# Patient Record
Sex: Female | Born: 1970 | Race: White | Hispanic: No | Marital: Married | Smoking: Former smoker
Health system: Southern US, Community
[De-identification: ages and names within clinical notes are randomized; demographics above are authoritative.]

## PROBLEM LIST (undated history)

## (undated) DIAGNOSIS — M545 Low back pain, unspecified: Secondary | ICD-10-CM

## (undated) DIAGNOSIS — M5481 Occipital neuralgia: Secondary | ICD-10-CM

## (undated) DIAGNOSIS — F419 Anxiety disorder, unspecified: Secondary | ICD-10-CM

## (undated) DIAGNOSIS — R0600 Dyspnea, unspecified: Secondary | ICD-10-CM

## (undated) DIAGNOSIS — J329 Chronic sinusitis, unspecified: Secondary | ICD-10-CM

## (undated) DIAGNOSIS — J438 Other emphysema: Secondary | ICD-10-CM

## (undated) DIAGNOSIS — K859 Acute pancreatitis without necrosis or infection, unspecified: Secondary | ICD-10-CM

## (undated) DIAGNOSIS — H9202 Otalgia, left ear: Secondary | ICD-10-CM

## (undated) DIAGNOSIS — R102 Pelvic and perineal pain unspecified side: Secondary | ICD-10-CM

## (undated) DIAGNOSIS — R200 Anesthesia of skin: Secondary | ICD-10-CM

## (undated) DIAGNOSIS — I700 Atherosclerosis of aorta: Secondary | ICD-10-CM

## (undated) DIAGNOSIS — E785 Hyperlipidemia, unspecified: Secondary | ICD-10-CM

## (undated) DIAGNOSIS — K760 Fatty (change of) liver, not elsewhere classified: Secondary | ICD-10-CM

## (undated) DIAGNOSIS — J449 Chronic obstructive pulmonary disease, unspecified: Secondary | ICD-10-CM

## (undated) DIAGNOSIS — J189 Pneumonia, unspecified organism: Secondary | ICD-10-CM

## (undated) DIAGNOSIS — F509 Eating disorder, unspecified: Secondary | ICD-10-CM

## (undated) DIAGNOSIS — F5104 Psychophysiologic insomnia: Secondary | ICD-10-CM

## (undated) DIAGNOSIS — D649 Anemia, unspecified: Secondary | ICD-10-CM

## (undated) DIAGNOSIS — H7293 Unspecified perforation of tympanic membrane, bilateral: Secondary | ICD-10-CM

## (undated) DIAGNOSIS — H538 Other visual disturbances: Secondary | ICD-10-CM

## (undated) DIAGNOSIS — H9190 Unspecified hearing loss, unspecified ear: Secondary | ICD-10-CM

## (undated) DIAGNOSIS — E039 Hypothyroidism, unspecified: Secondary | ICD-10-CM

## (undated) DIAGNOSIS — R011 Cardiac murmur, unspecified: Secondary | ICD-10-CM

## (undated) DIAGNOSIS — E669 Obesity, unspecified: Secondary | ICD-10-CM

## (undated) DIAGNOSIS — I10 Essential (primary) hypertension: Secondary | ICD-10-CM

## (undated) HISTORY — DX: Otalgia, left ear: H92.02

## (undated) HISTORY — DX: Chronic sinusitis, unspecified: J32.9

## (undated) HISTORY — DX: Obesity, unspecified: E66.9

## (undated) HISTORY — PX: DENTAL SURGERY: SHX609

## (undated) HISTORY — PX: WISDOM TOOTH EXTRACTION: SHX21

## (undated) HISTORY — DX: Hyperlipidemia, unspecified: E78.5

## (undated) HISTORY — DX: Other visual disturbances: H53.8

## (undated) HISTORY — DX: Low back pain, unspecified: M54.50

## (undated) HISTORY — DX: Psychophysiologic insomnia: F51.04

## (undated) HISTORY — PX: INNER EAR SURGERY: SHX679

## (undated) HISTORY — PX: TONSILLECTOMY: SUR1361

## (undated) HISTORY — DX: Fatty (change of) liver, not elsewhere classified: K76.0

## (undated) HISTORY — DX: Unspecified perforation of tympanic membrane, bilateral: H72.93

## (undated) HISTORY — PX: ABDOMINAL HYSTERECTOMY: SHX81

## (undated) HISTORY — DX: Occipital neuralgia: M54.81

## (undated) HISTORY — DX: Anesthesia of skin: R20.0

## (undated) HISTORY — DX: Other emphysema: J43.8

## (undated) HISTORY — DX: Atherosclerosis of aorta: I70.0

## (undated) HISTORY — DX: Eating disorder, unspecified: F50.9

## (undated) HISTORY — DX: Hypothyroidism, unspecified: E03.9

## (undated) HISTORY — PX: EAR MEATOPLASTY WITH FULL THICKNESS SKIN GRAFT: SHX6486

## (undated) HISTORY — DX: Anemia, unspecified: D64.9

## (undated) HISTORY — DX: Unspecified hearing loss, unspecified ear: H91.90

## (undated) HISTORY — PX: LAPAROSCOPIC OVARIAN CYSTECTOMY: SUR786

---

## 1998-04-16 ENCOUNTER — Other Ambulatory Visit: Admission: RE | Admit: 1998-04-16 | Discharge: 1998-04-16 | Payer: Self-pay | Admitting: Obstetrics & Gynecology

## 1998-12-05 ENCOUNTER — Ambulatory Visit (HOSPITAL_BASED_OUTPATIENT_CLINIC_OR_DEPARTMENT_OTHER): Admission: RE | Admit: 1998-12-05 | Discharge: 1998-12-05 | Payer: Self-pay

## 1999-05-05 ENCOUNTER — Other Ambulatory Visit: Admission: RE | Admit: 1999-05-05 | Discharge: 1999-05-05 | Payer: Self-pay | Admitting: Obstetrics and Gynecology

## 2011-07-22 ENCOUNTER — Encounter (HOSPITAL_COMMUNITY): Payer: Self-pay | Admitting: Emergency Medicine

## 2011-07-22 ENCOUNTER — Emergency Department (HOSPITAL_COMMUNITY): Payer: No Typology Code available for payment source

## 2011-07-22 ENCOUNTER — Inpatient Hospital Stay (HOSPITAL_COMMUNITY)
Admission: EM | Admit: 2011-07-22 | Discharge: 2011-07-24 | DRG: 195 | Disposition: A | Discharge status: Home / Self Care | Payer: No Typology Code available for payment source | Attending: Internal Medicine | Admitting: Internal Medicine

## 2011-07-22 DIAGNOSIS — Z720 Tobacco use: Secondary | ICD-10-CM | POA: Diagnosis present

## 2011-07-22 DIAGNOSIS — Z23 Encounter for immunization: Secondary | ICD-10-CM

## 2011-07-22 DIAGNOSIS — G8929 Other chronic pain: Secondary | ICD-10-CM

## 2011-07-22 DIAGNOSIS — J189 Pneumonia, unspecified organism: Principal | ICD-10-CM | POA: Diagnosis present

## 2011-07-22 DIAGNOSIS — G43909 Migraine, unspecified, not intractable, without status migrainosus: Secondary | ICD-10-CM | POA: Diagnosis present

## 2011-07-22 DIAGNOSIS — A419 Sepsis, unspecified organism: Secondary | ICD-10-CM

## 2011-07-22 DIAGNOSIS — F411 Generalized anxiety disorder: Secondary | ICD-10-CM | POA: Diagnosis present

## 2011-07-22 DIAGNOSIS — N949 Unspecified condition associated with female genital organs and menstrual cycle: Secondary | ICD-10-CM | POA: Diagnosis present

## 2011-07-22 DIAGNOSIS — E876 Hypokalemia: Secondary | ICD-10-CM | POA: Diagnosis present

## 2011-07-22 HISTORY — DX: Pelvic and perineal pain unspecified side: R10.20

## 2011-07-22 HISTORY — DX: Anxiety disorder, unspecified: F41.9

## 2011-07-22 HISTORY — DX: Pelvic and perineal pain: R10.2

## 2011-07-22 LAB — POCT I-STAT, CHEM 8
Chloride: 108 mEq/L (ref 96–112)
Creatinine, Ser: 0.8 mg/dL (ref 0.50–1.10)
Glucose, Bld: 110 mg/dL — ABNORMAL HIGH (ref 70–99)
Potassium: 3.6 mEq/L (ref 3.5–5.1)

## 2011-07-22 LAB — DIFFERENTIAL
Lymphs Abs: 2.2 10*3/uL (ref 0.7–4.0)
Monocytes Absolute: 1.2 10*3/uL — ABNORMAL HIGH (ref 0.1–1.0)
Monocytes Relative: 6 % (ref 3–12)
Neutro Abs: 16.7 10*3/uL — ABNORMAL HIGH (ref 1.7–7.7)
Neutrophils Relative %: 83 % — ABNORMAL HIGH (ref 43–77)

## 2011-07-22 LAB — CBC
HCT: 39.1 % (ref 36.0–46.0)
Hemoglobin: 13.5 g/dL (ref 12.0–15.0)
MCH: 30 pg (ref 26.0–34.0)
RBC: 4.5 MIL/uL (ref 3.87–5.11)

## 2011-07-22 MED ORDER — ALBUTEROL SULFATE (5 MG/ML) 0.5% IN NEBU
5.0000 mg | INHALATION_SOLUTION | Freq: Once | RESPIRATORY_TRACT | Status: AC
  Administered 2011-07-22: 5 mg via RESPIRATORY_TRACT
  Filled 2011-07-22: qty 1

## 2011-07-22 MED ORDER — MOXIFLOXACIN HCL IN NACL 400 MG/250ML IV SOLN
400.0000 mg | Freq: Once | INTRAVENOUS | Status: AC
  Administered 2011-07-22: 400 mg via INTRAVENOUS
  Filled 2011-07-22: qty 250

## 2011-07-22 MED ORDER — IPRATROPIUM BROMIDE 0.02 % IN SOLN
0.5000 mg | Freq: Once | RESPIRATORY_TRACT | Status: AC
  Administered 2011-07-22: 0.5 mg via RESPIRATORY_TRACT
  Filled 2011-07-22: qty 2.5

## 2011-07-22 MED ORDER — SODIUM CHLORIDE 0.9 % IV BOLUS (SEPSIS)
1000.0000 mL | Freq: Once | INTRAVENOUS | Status: AC
  Administered 2011-07-22: 1000 mL via INTRAVENOUS

## 2011-07-22 NOTE — ED Provider Notes (Signed)
History     CSN: [id number]  Arrival date & time 07/22/11  1905   First MD Initiated Contact with Patient 07/22/11 2258      Chief Complaint  Patient presents with  . Shortness of Breath    (Consider location/radiation/quality/duration/timing/severity/associated sxs/prior treatment) HPI Comments: 41 year old female with a history of migraine disorder and chronic pelvic pain who presents with coughing. She started coughing 3 days ago, gradual in onset, persistent, gradually getting worse and associated with thick phlegm production. She does have associated bilateral sharp chest pains when she coughs and a headache when she coughs. She has had several episodes of vomiting throughout the day, fever to 101 and presented initially to an urgent care where an x-ray was done showing pneumonia. She was referred here for further evaluation workup and treatment. According to the patient she has a normal immune system, no history of HIV, steroid use or frequent infections.  Patient is a 42 y.o. female presenting with shortness of breath. The history is provided by the patient, the spouse and medical records.  Shortness of Breath  Associated symptoms include shortness of breath.    Past Medical History  Diagnosis Date  . Anxiety   . Migraine   . Pelvic pain     Past Surgical History  Procedure Date  . Abdominal hysterectomy     History reviewed. No pertinent family history.  History  Substance Use Topics  . Smoking status: Former Games developer  . Smokeless tobacco: Not on file  . Alcohol Use: No    OB History    Grav Para Term Preterm Abortions TAB SAB Ect Mult Living                  Review of Systems  Respiratory: Positive for shortness of breath.   All other systems reviewed and are negative.    Allergies  Penicillins  Home Medications   Current Outpatient Rx  Name Route Sig Dispense Refill  . CLONAZEPAM 0.5 MG PO TABS Oral Take 0.5 mg by mouth 3 (three) times daily as  needed. For anxiety    . DULOXETINE HCL 60 MG PO CPEP Oral Take 60 mg by mouth daily.    Marland Kitchen GABAPENTIN 600 MG PO TABS Oral Take 1,200 mg by mouth 3 (three) times daily.    Marland Kitchen HYDROCODONE-ACETAMINOPHEN 7.5-750 MG PO TABS Oral Take 1 tablet by mouth 2 (two) times daily.    . IMIPRAMINE PAMOATE 100 MG PO CAPS Oral Take 100 mg by mouth 2 (two) times daily.    Marland Kitchen OLANZAPINE 10 MG PO TABS Oral Take 10 mg by mouth daily as needed.    Marland Kitchen ZONISAMIDE 100 MG PO CAPS Oral Take 200 mg by mouth every evening.      BP 126/75  Pulse 109  Temp(Src) 99.3 F (37.4 C) (Oral)  Resp 33  SpO2 95%  Physical Exam  Nursing note and vitals reviewed. Constitutional: She appears well-developed and well-nourished. No distress.  HENT:  Head: Normocephalic and atraumatic.  Mouth/Throat: Oropharynx is clear and moist. No oropharyngeal exudate.  Eyes: Conjunctivae and EOM are normal. Pupils are equal, round, and reactive to light. Right eye exhibits no discharge. Left eye exhibits no discharge. No scleral icterus.  Neck: Normal range of motion. Neck supple. No JVD present. No thyromegaly present.  Cardiovascular: Normal rate, regular rhythm, normal heart sounds and intact distal pulses.  Exam reveals no gallop and no friction rub.   No murmur heard. Pulmonary/Chest: She has wheezes. She has  rales.       Respiratory rate 25 on evaluation, wheezing diffusely bilateral lungs on expiration, speaks in full sentences, unable to breathe deeply without significant coughing  Abdominal: Soft. Bowel sounds are normal. She exhibits no distension and no mass. There is no tenderness.  Musculoskeletal: Normal range of motion. She exhibits no edema and no tenderness.  Lymphadenopathy:    She has no cervical adenopathy.  Neurological: She is alert. Coordination normal.  Skin: Skin is warm and dry. No rash noted. No erythema.  Psychiatric: She has a normal mood and affect. Her behavior is normal.    ED Course  Procedures (including  critical care time)  Labs Reviewed  CBC - Abnormal; Notable for the following:    WBC 20.2 (*)    All other components within normal limits  DIFFERENTIAL - Abnormal; Notable for the following:    Neutrophils Relative 83 (*)    Neutro Abs 16.7 (*)    Lymphocytes Relative 11 (*)    Monocytes Absolute 1.2 (*)    All other components within normal limits  POCT I-STAT, CHEM 8 - Abnormal; Notable for the following:    BUN <3 (*)    Glucose, Bld 110 (*)    All other components within normal limits   Dg Chest 2 View  07/22/2011  *RADIOLOGY REPORT*  Clinical Data:   Dizziness and shortness of breath for 3 days  CHEST - 2 VIEW  Comparison: None.  Findings: Bilateral airspace disease is present, widespread, consistent with pneumonia.  Heart size is normal.  There is no effusion or pneumothorax.  The bones are unremarkable.  IMPRESSION: Widespread bilateral airspace disease consistent with pneumonia.  Original Report Authenticated By: Elsie Stain, M.D.     1. CAP (community acquired pneumonia)   2. Sepsis       MDM  Oxygen patient is 93% on room air, respirations showed tachypnea, tachycardia to 110, fever at home of 101. Will proceed with laboratory workup.  Mental status normal - O2 levels low at 92%  Chest x-ray according to my interpretation shows bilateral infiltrates consistent with multifocal pneumonia. Avelox has been ordered prior to my arrival to shift.   Patient has been reevaluated several times and after albuterol treatment has persistent hypoxia with oxygen levels of 90-93%. Her respiratory rate is persistently elevated at 24-26 and her white blood cell count has now come back at 20,000 with significant left shift. Due to her symptom complex of fever, leukocytosis, tachycardiac, hypoxia and presence of infiltrates on her chest x-ray she does meet criteria for sepsis though does appear to be early and does not appear to be in shock. IV fluids ordered, intravenous antibiotics of  moxifloxacin, consultation with hospitalist was obtained and additional orders given.  CRITICAL CARE Performed by: Vida Roller   Total critical care time: 35  Critical care time was exclusive of separately billable procedures and treating other patients.  Critical care was necessary to treat or prevent imminent or life-threatening deterioration.  Critical care was time spent personally by me on the following activities: development of treatment plan with patient and/or surrogate as well as nursing, discussions with consultants, evaluation of patient's response to treatment, examination of patient, obtaining history from patient or surrogate, ordering and performing treatments and interventions, ordering and review of laboratory studies, ordering and review of radiographic studies, pulse oximetry and re-evaluation of patient's condition.      Vida Roller, MD 07/23/11 [phone]

## 2011-07-22 NOTE — ED Notes (Addendum)
Pt was seen at an urgent care and had a chest x-ray done which showed bilateral infiltrates. Pt noted to have some SOB with O2 sats of 93% on RA. Pt is speaking full sentences without difficulty. Pt has CD of CXR with her from urgent care.

## 2011-07-23 ENCOUNTER — Encounter (HOSPITAL_COMMUNITY): Payer: Self-pay | Admitting: Internal Medicine

## 2011-07-23 DIAGNOSIS — Z720 Tobacco use: Secondary | ICD-10-CM | POA: Diagnosis present

## 2011-07-23 DIAGNOSIS — J189 Pneumonia, unspecified organism: Secondary | ICD-10-CM | POA: Diagnosis present

## 2011-07-23 LAB — CBC
MCH: 29.3 pg (ref 26.0–34.0)
MCV: 86.2 fL (ref 78.0–100.0)
Platelets: 270 10*3/uL (ref 150–400)
RDW: 14.1 % (ref 11.5–15.5)

## 2011-07-23 LAB — COMPREHENSIVE METABOLIC PANEL
ALT: 16 U/L (ref 0–35)
AST: 19 U/L (ref 0–37)
Albumin: 3.3 g/dL — ABNORMAL LOW (ref 3.5–5.2)
Alkaline Phosphatase: 106 U/L (ref 39–117)
Chloride: 109 mEq/L (ref 96–112)
Potassium: 3.2 mEq/L — ABNORMAL LOW (ref 3.5–5.1)
Total Bilirubin: 0.2 mg/dL — ABNORMAL LOW (ref 0.3–1.2)

## 2011-07-23 LAB — INFLUENZA PANEL BY PCR (TYPE A & B): H1N1 flu by pcr: NOT DETECTED

## 2011-07-23 LAB — STREP PNEUMONIAE URINARY ANTIGEN: Strep Pneumo Urinary Antigen: POSITIVE — AB

## 2011-07-23 LAB — DIFFERENTIAL
Basophils Absolute: 0 10*3/uL (ref 0.0–0.1)
Eosinophils Absolute: 0 10*3/uL (ref 0.0–0.7)
Eosinophils Relative: 0 % (ref 0–5)

## 2011-07-23 LAB — MRSA PCR SCREENING: MRSA by PCR: NEGATIVE

## 2011-07-23 MED ORDER — SODIUM CHLORIDE 0.9 % IJ SOLN
3.0000 mL | Freq: Two times a day (BID) | INTRAMUSCULAR | Status: DC
  Administered 2011-07-23 (×2): 3 mL via INTRAVENOUS

## 2011-07-23 MED ORDER — GABAPENTIN 600 MG PO TABS
1200.0000 mg | ORAL_TABLET | Freq: Three times a day (TID) | ORAL | Status: DC
  Administered 2011-07-23 – 2011-07-24 (×4): 1200 mg via ORAL
  Filled 2011-07-23 (×6): qty 2

## 2011-07-23 MED ORDER — ACETAMINOPHEN 325 MG PO TABS: 650.0000 mg | ORAL_TABLET | Freq: Four times a day (QID) | ORAL | Status: DC | PRN

## 2011-07-23 MED ORDER — CLONAZEPAM 0.5 MG PO TABS: 0.5000 mg | ORAL_TABLET | Freq: Three times a day (TID) | ORAL | Status: DC | PRN

## 2011-07-23 MED ORDER — ENOXAPARIN SODIUM 40 MG/0.4ML ~~LOC~~ SOLN
40.0000 mg | SUBCUTANEOUS | Status: DC
  Administered 2011-07-23: 40 mg via SUBCUTANEOUS
  Filled 2011-07-23 (×2): qty 0.4

## 2011-07-23 MED ORDER — ACETAMINOPHEN 650 MG RE SUPP: 650.0000 mg | Freq: Four times a day (QID) | RECTAL | Status: DC | PRN

## 2011-07-23 MED ORDER — INFLUENZA VIRUS VACC SPLIT PF IM SUSP
0.5000 mL | INTRAMUSCULAR | Status: AC
  Administered 2011-07-24: 0.5 mL via INTRAMUSCULAR
  Filled 2011-07-23: qty 0.5

## 2011-07-23 MED ORDER — SODIUM CHLORIDE 0.9 % IV SOLN
INTRAVENOUS | Status: DC
  Administered 2011-07-23 (×2): via INTRAVENOUS

## 2011-07-23 MED ORDER — POTASSIUM CHLORIDE CRYS ER 20 MEQ PO TBCR
40.0000 meq | EXTENDED_RELEASE_TABLET | Freq: Once | ORAL | Status: AC
  Administered 2011-07-23: 40 meq via ORAL
  Filled 2011-07-23: qty 2

## 2011-07-23 MED ORDER — IMIPRAMINE PAMOATE 100 MG PO CAPS
100.0000 mg | ORAL_CAPSULE | Freq: Two times a day (BID) | ORAL | Status: DC
  Administered 2011-07-23 – 2011-07-24 (×3): 100 mg via ORAL
  Filled 2011-07-23 (×4): qty 1

## 2011-07-23 MED ORDER — DULOXETINE HCL 60 MG PO CPEP
60.0000 mg | ORAL_CAPSULE | Freq: Every day | ORAL | Status: DC
  Administered 2011-07-23 – 2011-07-24 (×2): 60 mg via ORAL
  Filled 2011-07-23 (×2): qty 1

## 2011-07-23 MED ORDER — ALBUTEROL SULFATE (5 MG/ML) 0.5% IN NEBU
2.5000 mg | INHALATION_SOLUTION | Freq: Four times a day (QID) | RESPIRATORY_TRACT | Status: DC
  Administered 2011-07-23 (×2): 2.5 mg via RESPIRATORY_TRACT
  Filled 2011-07-23 (×2): qty 0.5

## 2011-07-23 MED ORDER — ONDANSETRON HCL 4 MG PO TABS: 4.0000 mg | ORAL_TABLET | Freq: Four times a day (QID) | ORAL | Status: DC | PRN

## 2011-07-23 MED ORDER — MOXIFLOXACIN HCL IN NACL 400 MG/250ML IV SOLN
400.0000 mg | INTRAVENOUS | Status: DC
  Administered 2011-07-23: 400 mg via INTRAVENOUS
  Filled 2011-07-23 (×2): qty 250

## 2011-07-23 MED ORDER — ZONISAMIDE 100 MG PO CAPS
200.0000 mg | ORAL_CAPSULE | Freq: Every evening | ORAL | Status: DC
  Administered 2011-07-23: 200 mg via ORAL
  Filled 2011-07-23 (×3): qty 2

## 2011-07-23 MED ORDER — HYDROCODONE-ACETAMINOPHEN 5-325 MG PO TABS
1.5000 | ORAL_TABLET | Freq: Four times a day (QID) | ORAL | Status: DC | PRN
  Administered 2011-07-23 – 2011-07-24 (×3): 1.5 via ORAL
  Filled 2011-07-23 (×3): qty 2

## 2011-07-23 MED ORDER — ONDANSETRON HCL 4 MG/2ML IJ SOLN: 4.0000 mg | Freq: Four times a day (QID) | INTRAMUSCULAR | Status: DC | PRN

## 2011-07-23 MED ORDER — ALBUTEROL SULFATE (5 MG/ML) 0.5% IN NEBU
2.5000 mg | INHALATION_SOLUTION | RESPIRATORY_TRACT | Status: DC | PRN
  Administered 2011-07-23: 2.5 mg via RESPIRATORY_TRACT
  Filled 2011-07-23: qty 0.5

## 2011-07-23 MED ORDER — OLANZAPINE 10 MG PO TABS
10.0000 mg | ORAL_TABLET | Freq: Every day | ORAL | Status: DC | PRN
  Filled 2011-07-23: qty 1

## 2011-07-23 MED ORDER — PNEUMOCOCCAL VAC POLYVALENT 25 MCG/0.5ML IJ INJ
0.5000 mL | INJECTION | INTRAMUSCULAR | Status: AC
  Administered 2011-07-24: 0.5 mL via INTRAMUSCULAR
  Filled 2011-07-23: qty 0.5

## 2011-07-23 NOTE — Progress Notes (Signed)
Utilization review completed.  

## 2011-07-23 NOTE — Progress Notes (Signed)
Report given to receiving RN and pt transferred to 5500. VSS. Pt alert and oriented x4. Kendra Parker

## 2011-07-23 NOTE — H&P (Signed)
Kendra Parker is an 41 y.o. female.   PCP - None Follows at [facility] for Chronic pelvic pain. Chief Complaint: Shortness of breath and cough. HPI: 41 year-old female with history of chronic pelvic pain and migraine and ongoing tobacco abuse presented to the ER because of shortness of breath with cough over last 3 days which was worsening. In the ER patient was found to be short of breath tachypneic and chest x-ray showed bilateral infiltrates. At this time patient will be admitted for pneumonia. Patient has some pleuritic chest pain when she takes deep breath or coughs denies any nausea vomiting abdominal pain diarrhea dizziness or loss of consciousness. Patient has not had pneumonia in the recent past. Her colleague at work place also has bronchitis diagnosed 3 days ago.  Past Medical History  Diagnosis Date  . Anxiety   . Migraine   . Pelvic pain     Past Surgical History  Procedure Date  . Abdominal hysterectomy     Family History  Problem Relation Age of Onset  . Multiple myeloma Mother    Social History:  reports that she has quit smoking. She does not have any smokeless tobacco history on file. She reports that she does not drink alcohol or use illicit drugs.  Allergies:  Allergies  Allergen Reactions  . Penicillins Rash    Medications Prior to Admission  Medication Dose Route Frequency Provider Last Rate Last Dose  . albuterol (PROVENTIL) (5 MG/ML) 0.5% nebulizer solution 5 mg  5 mg Nebulization Once Harrold Donath R. Pickering, MD   5 mg at 07/22/11 2307  . ipratropium (ATROVENT) nebulizer solution 0.5 mg  0.5 mg Nebulization Once Harrold Donath R. Pickering, MD   0.5 mg at 07/22/11 2307  . moxifloxacin (AVELOX) IVPB 400 mg  400 mg Intravenous Once [employer]. Pickering, MD   400 mg at 07/22/11 2317  . sodium chloride 0.9 % bolus 1,000 mL  1,000 mL Intravenous Once Harrold Donath R. Pickering, MD   1,000 mL at 07/22/11 2317   No current outpatient prescriptions on file as of 07/22/2011.    Results for  orders placed during the hospital encounter of 07/22/11 (from the past 48 hour(s))  CBC     Status: Abnormal   Collection Time   07/22/11 10:51 PM      Component Value Range Comment   WBC 20.2 (*) 4.0 - 10.5 (K/uL)    RBC 4.50  3.87 - 5.11 (MIL/uL)    Hemoglobin 13.5  12.0 - 15.0 (g/dL)    HCT 91.4  78.2 - 95.6 (%)    MCV 86.9  78.0 - 100.0 (fL)    MCH 30.0  26.0 - 34.0 (pg)    MCHC 34.5  30.0 - 36.0 (g/dL)    RDW 21.3  08.6 - 57.8 (%)    Platelets 284  150 - 400 (K/uL)   DIFFERENTIAL     Status: Abnormal   Collection Time   07/22/11 10:51 PM      Component Value Range Comment   Neutrophils Relative 83 (*) 43 - 77 (%)    Neutro Abs 16.7 (*) 1.7 - 7.7 (K/uL)    Lymphocytes Relative 11 (*) 12 - 46 (%)    Lymphs Abs 2.2  0.7 - 4.0 (K/uL)    Monocytes Relative 6  3 - 12 (%)    Monocytes Absolute 1.2 (*) 0.1 - 1.0 (K/uL)    Eosinophils Relative 0  0 - 5 (%)    Eosinophils Absolute 0.0  0.0 - 0.7 (K/uL)    Basophils Relative 0  0 - 1 (%)    Basophils Absolute 0.0  0.0 - 0.1 (K/uL)   POCT I-STAT, CHEM 8     Status: Abnormal   Collection Time   07/22/11 11:02 PM      Component Value Range Comment   Sodium 139  135 - 145 (mEq/L)    Potassium 3.6  3.5 - 5.1 (mEq/L)    Chloride 108  96 - 112 (mEq/L)    BUN <3 (*) 6 - 23 (mg/dL)    Creatinine, Ser 5.40  0.50 - 1.10 (mg/dL)    Glucose, Bld 981 (*) 70 - 99 (mg/dL)    Calcium, Ion 1.91  1.12 - 1.32 (mmol/L)    TCO2 19  0 - 100 (mmol/L)    Hemoglobin 13.9  12.0 - 15.0 (g/dL)    HCT 47.8  29.5 - 62.1 (%)    Dg Chest 2 View  07/22/2011  *RADIOLOGY REPORT*  Clinical Data:   Dizziness and shortness of breath for 3 days  CHEST - 2 VIEW  Comparison: None.  Findings: Bilateral airspace disease is present, widespread, consistent with pneumonia.  Heart size is normal.  There is no effusion or pneumothorax.  The bones are unremarkable.  IMPRESSION: Widespread bilateral airspace disease consistent with pneumonia.  Original Report Authenticated By: Elsie Stain, M.D.    Review of Systems  Constitutional: Negative.   HENT: Negative.   Eyes: Negative.   Respiratory: Positive for cough and shortness of breath.   Cardiovascular: Negative.   Gastrointestinal: Negative.   Genitourinary: Negative.   Musculoskeletal: Negative.   Skin: Negative.   Neurological: Negative.   Endo/Heme/Allergies: Negative.   Psychiatric/Behavioral: Negative.     Blood pressure 126/75, pulse 109, temperature 99.3 F (37.4 C), temperature source Oral, resp. rate 33, SpO2 95.00%. Physical Exam  Constitutional: She is oriented to person, place, and time. She appears well-developed and well-nourished.  HENT:  Head: Normocephalic and atraumatic.  Right Ear: External ear normal.  Left Ear: External ear normal.  Nose: Nose normal.  Mouth/Throat: Oropharynx is clear and moist. No oropharyngeal exudate.  Eyes: Conjunctivae are normal. Pupils are equal, round, and reactive to light. Right eye exhibits no discharge. Left eye exhibits no discharge. No scleral icterus.  Neck: Normal range of motion. Neck supple.  Cardiovascular: Normal rate, regular rhythm and normal heart sounds.   Respiratory: Effort normal. She has wheezes. She has rales.  GI: Soft. Bowel sounds are normal. She exhibits no distension. There is no tenderness. There is no rebound.  Musculoskeletal: Normal range of motion. She exhibits no edema and no tenderness.  Neurological: She is alert and oriented to person, place, and time. She has normal reflexes.  Skin: Skin is warm and dry.  Psychiatric: Her behavior is normal.     Assessment/Plan #1. Pneumonia - as patient is taking shallow breaths and tachypneic we will observe her in step down overnight. Treat with Avelox as community-acquired pneumonia. Check for flu. #2. Tobacco abuse - tobacco cessation counseling requested. #3. History of migraine and chronic pelvic pain and anxiety - continue present medications.  CODE STATUS - full  code.  Clayton Bosserman N. 07/23/2011, 1:48 AM

## 2011-07-23 NOTE — Progress Notes (Signed)
FLU PCR NEGATIVE, DROPLET ISOLATION D/CED.

## 2011-07-23 NOTE — Progress Notes (Addendum)
Subjective:   Patient seen and examined this morning. informs her breathing to be better.   Objective:  Vital signs in last 24 hours:  Filed Vitals:   07/23/11 0800 07/23/11 1200 07/23/11 1218 07/23/11 1222  BP: 135/96 141/88    Pulse: 94 91    Temp:    97.4 F (36.3 C)  TempSrc:    Oral  Resp: 26 24    Height:      Weight:      SpO2: 94% 94% 99%     Intake/Output from previous day:   Intake/Output Summary (Last 24 hours) at 07/23/11 1429 Last data filed at 07/23/11 1000  Gross per 24 hour  Intake 711.67 ml  Output    300 ml  Net 411.67 ml    Physical Exam:  General: middle aged female  in no acute distress. HEENT: no pallor, no icterus, moist oral mucosa, no JVD, no lymphadenopathy Heart: Normal  s1 &s2  Regular rate and rhythm, without murmurs, rubs, gallops. Lungs: equal air entry  Bilaterally, scattered ronchi Abdomen: Soft, nontender, nondistended, positive bowel sounds. Extremities: No clubbing cyanosis or edema with positive pedal pulses. Neuro: Alert, awake, oriented x3, nonfocal.   Lab Results:  Basic Metabolic Panel:    Component Value Date/Time   NA 137 07/23/2011 0625   K 3.2* 07/23/2011 0625   CL 109 07/23/2011 0625   CO2 17* 07/23/2011 0625   BUN 4* 07/23/2011 0625   CREATININE 0.59 07/23/2011 0625   GLUCOSE 103* 07/23/2011 0625   CALCIUM 9.2 07/23/2011 0625   CBC:    Component Value Date/Time   WBC 15.8* 07/23/2011 0625   HGB 12.1 07/23/2011 0625   HCT 35.6* 07/23/2011 0625   PLT 270 07/23/2011 0625   MCV 86.2 07/23/2011 0625   NEUTROABS 12.8* 07/23/2011 0625   LYMPHSABS 2.0 07/23/2011 0625   MONOABS 1.0 07/23/2011 0625   EOSABS 0.0 07/23/2011 0625   BASOSABS 0.0 07/23/2011 0625    Recent Results (from the past 240 hour(s))  MRSA PCR SCREENING     Status: Normal   Collection Time   07/23/11  3:06 AM      Component Value Range Status Comment   MRSA by PCR NEGATIVE  NEGATIVE  Final     Studies/Results: Dg Chest 2 View  07/22/2011  *RADIOLOGY REPORT*  Clinical  Data:   Dizziness and shortness of breath for 3 days  CHEST - 2 VIEW  Comparison: None.  Findings: Bilateral airspace disease is present, widespread, consistent with pneumonia.  Heart size is normal.  There is no effusion or pneumothorax.  The bones are unremarkable.  IMPRESSION: Widespread bilateral airspace disease consistent with pneumonia.  Original Report Authenticated By: Elsie Stain, M.D.    Medications: Scheduled Meds:   . albuterol  2.5 mg Nebulization Q6H  . albuterol  5 mg Nebulization Once  . DULoxetine  60 mg Oral Daily  . enoxaparin  40 mg Subcutaneous Q24H  . gabapentin  1,200 mg Oral TID  . imipramine  100 mg Oral BID  . influenza  inactive virus vaccine  0.5 mL Intramuscular Tomorrow-1000  . ipratropium  0.5 mg Nebulization Once  . moxifloxacin  400 mg Intravenous Once  . moxifloxacin  400 mg Intravenous Q24H  . pneumococcal 23 valent vaccine  0.5 mL Intramuscular Tomorrow-1000  . potassium chloride  40 mEq Oral Once  . sodium chloride  1,000 mL Intravenous Once  . sodium chloride  3 mL Intravenous Q12H  . zonisamide  200 mg Oral QPM   Continuous Infusions:   . sodium chloride 50 mL/hr at 07/23/11 0410   PRN Meds:.acetaminophen, acetaminophen, albuterol, clonazePAM, HYDROcodone-acetaminophen, OLANZapine, ondansetron (ZOFRAN) IV, ondansetron  Assessment/ 41 y/o female with hx of active smoking admitted with pneumonia.       Plan:  *Community acquired strep pneumonia Urine strep ag positive patient on avelox and will continue ( day 1)  Cont prn nebs  rapid flu negative Can transfer out to medical floor  Hypokalemia Replenished   Tobacco abuse Counseled on smoking cessation  Diet:  Regular  DVT prophylaxis  Full code   LOS: 1 day   Kendra Parker 07/23/2011, 2:29 PM

## 2011-07-24 ENCOUNTER — Encounter (HOSPITAL_COMMUNITY): Payer: Self-pay | Admitting: Emergency Medicine

## 2011-07-24 DIAGNOSIS — G8929 Other chronic pain: Secondary | ICD-10-CM

## 2011-07-24 DIAGNOSIS — R102 Pelvic and perineal pain: Secondary | ICD-10-CM

## 2011-07-24 DIAGNOSIS — G43909 Migraine, unspecified, not intractable, without status migrainosus: Secondary | ICD-10-CM | POA: Diagnosis present

## 2011-07-24 LAB — CBC
HCT: 36 % (ref 36.0–46.0)
Hemoglobin: 11.9 g/dL — ABNORMAL LOW (ref 12.0–15.0)
MCHC: 33.1 g/dL (ref 30.0–36.0)
MCV: 87.6 fL (ref 78.0–100.0)
RDW: 14.4 % (ref 11.5–15.5)

## 2011-07-24 MED ORDER — MOXIFLOXACIN HCL 400 MG PO TABS: 400.0000 mg | ORAL_TABLET | Freq: Every day | ORAL | Status: AC

## 2011-07-24 MED ORDER — ALBUTEROL SULFATE (5 MG/ML) 0.5% IN NEBU: 2.5000 mg | INHALATION_SOLUTION | Freq: Four times a day (QID) | RESPIRATORY_TRACT | Status: DC | PRN

## 2011-07-24 NOTE — Progress Notes (Signed)
[name] discharged Home per MD order.  Discharge instructions reviewed and discussed with the patient, all questions and concerns answered. Copy of instructions and scripts given to patient.   Kendra Parker, Kendra Parker  Home Medication Instructions [id number]   Printed on:07/24/11 1438  Medication Information                    HYDROcodone-acetaminophen (VICODIN ES) 7.5-750 MG per tablet Take 1 tablet by mouth 2 (two) times daily.           clonazePAM (KLONOPIN) 0.5 MG tablet Take 0.5 mg by mouth 3 (three) times daily as needed. For anxiety           gabapentin (NEURONTIN) 600 MG tablet Take 1,200 mg by mouth 3 (three) times daily.           imipramine (TOFRANIL-PM) 100 MG capsule Take 100 mg by mouth 2 (two) times daily.           DULoxetine (CYMBALTA) 60 MG capsule Take 60 mg by mouth daily.           OLANZapine (ZYPREXA) 10 MG tablet Take 10 mg by mouth daily as needed.           zonisamide (ZONEGRAN) 100 MG capsule Take 200 mg by mouth every evening.           QUEtiapine (SEROQUEL) 50 MG tablet Take 50-150 mg by mouth at bedtime as needed.           imipramine (TOFRANIL) 50 MG tablet Take 100 mg by mouth at bedtime.           escitalopram (LEXAPRO) 10 MG tablet Take 20 mg by mouth daily.           OXcarbazepine (TRILEPTAL) 150 MG tablet Take 150 mg by mouth 2 (two) times daily.           moxifloxacin (AVELOX) 400 MG tablet Take 1 tablet (400 mg total) by mouth daily.             Patients skin is clean, dry and intact, no evidence of skin break down. IV site discontinued and catheter remains intact. Site without signs and symptoms of complications. Dressing and pressure applied.  Patient escorted to car by NT in a wheelchair,  no distress noted upon discharge.  [name] [facility] 07/24/2011 2:38 PM

## 2011-07-24 NOTE — Discharge Summary (Signed)
Patient ID: Kendra Parker MRN: [id number] DOB/AGE: 43-Jul-1972 43 y.o.  Admit date: 07/22/2011 Discharge date: 07/24/2011  Primary Care Physician:  Mady Gemma, [place], PA-C  Discharge Diagnoses:     Principal Problem:  *Community acquired strep pneumonia  Active Problems:  Tobacco abuse  Migraine  Chronic pelvic pain in female   Medication List  As of 07/24/2011 11:44 AM   TAKE these medications         clonazePAM 0.5 MG tablet   Commonly known as: KLONOPIN   Take 0.5 mg by mouth 3 (three) times daily as needed. For anxiety      DULoxetine 60 MG capsule   Commonly known as: CYMBALTA   Take 60 mg by mouth daily.      escitalopram 10 MG tablet   Commonly known as: LEXAPRO   Take 20 mg by mouth daily.      gabapentin 600 MG tablet   Commonly known as: NEURONTIN   Take 1,200 mg by mouth 3 (three) times daily.      HYDROcodone-acetaminophen 7.5-750 MG per tablet   Commonly known as: VICODIN ES   Take 1 tablet by mouth 2 (two) times daily.      imipramine 100 MG capsule   Commonly known as: TOFRANIL-PM   Take 100 mg by mouth 2 (two) times daily.      imipramine 50 MG tablet   Commonly known as: TOFRANIL   Take 100 mg by mouth at bedtime.      moxifloxacin 400 MG tablet   Commonly known as: AVELOX   Take 1 tablet (400 mg total) by mouth daily.      OLANZapine 10 MG tablet   Commonly known as: ZYPREXA   Take 10 mg by mouth daily as needed.      OXcarbazepine 150 MG tablet   Commonly known as: TRILEPTAL   Take 150 mg by mouth 2 (two) times daily.      QUEtiapine 50 MG tablet   Commonly known as: SEROQUEL   Take 50-150 mg by mouth at bedtime as needed.      zonisamide 100 MG capsule   Commonly known as: ZONEGRAN   Take 200 mg by mouth every evening.            Disposition and Follow-up:  Follow up with PCP in 1 week  Consults:  none  Significant Diagnostic Studies:  Dg Chest 2 View  07/22/2011  *RADIOLOGY REPORT*  Clinical Data:   Dizziness and  shortness of breath for 3 days  CHEST - 2 VIEW  Comparison: None.  Findings: Bilateral airspace disease is present, widespread, consistent with pneumonia.  Heart size is normal.  There is no effusion or pneumothorax.  The bones are unremarkable.  IMPRESSION: Widespread bilateral airspace disease consistent with pneumonia.  Original Report Authenticated By: Elsie Stain, M.D.    Brief H and P: For complete details please refer to admission H and P, but in brief 41 year-old female with history of chronic pelvic pain and migraine and ongoing tobacco abuse presented to the ER because of shortness of breath with cough over last 3 days which was worsening. In the ER patient was found to be short of breath tachypneic and chest x-ray showed bilateral infiltrates. At this time patient will be admitted for pneumonia. Patient has some pleuritic chest pain when she takes deep breath or coughs denies any nausea vomiting abdominal pain diarrhea dizziness or loss of consciousness. Patient has not had pneumonia in the recent  past. Her colleague at work place also has bronchitis diagnosed 3 days ago.    Physical Exam on Discharge:  Filed Vitals:   07/23/11 2000 07/23/11 2056 07/23/11 2200 07/24/11 0516  BP: 130/91  117/78 114/78  Pulse: 84  96 86  Temp:   98.1 F (36.7 C) 97.3 F (36.3 C)  TempSrc:      Resp: 28  24 22   Height:   5\' 2"  (1.575 m)   Weight:   89 kg (196 lb 3.4 oz)   SpO2: 94% 96% 94% 95%     Intake/Output Summary (Last 24 hours) at 07/24/11 1144 Last data filed at 07/24/11 0900  Gross per 24 hour  Intake 1795.5 ml  Output   1125 ml  Net  670.5 ml    General: Alert, awake, oriented x3, in no acute distress. HEENT: No bruits, no goiter. Heart: Regular rate and rhythm, without murmurs, rubs, gallops. Lungs: Clear to auscultation bilaterally. Abdomen: Soft, nontender, nondistended, positive bowel sounds. Extremities: No clubbing cyanosis or edema with positive pedal pulses. Neuro:  Grossly intact, nonfocal.  CBC:    Component Value Date/Time   WBC 10.5 07/24/2011 0712   HGB 11.9* 07/24/2011 0712   HCT 36.0 07/24/2011 0712   PLT 276 07/24/2011 0712   MCV 87.6 07/24/2011 0712   NEUTROABS 12.8* 07/23/2011 0625   LYMPHSABS 2.0 07/23/2011 0625   MONOABS 1.0 07/23/2011 0625   EOSABS 0.0 07/23/2011 0625   BASOSABS 0.0 07/23/2011 0625    Basic Metabolic Panel:    Component Value Date/Time   NA 137 07/23/2011 0625   K 3.2* 07/23/2011 0625   CL 109 07/23/2011 0625   CO2 17* 07/23/2011 0625   BUN 4* 07/23/2011 0625   CREATININE 0.59 07/23/2011 0625   GLUCOSE 103* 07/23/2011 0625   CALCIUM 9.2 07/23/2011 0625    Hospital Course:  *Community acquired strep pneumonia  Urine strep ag positive  patient started on avelox and given tachypnea and mild tachycardia was monitored in stepdown for some time and transferred to medical floor.  Improved with prn nebs  rapid flu negative   Hypokalemia  Replenished   Tobacco abuse  Counseled on smoking cessation   Hx of migraine   on multiple meds at home which can be continued   Patient clinically stable for discharge with 5 more days of po avelox to complete a 7 day course     Time spent on Discharge: 45 minutes  Signed: Madelon Welsch 07/24/2011, 11:44 AM

## 2011-07-24 NOTE — Discharge Instructions (Addendum)
Pneumonia, Adult Pneumonia is an infection of the lungs. It may be caused by a germ (virus or bacteria). Some types of pneumonia can spread easily from person to person. This can happen when you cough or sneeze. HOME CARE  Only take medicine as told by your doctor.   Take your medicine (antibiotics) as told. Finish it even if you start to feel better.   Do not smoke.   You may use a vaporizer or humidifier in your room. This can help loosen thick spit (mucus).   Sleep so you are almost sitting up (semi-upright). This helps reduce coughing.   Rest.  A shot (vaccine) can help prevent pneumonia. Shots are often advised for:  People over 33 years old.   Patients on chemotherapy.   People with long-term (chronic) lung problems.   People with immune system problems.  GET HELP RIGHT AWAY IF:   You are getting worse.   You cannot control your cough, and you are losing sleep.   You cough up blood.   Your pain gets worse, even with medicine.   You have a fever.   Any of your problems are getting worse, not better.   You have shortness of breath or chest pain.  MAKE SURE YOU:   Understand these instructions.   Will watch your condition.   Will get help right away if you are not doing well or get worse.  Document Released: 10/27/2007 Document Revised: 01/20/2011 Document Reviewed: 07/31/2010 [facility] Patient Information 2012 [place], [place].

## 2011-07-25 LAB — LEGIONELLA ANTIGEN, URINE

## 2012-05-16 ENCOUNTER — Other Ambulatory Visit: Payer: Self-pay | Admitting: *Deleted

## 2015-01-01 ENCOUNTER — Encounter: Payer: Self-pay | Admitting: Neurology

## 2015-01-01 ENCOUNTER — Ambulatory Visit (INDEPENDENT_AMBULATORY_CARE_PROVIDER_SITE_OTHER): Payer: Managed Care, Other (non HMO) | Admitting: Neurology

## 2015-01-01 VITALS — BP 123/81 | HR 69 | Ht 63.0 in | Wt 214.0 lb

## 2015-01-01 DIAGNOSIS — R269 Unspecified abnormalities of gait and mobility: Secondary | ICD-10-CM

## 2015-01-01 DIAGNOSIS — R208 Other disturbances of skin sensation: Secondary | ICD-10-CM | POA: Diagnosis not present

## 2015-01-01 DIAGNOSIS — R200 Anesthesia of skin: Secondary | ICD-10-CM

## 2015-01-01 NOTE — Progress Notes (Signed)
PATIENT: Kendra Parker DOB: [birth year]  Chief Complaint  Patient presents with  . Numbness    She is reporting numbness in her right leg from her knee down to her foot.  She denies having any pain.  She has had a MRI and xray that did not offer any explanation for her symptoms.  She is able to walk unassisted but has to pay very close attention.  She has suffered multiple falls.     HISTORICAL  Kendra Parker is a 44 year old left-handed female, seen in refer by orthopedic surgeon Dr. Ron Agee and primary care physician nurse practitioner Alvester Chou for evaluation of numbness from right leg to her right foot  She has past medical history of migraine, hypertension, hyperlipidemia, chronic pelvic pain, multiple ear surgeries in the past, partial hysterectomy,  Since April 2016, she noticed fairly acute onset onset low back pain, no radiating pain, at the same time, she noticed right leg numbness below her right knee, there was no significant pain, weakness, no gait difficulty initially, no left lower extremity involvement, she denies bilateral upper extremity involvement, no bowel and bladder incontinence. She denies numbness of right upper extremity, or right face  She was evaluated by orthopedic surgeon Dr. Ron Agee in July 2016, I reviewed summarized the note,  MRI of lumbar was done in December 07 2014 at [facility], per record, mild disc bulging at L3-4, L4-5, mild facet arthropathy at L5-S1, no significant foraminal or canal stenosis to explain her lower extremity numbness   REVIEW OF SYSTEMS: Full 14 system review of systems performed and notable only for as above  ALLERGIES: Allergies  Allergen Reactions  . Lamotrigine Rash    Only at 100 mg is intolerable-gets a rash from head to toe  . Penicillins Rash    HOME MEDICATIONS: Current Outpatient Prescriptions  Medication Sig Dispense Refill  . atorvastatin (LIPITOR) 20 MG tablet Take 20 mg by mouth daily.     . Cholecalciferol (VITAMIN D3) 2000 UNITS TABS Take by mouth daily.    . clonazePAM (KLONOPIN) 0.5 MG tablet Take 0.5 mg by mouth 3 (three) times daily as needed. For anxiety    . DULoxetine (CYMBALTA) 60 MG capsule Take 60 mg by mouth daily.    Marland Kitchen JANTOVEN 5 MG tablet daily.    . metoprolol (LOPRESSOR) 50 MG tablet 2 (two) times daily.    Marland Kitchen oxyCODONE (OXY IR/ROXICODONE) 5 MG immediate release tablet 4 (four) times daily as needed.    Marland Kitchen perphenazine (TRILAFON) 4 MG tablet 3 (three) times daily.    . QUEtiapine (SEROQUEL) 50 MG tablet For a good night sleep up to 3 tabs/139m a night    . spironolactone (ALDACTONE) 50 MG tablet Take 50 mg by mouth. Take 0.5 tablet daily.    . SUMAtriptan (IMITREX) 50 MG tablet Take 50 mg by mouth.       PAST MEDICAL HISTORY: Past Medical History  Diagnosis Date  . Anxiety   . Migraine   . Pelvic pain   . Numbness     Left leg    PAST SURGICAL HISTORY: Past Surgical History  Procedure Laterality Date  . Abdominal hysterectomy      Partial  . Inner ear surgery    . Laparoscopic ovarian cystectomy      Left    FAMILY HISTORY: Family History  Problem Relation Age of Onset  . Multiple myeloma Mother   . Breast cancer Mother   . Emphysema Father  SOCIAL HISTORY:  Social History   Social History  . Marital Status: Married    Spouse Name: N/A  . Number of Children: 1  . Years of Education: HS   Occupational History  . Unemployed    Social History Main Topics  . Smoking status: Current Some Day Smoker    Types: Cigarettes, E-cigarettes  . Smokeless tobacco: Not on file     Comment: Smokes 0.25 packs/week - uses vapor  . Alcohol Use: No  . Drug Use: No  . Sexual Activity: Yes   Other Topics Concern  . Not on file   Social History Narrative   Lives at home with her husband.   Left-handed.   24 oz caffeine per day.     PHYSICAL EXAM   Filed Vitals:   01/01/15 1452  BP: 123/81  Pulse: 69  Height: 5' 3"  (1.6 m)    Weight: 214 lb (97.07 kg)    Not recorded      Body mass index is 37.92 kg/(m^2).  PHYSICAL EXAMNIATION:  Gen: NAD, conversant, well nourised, obese, well groomed                     Cardiovascular: Regular rate rhythm, no peripheral edema, warm, nontender. Eyes: Conjunctivae clear without exudates or hemorrhage Neck: Supple, no carotid bruise. Pulmonary: Clear to auscultation bilaterally   NEUROLOGICAL EXAM:  MENTAL STATUS: Speech:    Speech is normal; fluent and spontaneous with normal comprehension.  Cognition:     Orientation to time, place and person     Normal recent and remote memory     Normal Attention span and concentration     Normal Language, naming, repeating,spontaneous speech     Fund of knowledge   CRANIAL NERVES: CN II: Visual fields are full to confrontation. Fundoscopic exam is normal with sharp discs and no vascular changes. Pupils are round equal and briskly reactive to light. CN III, IV, VI: extraocular movement are normal. No ptosis. CN V: Facial sensation is intact to pinprick in all 3 divisions bilaterally. Corneal responses are intact.  CN VII: Face is symmetric with normal eye closure and smile. CN VIII: Hearing is normal to rubbing fingers CN IX, X: Palate elevates symmetrically. Phonation is normal. CN XI: Head turning and shoulder shrug are intact CN XII: Tongue is midline with normal movements and no atrophy.  MOTOR: There is no pronator drift of out-stretched arms. Muscle bulk and tone are normal. Muscle strength is normal.  REFLEXES: Reflexes are 2+ and symmetric at the biceps, triceps, knees, and ankles. Plantar responses are flexor.  SENSORY: Preserved bilateral lower extremity, toes vibratory sensation, proprioception, reported decreased pinprick at right lower extremity  COORDINATION: Rapid alternating movements and fine finger movements are intact. There is no dysmetria on finger-to-nose and heel-knee-shin.     GAIT/STANCE: Posture is normal. Gait is steady with normal steps, base, arm swing, and turning. Heel and toe walking are normal. Tandem gait is normal.  Romberg is absent.  DIAGNOSTIC DATA (LABS, IMAGING, TESTING) - I reviewed patient records, labs, notes, testing and imaging myself where available.  ASSESSMENT AND PLAN  LAKINDRA WIBLE is a 44 y.o. female   Low back pain Right leg paresthesia Frequent falling  There was no significant weakness, well-preserved reflex,  Potential localization are thoracic spine versus peripheral nervous system,  Proceed with EMG nerve conduction study  MRI of thoracic spine    Marcial Pacas, M.D. Ph.D.  [facility] [phone] 3rd  [address] [place], [place] [address] Ph: [phone] Fax: [phone]  CC: To Dr. Jeannetta Nap, Alvester Chou

## 2015-01-15 ENCOUNTER — Ambulatory Visit (INDEPENDENT_AMBULATORY_CARE_PROVIDER_SITE_OTHER): Payer: Managed Care, Other (non HMO)

## 2015-01-15 DIAGNOSIS — R200 Anesthesia of skin: Secondary | ICD-10-CM

## 2015-01-15 DIAGNOSIS — R269 Unspecified abnormalities of gait and mobility: Secondary | ICD-10-CM

## 2015-01-15 DIAGNOSIS — R208 Other disturbances of skin sensation: Secondary | ICD-10-CM

## 2015-01-17 ENCOUNTER — Telehealth: Payer: Self-pay

## 2015-01-17 NOTE — Telephone Encounter (Signed)
LVM for patient to call office to receive MRI results... Ok to inform patient of normal MRI of thoracic

## 2015-01-17 NOTE — Telephone Encounter (Signed)
Pt called back about MRI result... I relayed that MRI was normal per previous telephone note.

## 2015-01-20 ENCOUNTER — Telehealth: Payer: Self-pay | Admitting: *Deleted

## 2015-01-20 NOTE — Telephone Encounter (Signed)
Left message letting patient know her test results were normal.

## 2015-01-20 NOTE — Telephone Encounter (Signed)
-----   Message from Levert Feinstein, MD sent at 01/16/2015  7:11 PM EDT ----- Please call pt for normal MRI thoracic

## 2015-02-10 ENCOUNTER — Ambulatory Visit (INDEPENDENT_AMBULATORY_CARE_PROVIDER_SITE_OTHER): Payer: Self-pay | Admitting: Neurology

## 2015-02-10 ENCOUNTER — Ambulatory Visit (INDEPENDENT_AMBULATORY_CARE_PROVIDER_SITE_OTHER): Payer: Managed Care, Other (non HMO) | Admitting: Neurology

## 2015-02-10 DIAGNOSIS — Z0289 Encounter for other administrative examinations: Secondary | ICD-10-CM

## 2015-02-10 DIAGNOSIS — R200 Anesthesia of skin: Secondary | ICD-10-CM

## 2015-02-10 DIAGNOSIS — M545 Low back pain, unspecified: Secondary | ICD-10-CM

## 2015-02-10 DIAGNOSIS — R269 Unspecified abnormalities of gait and mobility: Secondary | ICD-10-CM

## 2015-02-10 DIAGNOSIS — R208 Other disturbances of skin sensation: Secondary | ICD-10-CM | POA: Diagnosis not present

## 2015-02-10 NOTE — Progress Notes (Signed)
She continues to complain midline low back pain,  Today's electrodiagnostic studies normal, there is no evidence of large fiber peripheral neuropathy, right lower extremity neuropathy, or right lumbosacral radiculopathy.  MRI of thoracic spine is normal  I have suggested her continue back stretching exercise,. NSAIDs, low back pain is most consistent with musculoskeletal etiology,  return to clinic in 2-3 months with nurse practitioner, if there is no significant worsening, may discharge her to primary care physician.

## 2015-02-10 NOTE — Procedures (Signed)
   NCS (NERVE CONDUCTION STUDY) WITH EMG (ELECTROMYOGRAPHY) REPORT   STUDY DATE: February 10 2015 PATIENT NAME: Kendra Parker DOB: [birth year] MRN: [id number]    TECHNOLOGIST: Gearldine Shown ELECTROMYOGRAPHER: Levert Feinstein M.D.  CLINICAL INFORMATION:  44 year old female, with history of chronic low back pain, now with right leg numbness from knee down  FINDINGS: NERVE CONDUCTION STUDY: Bilateral peroneal sensory responses were normal. Bilateral peroneal to EDB, tibial motor responses were normal. Bilateral tibial H reflexes were normal and symmetric.  NEEDLE ELECTROMYOGRAPHY: Selected needle examination was performed at right lower extremity muscles and right lumbosacral paraspinal muscles.  Needle examination of right tibialis anterior, tibialis posterior, peroneal longus, medial gastrocnemius, vastus lateralis, gluteus medius was normal.  There was no spontaneous activity at right lumbar sacral paraspinal muscles, right L4 L5 S1.  IMPRESSION:   This is a normal study. There was no electrodiagnostic evidence of large fiber peripheral neuropathy or right lumbosacral radiculopathy.  INTERPRETING PHYSICIAN:   Levert Feinstein M.D. Ph.D. [facility] [address], [address] [place], [place] [address] [phone]

## 2015-02-19 DIAGNOSIS — Z0289 Encounter for other administrative examinations: Secondary | ICD-10-CM

## 2015-04-14 ENCOUNTER — Telehealth: Payer: Self-pay | Admitting: *Deleted

## 2015-04-14 ENCOUNTER — Ambulatory Visit: Payer: Managed Care, Other (non HMO) | Admitting: Neurology

## 2015-04-14 NOTE — Telephone Encounter (Signed)
No showed follow up appt. 

## 2015-04-15 ENCOUNTER — Encounter: Payer: Self-pay | Admitting: Neurology

## 2015-05-05 DIAGNOSIS — Z0271 Encounter for disability determination: Secondary | ICD-10-CM

## 2015-07-07 ENCOUNTER — Other Ambulatory Visit: Payer: Self-pay | Admitting: Adult Health

## 2015-07-07 DIAGNOSIS — R292 Abnormal reflex: Secondary | ICD-10-CM

## 2015-07-07 DIAGNOSIS — M545 Low back pain: Secondary | ICD-10-CM

## 2015-07-13 ENCOUNTER — Other Ambulatory Visit: Payer: PRIVATE HEALTH INSURANCE

## 2015-07-20 ENCOUNTER — Other Ambulatory Visit: Payer: PRIVATE HEALTH INSURANCE

## 2015-08-13 ENCOUNTER — Ambulatory Visit
Admission: RE | Admit: 2015-08-13 | Discharge: 2015-08-13 | Disposition: A | Discharge status: Home / Self Care | Payer: Managed Care, Other (non HMO) | Source: Ambulatory Visit | Attending: Adult Health | Admitting: Adult Health

## 2015-08-13 ENCOUNTER — Other Ambulatory Visit: Payer: PRIVATE HEALTH INSURANCE

## 2015-08-13 DIAGNOSIS — R292 Abnormal reflex: Secondary | ICD-10-CM

## 2015-08-13 DIAGNOSIS — M545 Low back pain: Secondary | ICD-10-CM

## 2015-10-21 ENCOUNTER — Other Ambulatory Visit: Payer: Self-pay | Admitting: Adult Health

## 2015-10-21 DIAGNOSIS — Z87898 Personal history of other specified conditions: Secondary | ICD-10-CM

## 2015-10-21 DIAGNOSIS — R519 Headache, unspecified: Secondary | ICD-10-CM

## 2015-10-21 DIAGNOSIS — R51 Headache: Principal | ICD-10-CM

## 2015-11-03 ENCOUNTER — Ambulatory Visit
Admission: RE | Admit: 2015-11-03 | Discharge: 2015-11-03 | Disposition: A | Discharge status: Home / Self Care | Payer: Managed Care, Other (non HMO) | Source: Ambulatory Visit | Attending: Adult Health | Admitting: Adult Health

## 2015-11-03 ENCOUNTER — Other Ambulatory Visit: Payer: Managed Care, Other (non HMO)

## 2015-11-03 DIAGNOSIS — Z87898 Personal history of other specified conditions: Secondary | ICD-10-CM

## 2015-11-03 DIAGNOSIS — R519 Headache, unspecified: Secondary | ICD-10-CM

## 2015-11-03 DIAGNOSIS — R51 Headache: Principal | ICD-10-CM

## 2015-11-03 MED ORDER — IOPAMIDOL (ISOVUE-300) INJECTION 61%
75.0000 mL | Freq: Once | INTRAVENOUS | Status: AC | PRN
  Administered 2015-11-03: 75 mL via INTRAVENOUS

## 2017-07-21 ENCOUNTER — Encounter: Payer: Self-pay | Admitting: Neurology

## 2017-07-26 ENCOUNTER — Institutional Professional Consult (permissible substitution): Payer: Managed Care, Other (non HMO) | Admitting: Neurology

## 2017-07-26 ENCOUNTER — Telehealth: Payer: Self-pay | Admitting: Neurology

## 2017-07-26 NOTE — Telephone Encounter (Signed)
Pt c/a appt today, she has migraine and dizzy, not able to drive. Please call to advise on fee and r/s.

## 2017-09-27 ENCOUNTER — Encounter: Payer: Self-pay | Admitting: Neurology

## 2017-09-27 ENCOUNTER — Telehealth: Payer: Self-pay | Admitting: Neurology

## 2017-09-27 ENCOUNTER — Ambulatory Visit: Payer: Managed Care, Other (non HMO) | Admitting: Neurology

## 2017-09-27 VITALS — BP 127/86 | HR 74 | Ht 63.0 in | Wt 204.0 lb

## 2017-09-27 DIAGNOSIS — IMO0002 Reserved for concepts with insufficient information to code with codable children: Secondary | ICD-10-CM

## 2017-09-27 DIAGNOSIS — R519 Headache, unspecified: Secondary | ICD-10-CM

## 2017-09-27 DIAGNOSIS — G43709 Chronic migraine without aura, not intractable, without status migrainosus: Secondary | ICD-10-CM | POA: Diagnosis not present

## 2017-09-27 DIAGNOSIS — G43711 Chronic migraine without aura, intractable, with status migrainosus: Secondary | ICD-10-CM | POA: Insufficient documentation

## 2017-09-27 DIAGNOSIS — R51 Headache: Secondary | ICD-10-CM | POA: Diagnosis not present

## 2017-09-27 MED ORDER — NORTRIPTYLINE HCL 10 MG PO CAPS: 20.0000 mg | ORAL_CAPSULE | Freq: Every day | ORAL | 11 refills | Status: DC

## 2017-09-27 MED ORDER — ONDANSETRON 4 MG PO TBDP: 4.0000 mg | ORAL_TABLET | Freq: Three times a day (TID) | ORAL | 6 refills | Status: DC | PRN

## 2017-09-27 MED ORDER — ERENUMAB-AOOE 70 MG/ML ~~LOC~~ SOAJ
70.0000 mg | SUBCUTANEOUS | 11 refills | Status: DC
Start: 2017-09-27 — End: 2019-01-25

## 2017-09-27 MED ORDER — RIZATRIPTAN BENZOATE 10 MG PO TBDP: 10.0000 mg | ORAL_TABLET | ORAL | 6 refills | Status: DC | PRN

## 2017-09-27 MED ORDER — DICLOFENAC POTASSIUM(MIGRAINE) 50 MG PO PACK: 50.0000 mg | PACK | ORAL | 6 refills | Status: DC | PRN

## 2017-09-27 NOTE — Telephone Encounter (Signed)
[employer] order sent to [facility]. They obtain the auth and will reach out to the pt to schedule.  °

## 2017-09-27 NOTE — Progress Notes (Signed)
PATIENT: Kendra Parker DOB: Sep 12, 1970  Chief Complaint  Patient presents with  . New Patient (Initial Visit)    PCP: Alvester Chou, NP. Pt is alone.   . Migraine    Patient currently has a migraine, she says they will last about 2 weeks, turn to mild headaches, and then cycle back to a migraine.      HISTORICAL  Kendra Parker is a 47 year old female, seen in refer by her primary care nurse practitioner Alvester Chou, for evaluation of migraine headache, initial evaluation was on Sep 27, 2017.  She had a history of hypertension, hyperlipidemia, chronic insomnia, reported long history of chronic migraine headache.  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 Domingo Cocking, per patient, she has tried and failed gabapentin up to 3600 mg, Botox injection, still on polypharmacy treatment, including Seroquel 50 mg every day, Cymbalta 30 mg daily, clonazepam 0.5 mg 3 times a day  In addition, she is also under pain management, taking oxycodone 5 mg 4 times a day  She used to have intermittent headaches, but gradually getting worse since 2018, become a daily headache at the end of 2018, over the past 6 months, she was given prescription of Fioricet, 4 to 6 tablets on a daily basis, she has moderate to severe daily holoacranial headaches, with light noise sensitivity, debilitated from her headache.  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 Lamotrigine Rash    Only at 100 mg is intolerable-gets a rash from head to toe  . Penicillins Rash    HOME MEDICATIONS: Current Outpatient Medications  Medication Sig Dispense Refill  . atorvastatin (LIPITOR) 20 MG tablet Take 20 mg by mouth daily.    .  butalbital-acetaminophen-caffeine (FIORICET, ESGIC) 50-325-40 MG tablet     . Cholecalciferol (VITAMIN D3) 2000 UNITS TABS Take by mouth daily.    . clonazePAM (KLONOPIN) 0.5 MG tablet Take 0.5 mg by mouth 3 (three) times daily as needed. For anxiety    . DULoxetine (CYMBALTA) 30 MG capsule Take 30 mg by mouth daily.    . DULoxetine (CYMBALTA) 60 MG capsule Take 60 mg by mouth daily.    Marland Kitchen JANTOVEN 5 MG tablet daily.    Marland Kitchen lisinopril (PRINIVIL,ZESTRIL) 10 MG tablet     . methocarbamol (ROBAXIN) 750 MG tablet     . metoprolol (LOPRESSOR) 50 MG tablet 2 (two) times daily.    Marland Kitchen oxyCODONE (OXY IR/ROXICODONE) 5 MG immediate release tablet 4 (four) times daily as needed.    Marland Kitchen perphenazine (TRILAFON) 4 MG tablet 3 (three) times daily.    . QUEtiapine (SEROQUEL) 50 MG tablet For a good night sleep up to 3 tabs/16m a night    . spironolactone (ALDACTONE) 50 MG tablet Take 50 mg by mouth. Take 0.5 tablet daily.     No current facility-administered medications for this visit.     PAST MEDICAL HISTORY: Past Medical History:  Diagnosis Date  . Anxiety   . Migraine   . Numbness    Left leg  . Pelvic pain     PAST SURGICAL HISTORY: Past Surgical History:  Procedure Laterality Date  . ABDOMINAL HYSTERECTOMY     Partial  .  INNER EAR SURGERY    . LAPAROSCOPIC OVARIAN CYSTECTOMY     Left    FAMILY HISTORY: Family History  Problem Relation Age of Onset  . Multiple myeloma Mother   . Breast cancer Mother   . Emphysema Father     SOCIAL HISTORY:  Social History   Socioeconomic History  . Marital status: Married    Spouse name: Not on file  . Number of children: 1  . Years of education: HS  . Highest education level: Not on file  Occupational History  . Occupation: Unemployed  Social Needs  . Financial resource strain: Not on file  . Food insecurity:    Worry: Not on file    Inability: Not on file  . Transportation needs:    Medical: Not on file    Non-medical: Not on file    Tobacco Use  . Smoking status: Current Some Day Smoker    Types: Cigarettes, E-cigarettes  . Tobacco comment: Smokes 0.25 packs/week - uses vapor  Substance and Sexual Activity  . Alcohol use: No    Alcohol/week: 0.0 oz  . Drug use: No  . Sexual activity: Yes  Lifestyle  . Physical activity:    Days per week: Not on file    Minutes per session: Not on file  . Stress: Not on file  Relationships  . Social connections:    Talks on phone: Not on file    Gets together: Not on file    Attends religious service: Not on file    Active member of club or organization: Not on file    Attends meetings of clubs or organizations: Not on file    Relationship status: Not on file  . Intimate partner violence:    Fear of current or ex partner: Not on file    Emotionally abused: Not on file    Physically abused: Not on file    Forced sexual activity: Not on file  Other Topics Concern  . Not on file  Social History Narrative   Lives at home with her husband.   Left-handed.   24 oz caffeine per day.     PHYSICAL EXAM   Vitals:   09/27/17 1459  BP: 127/86  Pulse: 74  Weight: 204 lb (92.5 kg)  Height: 5' 3" (1.6 m)    Not recorded      Body mass index is 36.14 kg/m.  PHYSICAL EXAMNIATION:  Gen: NAD, conversant, well nourised, obese, well groomed                     Cardiovascular: Regular rate rhythm, no peripheral edema, warm, nontender. Eyes: Conjunctivae clear without exudates or hemorrhage Neck: Supple, no carotid bruits. Pulmonary: Clear to auscultation bilaterally   NEUROLOGICAL EXAM:  MENTAL STATUS: Speech:    Speech is normal; fluent and spontaneous with normal comprehension.  Cognition:     Orientation to time, place and person     Normal recent and remote memory     Normal Attention span and concentration     Normal Language, naming, repeating,spontaneous speech     Fund of knowledge   CRANIAL NERVES: CN II: Visual fields are full to confrontation.  Fundoscopic exam is normal with sharp discs and no vascular changes. Pupils are round equal and briskly reactive to light. CN III, IV, VI: extraocular movement are normal. No ptosis. CN V: Facial sensation is intact to pinprick in all 3 divisions bilaterally. Corneal responses are intact.  CN VII:  Face is symmetric with normal eye closure and smile. CN VIII: Hearing is normal to rubbing fingers CN IX, X: Palate elevates symmetrically. Phonation is normal. CN XI: Head turning and shoulder shrug are intact CN XII: Tongue is midline with normal movements and no atrophy.  MOTOR: There is no pronator drift of out-stretched arms. Muscle bulk and tone are normal. Muscle strength is normal.  REFLEXES: Reflexes are 2+ and symmetric at the biceps, triceps, knees, and ankles. Plantar responses are flexor.  SENSORY: Intact to light touch, pinprick, positional sensation and vibratory sensation are intact in fingers and toes.  COORDINATION: Rapid alternating movements and fine finger movements are intact. There is no dysmetria on finger-to-nose and heel-knee-shin.    GAIT/STANCE: Posture is normal. Gait is steady with normal steps, base, arm swing, and turning. Heel and toe walking are normal. Tandem gait is normal.  Romberg is absent.   DIAGNOSTIC DATA (LABS, IMAGING, TESTING) - I reviewed patient records, labs, notes, testing and imaging myself where available.   ASSESSMENT AND PLAN  Kendra Parker is a 47 y.o. female   Daily headaches, History of recurrent DVT  Her headache has features of migraine headache, a component of medicine rebound headache with her prolonged daily Fioricet use,  MRI of the brain, MRV of the brain to rule out structural lesion  Laboratory evaluations  Add on low-dose nortriptyline 10 mg titrating to 20 mg every night as preventive medications  Aimovig as preventive medications,  Previously tried Imitrex without significant improvement of her headaches, will try  combination of Maxalt and Zofran as needed   Marcial Pacas, M.D. Ph.D.  [facility] [address] [place], [place] [address] Ph: [phone] Fax: [phone]  CC: Alvester Chou, NP

## 2017-09-28 ENCOUNTER — Telehealth: Payer: Self-pay | Admitting: *Deleted

## 2017-09-28 LAB — CBC WITH DIFFERENTIAL
BASOS ABS: 0 10*3/uL (ref 0.0–0.2)
Basos: 0 %
EOS (ABSOLUTE): 0 10*3/uL (ref 0.0–0.4)
Eos: 0 %
HEMATOCRIT: 39.9 % (ref 34.0–46.6)
HEMOGLOBIN: 13 g/dL (ref 11.1–15.9)
Immature Grans (Abs): 0 10*3/uL (ref 0.0–0.1)
Immature Granulocytes: 0 %
Lymphocytes Absolute: 2.6 10*3/uL (ref 0.7–3.1)
Lymphs: 24 %
MCH: 29.5 pg (ref 26.6–33.0)
MCHC: 32.6 g/dL (ref 31.5–35.7)
MCV: 91 fL (ref 79–97)
MONOCYTES: 5 %
MONOS ABS: 0.6 10*3/uL (ref 0.1–0.9)
NEUTROS ABS: 7.5 10*3/uL — AB (ref 1.4–7.0)
Neutrophils: 71 %
RBC: 4.41 x10E6/uL (ref 3.77–5.28)
RDW: 13.8 % (ref 12.3–15.4)
WBC: 10.7 10*3/uL (ref 3.4–10.8)

## 2017-09-28 LAB — COMPREHENSIVE METABOLIC PANEL
ALBUMIN: 4.5 g/dL (ref 3.5–5.5)
ALK PHOS: 80 IU/L (ref 39–117)
ALT: 19 IU/L (ref 0–32)
AST: 14 IU/L (ref 0–40)
Albumin/Globulin Ratio: 1.6 (ref 1.2–2.2)
BUN/Creatinine Ratio: 12 (ref 9–23)
BUN: 8 mg/dL (ref 6–24)
CHLORIDE: 100 mmol/L (ref 96–106)
CO2: 23 mmol/L (ref 20–29)
Calcium: 9.7 mg/dL (ref 8.7–10.2)
Creatinine, Ser: 0.67 mg/dL (ref 0.57–1.00)
GFR calc Af Amer: 120 mL/min/{1.73_m2} (ref >59)
GFR calc non Af Amer: 104 mL/min/{1.73_m2} (ref >59)
GLOBULIN, TOTAL: 2.9 g/dL (ref 1.5–4.5)
Glucose: 95 mg/dL (ref 65–99)
POTASSIUM: 5.1 mmol/L (ref 3.5–5.2)
SODIUM: 138 mmol/L (ref 134–144)
Total Protein: 7.4 g/dL (ref 6.0–8.5)

## 2017-09-28 LAB — TSH: TSH: 2.48 u[IU]/mL (ref 0.450–4.500)

## 2017-09-28 LAB — VITAMIN B12: VITAMIN B 12: 441 pg/mL (ref 232–1245)

## 2017-09-28 LAB — HIV ANTIBODY (ROUTINE TESTING W REFLEX): HIV SCREEN 4TH GENERATION: NONREACTIVE

## 2017-09-28 LAB — RPR: RPR Ser Ql: NONREACTIVE

## 2017-09-28 LAB — C-REACTIVE PROTEIN: CRP: 2 mg/L (ref 0.0–4.9)

## 2017-09-28 LAB — SEDIMENTATION RATE: Sed Rate: 13 mm/hr (ref 0–32)

## 2017-09-28 NOTE — Telephone Encounter (Signed)
Spoke to patient she is aware of results

## 2017-09-28 NOTE — Telephone Encounter (Signed)
-----   Message from Levert Feinstein, MD sent at 09/28/2017  4:04 PM EDT ----- Please call patient for no significant abnormality on laboratory evaluations

## 2017-09-28 NOTE — Telephone Encounter (Signed)
PA for Aimovig approved by [employer] [phone]) through 03/27/2018.  UJ#[id number] SS.

## 2017-09-29 ENCOUNTER — Other Ambulatory Visit: Payer: Self-pay | Admitting: *Deleted

## 2017-09-29 ENCOUNTER — Telehealth: Payer: Self-pay | Admitting: *Deleted

## 2017-09-29 DIAGNOSIS — R519 Headache, unspecified: Secondary | ICD-10-CM | POA: Insufficient documentation

## 2017-09-29 DIAGNOSIS — R51 Headache: Secondary | ICD-10-CM

## 2017-09-29 MED ORDER — DICLOFENAC POTASSIUM(MIGRAINE) 50 MG PO PACK: PACK | ORAL | 6 refills | Status: DC

## 2017-09-29 NOTE — Telephone Encounter (Signed)
PA for [facility] approved by Monia Pouch [phone]) - valid through 09/30/2018.  Pt WG#[id number]. HQ#[id number].

## 2017-09-30 NOTE — Telephone Encounter (Signed)
Fax received from [employer].  [place] PA approved for dates 09/29/17 thru 09/30/18.  PA# Monia Pouch Value-Value [id number]-[employer]-[employer] [id number] NT/fim

## 2017-10-07 ENCOUNTER — Other Ambulatory Visit: Payer: Managed Care, Other (non HMO)

## 2017-10-07 ENCOUNTER — Inpatient Hospital Stay: Admission: RE | Admit: 2017-10-07 | Payer: Managed Care, Other (non HMO) | Source: Ambulatory Visit

## 2017-10-21 ENCOUNTER — Ambulatory Visit
Admission: RE | Admit: 2017-10-21 | Discharge: 2017-10-21 | Disposition: A | Discharge status: Home / Self Care | Payer: Managed Care, Other (non HMO) | Source: Ambulatory Visit | Attending: Neurology | Admitting: Neurology

## 2017-10-21 DIAGNOSIS — R51 Headache: Principal | ICD-10-CM

## 2017-10-21 DIAGNOSIS — R519 Headache, unspecified: Secondary | ICD-10-CM

## 2017-10-24 ENCOUNTER — Telehealth: Payer: Self-pay | Admitting: Neurology

## 2017-10-24 DIAGNOSIS — R519 Headache, unspecified: Secondary | ICD-10-CM

## 2017-10-24 DIAGNOSIS — R51 Headache: Principal | ICD-10-CM

## 2017-10-24 NOTE — Telephone Encounter (Signed)
Please call patient, MRI of the brain showed no acute abnormality, mild supratentorium small vessel disease, no change compared to previous MRI since 2008, MRV of the brain showed decreased signal at left venous drainage system.   If her headaches have much improved, it is Ok to keep follow up appt in August, if she still has a lot of headaches, she should see her ophthalmologist if she has not seen one in 6 months, t rule out papillary edema  IMPRESSION: This MRI of the brain without contrast shows the following: 1. Some scattered T2/FLAIR hyperintense foci in the subcortical white matter consistent with mild chronic microvascular ischemic change or the sequela of migraine headaches or trauma. This is essentially unchanged when compared to the 2008 MRI. 2. Mild generalized cortical atrophy most pronounced in the posterior frontal and parietal lobes. This has progressed compared to the 2008 MRI. 3. There are no acute findings.  IMPRESSION: This MR venogram shows the following: 1.   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  This could represent a congenital variant, but stenosis or occlusion of the left transverse sinus cannot be ruled out. 2.   The other intracranial veins and sinuses appear normal.

## 2017-10-25 NOTE — Addendum Note (Signed)
Addended by: Lindell SparKIRKMAN, MICHELLE C on: 10/25/2017 11:19 AM   Modules accepted: Orders

## 2017-10-25 NOTE — Telephone Encounter (Signed)
Spoke to patient - she is aware of results.  She is still having frequent headaches and is agreeable to see ophthalmologist.  She last saw an optometrist in August 2018.  She is requesting a referral to ophthalmologist.  Per vo by Dr. Terrace ArabiaYan - ok to place this referral.

## 2017-11-17 ENCOUNTER — Telehealth: Payer: Self-pay | Admitting: Neurology

## 2017-11-17 ENCOUNTER — Encounter: Payer: Self-pay | Admitting: Neurology

## 2017-11-17 ENCOUNTER — Ambulatory Visit: Payer: Managed Care, Other (non HMO) | Admitting: Neurology

## 2017-11-17 VITALS — BP 131/90 | HR 99 | Ht 63.0 in | Wt 200.0 lb

## 2017-11-17 DIAGNOSIS — G43709 Chronic migraine without aura, not intractable, without status migrainosus: Secondary | ICD-10-CM

## 2017-11-17 NOTE — Telephone Encounter (Signed)
Pt called stating she has had an on going migraine for about 3 weeks stating she has taking Zofran, Cambia, Maxalt and Nortriptyline but hasn't touched her migraine. Pt requesting a call to discuss other options.

## 2017-11-17 NOTE — Progress Notes (Signed)
Patient arrived to our office for a work-in nerve block.  She had another appt and decided to leave.  Declined an appt on 11/18/17 for infusion due to a conflict with another MD appt.  She has been placed on the schedule with Dr. Terrace ArabiaYan on 11/21/17.

## 2017-11-17 NOTE — Telephone Encounter (Signed)
Per Dr. Terrace ArabiaYan, she may come to our office for a nerve block.  Patient agreeable and placed on the schedule today.

## 2017-11-21 ENCOUNTER — Ambulatory Visit: Payer: Managed Care, Other (non HMO) | Admitting: Neurology

## 2017-11-21 ENCOUNTER — Encounter: Payer: Self-pay | Admitting: Neurology

## 2017-11-21 VITALS — BP 103/76 | HR 81 | Ht 63.0 in | Wt 202.5 lb

## 2017-11-21 DIAGNOSIS — IMO0002 Reserved for concepts with insufficient information to code with codable children: Secondary | ICD-10-CM

## 2017-11-21 DIAGNOSIS — G43709 Chronic migraine without aura, not intractable, without status migrainosus: Secondary | ICD-10-CM

## 2017-11-21 NOTE — Progress Notes (Signed)
**  Sensorcaine 0.5%, NDC [id number], Lot [id number], Exp 01/2021.//mck,rn**

## 2017-11-21 NOTE — Progress Notes (Signed)
   History:  She returns for 3 weeks history of intractable 10/10 headaches, has tried home remedy, multiple dose of maxalt without helping her headaches, she came in for nerve block today,  Bilateral occipital and trigeminal nerve block; trigger point injection of bilateral cervical and upper trapezius muscles for intractable headache  Bupivacaine 0.5% was injected on the scalp bilaterally at several locations:  -On the occipital area of the head, 3 injections each side, 0.5 cc per injection at the midpoint between the mastoid process and the occipital protuberance. 2 other injections were done one finger breadth from the initial injection, one at a 10 o'clock position and the other at a 2 o'clock position.  -2 injections of 0.5 cc were done in the temporal regions, 2 fingerbreadths above the tragus of the ear, with the second injection one fingerbreadth posteriorly to the first.  - 1 in the left medial brow with 0.1 cc    -0.5 cc was injected into bilateral upper trapezius and bilateral upper cervical paraspinals  The patient complains of significant injection site pain, did not have significant improvement after injection, could not tolerate mirror injection at bilateral frontal region.

## 2017-11-28 ENCOUNTER — Telehealth: Payer: Self-pay | Admitting: Neurology

## 2017-11-28 NOTE — Telephone Encounter (Signed)
Dr. Terrace ArabiaYan has reviewed patient's chart.  Per vo by Dr. Terrace ArabiaYan, offer the following IV medications:  1) Depakote 1000mg  2) Compazine 10mg  3) Toradol 30mg   Spoke to patient.  She will bring a driver and arrive to our office at 2pm for her infusion.  Signed MD orders have been provided to [facility].    Previous nerve block did not provide relief. She has failed multiple medications in the past, including Botox.  Per Dr. Terrace ArabiaYan, give Aimovig another few weeks to start working.  If it does not start to help, she plans to refer the patient to a headache specialty clinic.

## 2017-11-28 NOTE — Telephone Encounter (Signed)
Pt requesting a call, stating that she received a nerve block last week but hasn't helped her migraine. Pt would like to discuss any other options to maintain the s/e and pain. Please call to discuss

## 2018-01-03 ENCOUNTER — Telehealth: Payer: Self-pay | Admitting: Neurology

## 2018-01-03 NOTE — Telephone Encounter (Signed)
Pt called stating she has had a migraine for about 2 weeks. Stating she has taking all prescribed medication but nothing has touched the migraine. Pt requesting a call to discuss coming in for an infusion.

## 2018-01-03 NOTE — Telephone Encounter (Signed)
Patient has previously received migraine relief with the following medications:  1) Depakote 1000mg  2) Compazine 10mg  3) Toradol 30mg   Her last infusion was 11/28/17.  If Dr .Terrace ArabiaYan authorizes the infusion, the patient can be here at 1pm with a driver.

## 2018-01-04 NOTE — Telephone Encounter (Signed)
Dr. Terrace ArabiaYan authorized the infusion.  Signed MD orders have been provided to [facility].  The patient will arrive at 1pm with a driver.

## 2018-01-17 ENCOUNTER — Encounter: Payer: Self-pay | Admitting: Neurology

## 2018-01-17 ENCOUNTER — Telehealth: Payer: Self-pay | Admitting: *Deleted

## 2018-01-17 ENCOUNTER — Ambulatory Visit: Payer: Managed Care, Other (non HMO) | Admitting: Neurology

## 2018-01-17 NOTE — Telephone Encounter (Signed)
Called at 2:13pm to cancel 3pm follow up appt due to having active migraine.  I called the patient and offered for her to keep the appt so we could treat her migraine in the office.  She was unable to drive.  She will call back to reschedule.

## 2018-01-31 DIAGNOSIS — Z0289 Encounter for other administrative examinations: Secondary | ICD-10-CM

## 2018-03-20 ENCOUNTER — Other Ambulatory Visit: Payer: Self-pay | Admitting: Neurology

## 2018-03-27 ENCOUNTER — Telehealth: Payer: Self-pay | Admitting: *Deleted

## 2018-03-27 NOTE — Telephone Encounter (Signed)
PA completed and approved through covermymeds - key: [id number].

## 2018-03-27 NOTE — Telephone Encounter (Addendum)
PA for Aimovig completed and approved through 03/27/2019 (covermymeds - key: [id number]).

## 2018-03-27 NOTE — Telephone Encounter (Signed)
Bryan with Cover my meds called and stated that a PA request was put in for the aimovig but there was already a valid one on file through today. He will fax over a new request form to be filled out and returned for coverage after today. No need to return call.

## 2018-04-03 ENCOUNTER — Telehealth: Payer: Self-pay | Admitting: Neurology

## 2018-04-03 NOTE — Telephone Encounter (Addendum)
Patient has previously received migraine relief with the following medications:  1) Depakote 1000mg  2) Compazine 10mg  3) Toradol 30mg   Signed MD orders have been provided to [facility].  She is unable to get a ride today.  She will be here tomorrow, with a driver, at 9am.

## 2018-04-03 NOTE — Telephone Encounter (Signed)
Pt called stating she has had a migraine for about 3 weeks stating it has been very constant. No medication have helped. Requesting a call to discuss coming in for an infusion.

## 2018-04-24 ENCOUNTER — Ambulatory Visit: Payer: Managed Care, Other (non HMO) | Admitting: Neurology

## 2018-06-13 ENCOUNTER — Other Ambulatory Visit: Payer: Self-pay | Admitting: Neurology

## 2018-06-13 ENCOUNTER — Telehealth: Payer: Self-pay | Admitting: *Deleted

## 2018-06-13 NOTE — Telephone Encounter (Signed)
PA for rizatriptan started on covermymeds (key: [id number]). Patient has coverage through [place].  ER#[id number].  Decision pending.

## 2018-06-16 ENCOUNTER — Encounter: Payer: Self-pay | Admitting: *Deleted

## 2018-06-16 NOTE — Telephone Encounter (Addendum)
The pre-filled quantity on the covermymeds case, from the pharmacy, was incorrect (#12 per 9 days) and this caused a denial.  An appeal was sent to [facility] requesting them to review the case again with the requested quantity of #12 per 30 days.  Decision pending Monia Pouch - ph: [phone], fax: [phone]).

## 2018-07-03 ENCOUNTER — Other Ambulatory Visit: Payer: Self-pay | Admitting: Neurology

## 2018-07-03 ENCOUNTER — Ambulatory Visit: Payer: Managed Care, Other (non HMO) | Admitting: Family Medicine

## 2018-07-04 ENCOUNTER — Encounter: Payer: Self-pay | Admitting: *Deleted

## 2018-07-04 ENCOUNTER — Other Ambulatory Visit: Payer: Self-pay | Admitting: *Deleted

## 2018-07-04 ENCOUNTER — Telehealth: Payer: Self-pay | Admitting: Neurology

## 2018-07-04 MED ORDER — NORTRIPTYLINE HCL 10 MG PO CAPS: 20.0000 mg | ORAL_CAPSULE | Freq: Every day | ORAL | 0 refills | Status: DC

## 2018-07-04 NOTE — Telephone Encounter (Addendum)
Spoke to patient.  She has scheduled a follow up with Margie Ege, NP on 07/24/2018.  30 day rx of nortriptyline 10mg , two capsules daily sent to the pharmacy.  Further refills can be provided at her appt.

## 2018-07-04 NOTE — Telephone Encounter (Signed)
Pt  Has called re: her nortriptyline (PAMELOR) 10 MG she wants to discuss this with RN Marcelino Duster

## 2018-07-06 NOTE — Telephone Encounter (Signed)
I called [employer] claims department, at [phone], to check on case [id number]. Member ID: [id number].  Per the [facility] rep, the patient is able to get her rizatripan as long as she stays within the quantity limits of #12/30 days.  No PA will be required.

## 2018-07-20 NOTE — Progress Notes (Addendum)
PATIENT: Kendra Parker DOB: Feb 02, 1971  REASON FOR VISIT: follow up HISTORY FROM: patient  HISTORY OF PRESENT ILLNESS: Today 07/24/18  HISTORY 09/27/2017 YY: Kendra Parker is a 48 year old female, seen in refer by her primary care nurse practitioner Alvester Chou, for evaluation of migraine headache, initial evaluation was on Sep 27, 2017.  She had a history of hypertension, hyperlipidemia, chronic insomnia, reported long history of chronic migraine headache.  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 Domingo Cocking, per patient, she has tried and failed gabapentin up to 3600 mg, Botox injection, still on polypharmacy treatment, including Seroquel 50 mg every day, Cymbalta 30 mg daily, clonazepam 0.5 mg 3 times a day  In addition, she is also under pain management, taking oxycodone 5 mg 4 times a day  She used to have intermittent headaches, but gradually getting worse since 2018, become a daily headache at the end of 2018, over the past 6 months, she was given prescription of Fioricet, 4 to 6 tablets on a daily basis, she has moderate to severe daily holoacranial headaches, with light noise sensitivity, debilitated from her headache.  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  At that time she was started on nortriptyline titrating to 20 mg every night, Aimovig, Maxalt and Zofran as needed.  She had MRI of the brain in June 2019 showing no acute abnormality, mild supratentorium small vessel disease, MRV of the brain showed decreased signal at left venous drainage system.  She was to see her ophthalmologist to rule out papillary edema.  She was seen in our office on July 2019 for a nerve block after a 3-week history of intractable headache.  Nerve  block did not relieve her headache. She came to the office on November 28, 2017 for IV infusion for migraine cocktail.  She came in in August 2019, November 2019 for IV infusion of migraine cocktail.   Today she is complaining of a migraine headache for the past 2 weeks.  She reports that she has daily headaches.  She reports she has been feeling depressed because she has daily headaches however she has been doing meditation to try to improve this.  She is currently taking 70 mg Aimovig, 20 mg nortriptyline, Maxalt and Zofran.  She does report some benefit with Aimovig. She describes the headache pain as occurring to the top of her head.  She reports she followed up with her eye doctor and that they were able to rule out papillary edema.  She reports she is having to take next dose of Maxalt every month.  She presents today for follow-up.  Today she is requesting to have IV infusion of migraine cocktail.    REVIEW OF SYSTEMS: Out of a complete 14 system review of symptoms, the patient complains only of the following symptoms, and all other reviewed systems are negative.  Dizziness, headache, passing out, depression, anxious ALLERGIES: Allergies  Allergen Reactions  . Lamotrigine Rash    Only at 100 mg is intolerable-gets a rash from head to toe  . Penicillins Rash    HOME MEDICATIONS: Outpatient Medications Prior to Visit  Medication Sig Dispense Refill  . atorvastatin (LIPITOR) 20 MG tablet Take 20 mg by mouth daily.    Marland Kitchen  Cholecalciferol (VITAMIN D3) 2000 UNITS TABS Take by mouth daily.    . Diclofenac Potassium (CAMBIA) 50 MG PACK Take 1 tab at onset of migraine.  May repeat in 2 hrs, if needed.  Max dose: 2 packs/day. This is a 30 day prescription. 9 each 6  . Erenumab-aooe (AIMOVIG) 70 MG/ML SOAJ Inject 70 mg into the skin every 30 (thirty) days. 1 pen 11  . JANTOVEN 5 MG tablet daily.    Marland Kitchen lisinopril (PRINIVIL,ZESTRIL) 10 MG tablet     . methocarbamol (ROBAXIN) 750 MG tablet     .  metoprolol (LOPRESSOR) 50 MG tablet 2 (two) times daily.    . nortriptyline (PAMELOR) 10 MG capsule Take 2 capsules (20 mg total) by mouth daily. 60 capsule 0  . ondansetron (ZOFRAN-ODT) 4 MG disintegrating tablet Take one tablet every 8 hours as needed.  #20/30days 20 tablet 5  . oxyCODONE (OXY IR/ROXICODONE) 5 MG immediate release tablet Take 10 mg by mouth 4 (four) times daily as needed.     Marland Kitchen perphenazine (TRILAFON) 4 MG tablet 3 (three) times daily.    . QUEtiapine (SEROQUEL) 50 MG tablet For a good night sleep up to 3 tabs/171m a night    . rizatriptan (MAXALT-MLT) 10 MG disintegrating tablet DISSOLVE 1 TABLET BY MOUTH AS NEEDED. MAY REPEAT IN 2 HOURS IF NEEDED 12 tablet 0  . spironolactone (ALDACTONE) 50 MG tablet Take 50 mg by mouth. Take 0.5 tablet daily.     No facility-administered medications prior to visit.     PAST MEDICAL HISTORY: Past Medical History:  Diagnosis Date  . Anxiety   . Migraine   . Numbness    Left leg  . Pelvic pain     PAST SURGICAL HISTORY: Past Surgical History:  Procedure Laterality Date  . ABDOMINAL HYSTERECTOMY     Partial  . INNER EAR SURGERY    . LAPAROSCOPIC OVARIAN CYSTECTOMY     Left    FAMILY HISTORY: Family History  Problem Relation Age of Onset  . Multiple myeloma Mother   . Breast cancer Mother   . Emphysema Father     SOCIAL HISTORY: Social History   Socioeconomic History  . Marital status: Married    Spouse name: Not on file  . Number of children: 1  . Years of education: HS  . Highest education level: Not on file  Occupational History  . Occupation: Unemployed  Social Needs  . Financial resource strain: Not on file  . Food insecurity:    Worry: Not on file    Inability: Not on file  . Transportation needs:    Medical: Not on file    Non-medical: Not on file  Tobacco Use  . Smoking status: Current Some Day Smoker    Types: Cigarettes, E-cigarettes  . Smokeless tobacco: Never Used  . Tobacco comment: Smokes  0.25 packs/week - uses vapor  Substance and Sexual Activity  . Alcohol use: No    Alcohol/week: 0.0 standard drinks  . Drug use: No  . Sexual activity: Yes  Lifestyle  . Physical activity:    Days per week: Not on file    Minutes per session: Not on file  . Stress: Not on file  Relationships  . Social connections:    Talks on phone: Not on file    Gets together: Not on file    Attends religious service: Not on file    Active member of club or organization: Not on file    Attends  meetings of clubs or organizations: Not on file    Relationship status: Not on file  . Intimate partner violence:    Fear of current or ex partner: Not on file    Emotionally abused: Not on file    Physically abused: Not on file    Forced sexual activity: Not on file  Other Topics Concern  . Not on file  Social History Narrative   Lives at home with her husband.   Left-handed.   24 oz caffeine per day.      PHYSICAL EXAM  Vitals:   07/24/18 1246  BP: 108/75  Pulse: 70  Weight: 210 lb 12.8 oz (95.6 kg)  Height: 5' 3"  (1.6 m)   Body mass index is 37.34 kg/m.  Generalized: Well developed, in no acute distress   Neurological examination  Mentation: Alert oriented to time, place, history taking. Follows all commands speech and language fluent Cranial nerve II-XII: Pupils were equal round reactive to light. Extraocular movements were full, visual field were full on confrontational test. Facial sensation and strength were normal. Uvula tongue midline. Head turning and shoulder shrug  were normal and symmetric. Motor: The motor testing reveals 5 over 5 strength of all 4 extremities. Good symmetric motor tone is noted throughout.  Sensory: Sensory testing is intact to soft touch on all 4 extremities. No evidence of extinction is noted.  Coordination: Cerebellar testing reveals good finger-nose-finger and heel-to-shin bilaterally.  Gait and station: Gait is normal. Tandem gait is normal. Romberg is  negative. No drift is seen.  Reflexes: Deep tendon reflexes are symmetric and normal bilaterally.   DIAGNOSTIC DATA (LABS, IMAGING, TESTING) - I reviewed patient records, labs, notes, testing and imaging myself where available.  Lab Results  Component Value Date   WBC 10.7 09/27/2017   HGB 13.0 09/27/2017   HCT 39.9 09/27/2017   MCV 91 09/27/2017   PLT 276 07/24/2011      Component Value Date/Time   NA 138 09/27/2017 1549   K 5.1 09/27/2017 1549   CL 100 09/27/2017 1549   CO2 23 09/27/2017 1549   GLUCOSE 95 09/27/2017 1549   GLUCOSE 103 (H) 07/23/2011 0625   BUN 8 09/27/2017 1549   CREATININE 0.67 09/27/2017 1549   CALCIUM 9.7 09/27/2017 1549   PROT 7.4 09/27/2017 1549   ALBUMIN 4.5 09/27/2017 1549   AST 14 09/27/2017 1549   ALT 19 09/27/2017 1549   ALKPHOS 80 09/27/2017 1549   BILITOT <0.2 09/27/2017 1549   GFRNONAA 104 09/27/2017 1549   GFRAA 120 09/27/2017 1549   No results found for: CHOL, HDL, LDLCALC, LDLDIRECT, TRIG, CHOLHDL No results found for: HGBA1C Lab Results  Component Value Date   VITAMINB12 441 09/27/2017   Lab Results  Component Value Date   TSH 2.480 09/27/2017      ASSESSMENT AND PLAN 48 y.o. year old female  has a past medical history of Anxiety, Migraine, Numbness, and Pelvic pain. here with:  1.  Migraine headaches  She is complaining of daily headaches.  Today she is complaining of a 10/10 headache that she has had for 2 weeks.  She is requesting to have a migraine cocktail and IV infusion today.  She is not driving.  I have written orders for Depacon 1067m, 30 mg Toradol, 10 mg Compazine.  She is well-appearing today.  We will increase her Aimovig 140 mg.  We will also try Ubrelvy 50 mg tablets. She can take one 50 mg tablet for acute migraine  and can repeat another tablet within 2 hours not to exceed 100 mg in 24 hours.  Advised her to not take these in combination with Maxalt. A coupon card was given and we discussed the side effects of  medication.  I referred her to the headache clinic in [place].  In the past she has tried Botox and failed.  She will follow-up in 6 months if needed.  We did discuss that if she switches her care to the headache clinic she will not need to follow-up here however we are here to help if she needs [place].  I spent 25 minutes with this patient, 50% of the time was spent discussing her plan of care and medications.    Butler Denmark, AGNP-C, DNP 07/24/2018, 2:37 PM [facility] [address], [place], [place] [address] [phone]

## 2018-07-24 ENCOUNTER — Ambulatory Visit: Payer: Managed Care, Other (non HMO) | Admitting: Neurology

## 2018-07-24 ENCOUNTER — Encounter: Payer: Self-pay | Admitting: Neurology

## 2018-07-24 VITALS — BP 108/75 | HR 70 | Ht 63.0 in | Wt 210.8 lb

## 2018-07-24 DIAGNOSIS — G43709 Chronic migraine without aura, not intractable, without status migrainosus: Secondary | ICD-10-CM | POA: Diagnosis not present

## 2018-07-24 DIAGNOSIS — IMO0002 Reserved for concepts with insufficient information to code with codable children: Secondary | ICD-10-CM

## 2018-07-24 MED ORDER — UBROGEPANT 50 MG PO TABS: 50.0000 mg | ORAL_TABLET | ORAL | 5 refills | Status: DC | PRN

## 2018-07-24 MED ORDER — ERENUMAB-AOOE 140 MG/ML ~~LOC~~ SOAJ: 140.0000 mg | SUBCUTANEOUS | 5 refills | Status: DC

## 2018-07-24 NOTE — Progress Notes (Signed)
I have reviewed and agreed above plan. 

## 2018-07-25 ENCOUNTER — Other Ambulatory Visit: Payer: Self-pay | Admitting: Neurology

## 2018-07-26 ENCOUNTER — Other Ambulatory Visit: Payer: Self-pay | Admitting: Neurology

## 2018-07-31 ENCOUNTER — Other Ambulatory Visit: Payer: Self-pay | Admitting: Neurology

## 2018-08-06 ENCOUNTER — Other Ambulatory Visit: Payer: Self-pay | Admitting: Neurology

## 2018-08-16 ENCOUNTER — Other Ambulatory Visit: Payer: Self-pay | Admitting: Neurology

## 2018-08-17 ENCOUNTER — Telehealth: Payer: Self-pay

## 2018-08-17 NOTE — Telephone Encounter (Signed)
Pending approval for Ubrelvy 50 mg tablets [id number] Rx #: [id number] ICD 10 code- G43.709  I will update once a decision has been made.

## 2018-08-20 ENCOUNTER — Other Ambulatory Visit: Payer: Self-pay | Admitting: Neurology

## 2018-08-21 ENCOUNTER — Other Ambulatory Visit: Payer: Self-pay | Admitting: *Deleted

## 2018-08-21 MED ORDER — ONDANSETRON 4 MG PO TBDP: ORAL_TABLET | ORAL | 5 refills | Status: DC

## 2018-08-21 NOTE — Telephone Encounter (Signed)
Opened in error

## 2018-08-31 NOTE — Telephone Encounter (Signed)
Received PA denial for Ubrelvy.  CMM started appeal. [id number]

## 2018-08-31 NOTE — Telephone Encounter (Addendum)
Appeal process started.  5 days to hear determination.

## 2018-09-12 NOTE — Telephone Encounter (Signed)
After multiple calls, spoke to [name]. Appeal for medical necessity is needed if wanted to continue with this medication Ubrelvy 50mg  tabs.  [phone].  Fax [phone]. Called pt she had savings card and was able to say that she has taken it and it worked well.

## 2018-09-21 NOTE — Telephone Encounter (Signed)
Received a letter from [place] stating that the appeal for Kendra Parker was denied due to her not trying and failing the following alternatives: Eletriptan, Naratriptan, Rizatriptan, Sumatritptan, Zolmitriptan, Onzetra Xsail, Zembrace Symtouch, Zomig nasal spray. Please advise.

## 2018-12-20 ENCOUNTER — Telehealth: Payer: Self-pay | Admitting: *Deleted

## 2018-12-20 NOTE — Telephone Encounter (Signed)
I called [facility].  Spoke to pharmacist and pt picked last fill using card 10-24-18.  Now is asking for PA.  Received faxed form and that initiated call to pharmacy.

## 2018-12-21 NOTE — Telephone Encounter (Signed)
I called [facility], [place] [phone].  Spoke to [place] in [place] for [place] 50mg  #10 tabs/30days.  Dx G43.709.  She has been on thei drug since 07-24-18.  Has tried sumatriptan, rizatriptan.  (2 preferred drugs).  PA approval given for a year #[id number]. Will receive fax in 24-48 hours.

## 2018-12-25 NOTE — Telephone Encounter (Signed)
Received fax from [place].  aprroval fo ubrelvy from 12-21-18 thru 12-21-19.  PA # [id number]. Fax confirmation received [facility] [phone].

## 2019-01-15 ENCOUNTER — Other Ambulatory Visit: Payer: Self-pay | Admitting: Neurology

## 2019-01-25 ENCOUNTER — Other Ambulatory Visit: Payer: Self-pay | Admitting: Neurology

## 2019-01-25 ENCOUNTER — Encounter: Payer: Self-pay | Admitting: Neurology

## 2019-01-25 ENCOUNTER — Ambulatory Visit: Payer: 59 | Admitting: Neurology

## 2019-01-25 ENCOUNTER — Other Ambulatory Visit: Payer: Self-pay

## 2019-01-25 VITALS — BP 111/74 | HR 79 | Temp 97.3°F | Ht 63.0 in | Wt 222.5 lb

## 2019-01-25 DIAGNOSIS — G43709 Chronic migraine without aura, not intractable, without status migrainosus: Secondary | ICD-10-CM | POA: Diagnosis not present

## 2019-01-25 DIAGNOSIS — IMO0002 Reserved for concepts with insufficient information to code with codable children: Secondary | ICD-10-CM

## 2019-01-25 NOTE — Progress Notes (Signed)
PATIENT: Kendra Parker DOB: May 07, 1971  REASON FOR VISIT: follow up HISTORY FROM: patient  HISTORY OF PRESENT ILLNESS: Today 01/25/19  HISTORY 09/27/2017 Kendra Parker is a 48 year old female, seen in refer by her primary care nurse practitioner Alvester Chou, for evaluation of migraine headache, initial evaluation was on Sep 27, 2017.  She had a history of hypertension, hyperlipidemia, chronic insomnia, reported long history of chronic migraine headache.  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 Domingo Cocking, per patient, she has tried and failed gabapentin up to 3600 mg, Botox injection, still on polypharmacy treatment, including Seroquel 50 mg every day, Cymbalta 30 mg daily, clonazepam 0.5 mg 3 times a day  In addition, she is also under pain management, taking oxycodone 5 mg 4 times a day  She used to have intermittent headaches, but gradually getting worse since 2018, become a daily headache at the end of 2018, over the past 6 months, she was given prescription of Fioricet, 4 to 6 tablets on a daily basis, she has moderate to severe daily holoacranial headaches, with light noise sensitivity, debilitated from her headache.  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  At that time she was started on nortriptyline titrating to 20 mg every night, Aimovig, Maxalt and Zofran as needed.  She had MRI of the brain in June 2019 showing no acute abnormality, mild supratentorium small vessel disease, MRV of the brain showed decreased signal at left venous drainage system.  She was to see her ophthalmologist to rule out papillary edema.  She was seen in our office on July 2019 for a nerve block after a 3-week history of intractable headache.  Nerve  block did not relieve her headache. She came to the office on November 28, 2017 for IV infusion for migraine cocktail.  She came in in August 2019, November 2019 for IV infusion of migraine cocktail.   Today she is complaining of a migraine headache for the past 2 weeks.  She reports that she has daily headaches.  She reports she has been feeling depressed because she has daily headaches however she has been doing meditation to try to improve this.  She is currently taking 70 mg Aimovig, 20 mg nortriptyline, Maxalt and Zofran.  She does report some benefit with Aimovig. She describes the headache pain as occurring to the top of her head.  She reports she followed up with her eye doctor and that they were able to rule out papillary edema.  She reports she is having to take next dose of Maxalt every month.  She presents today for follow-up.  Today she is requesting to have IV infusion of migraine cocktail.   UPDATE January 25, 2019: Her migraine is under excellent control with current combination, metoprolol 50 mg twice a day, aimovig 140 mg once every month, she is taking Maxalt, occasional ubrelvy as needed for abortive treatment,  REVIEW OF SYSTEMS: Out of a complete 14 system review of symptoms, the patient complains only of the following symptoms, and all other reviewed systems are negative.  ALLERGIES: Allergies  Allergen Reactions  . Lamotrigine Rash    Only at 100 mg is intolerable-gets a rash from head to toe  . Penicillins Rash  HOME MEDICATIONS: Outpatient Medications Prior to Visit  Medication Sig Dispense Refill  . AIMOVIG 140 MG/ML SOAJ INJECT 140MG INTO THE SKIN EVERY 30 DAYS 1 mL 0  . atorvastatin (LIPITOR) 20 MG tablet Take 20 mg by mouth daily.    . Cholecalciferol (VITAMIN D3) 2000 UNITS TABS Take 2,000 Units by mouth daily.     Marland Kitchen JANTOVEN 5 MG tablet Take 5 mg by mouth daily.     Marland Kitchen lisinopril (PRINIVIL,ZESTRIL) 10 MG tablet Take 10 mg by mouth daily.     . methocarbamol  (ROBAXIN) 750 MG tablet Take 750 mg by mouth as needed.     . metoprolol (LOPRESSOR) 50 MG tablet Take 50 mg by mouth 2 (two) times daily.     . nortriptyline (PAMELOR) 10 MG capsule TAKE TWO CAPSULES BY MOUTH DAILY  60 capsule 5  . ondansetron (ZOFRAN-ODT) 4 MG disintegrating tablet Take one tablet every 8 hours as needed.  #20/30days 20 tablet 5  . Oxycodone HCl 10 MG TABS Take 10 mg by mouth 4 (four) times daily.    Marland Kitchen perphenazine (TRILAFON) 4 MG tablet 3 (three) times daily.    . QUEtiapine (SEROQUEL) 200 MG tablet Take 200 mg by mouth at bedtime.    . rizatriptan (MAXALT-MLT) 10 MG disintegrating tablet DISSOLVE 1 TABLET BY MOUTH AS NEEDED. MAY REPEAT IN 2 HOURS IF NEEDED 12 tablet 0  . spironolactone (ALDACTONE) 50 MG tablet Take 50 mg by mouth. Take 0.5 tablet daily.    Marland Kitchen Ubrogepant (UBRELVY) 50 MG TABS Take 50 mg by mouth as needed. Can take 50 mg, can take additional 50 mg 2 hours after 1st dose; not to exceed to 100 mg in 24 hours 10 tablet 5  . Diclofenac Potassium (CAMBIA) 50 MG PACK Take 1 tab at onset of migraine.  May repeat in 2 hrs, if needed.  Max dose: 2 packs/day. This is a 30 day prescription. 9 each 6  . Erenumab-aooe (AIMOVIG) 70 MG/ML SOAJ Inject 70 mg into the skin every 30 (thirty) days. 1 pen 11  . oxyCODONE (OXY IR/ROXICODONE) 5 MG immediate release tablet Take 10 mg by mouth 4 (four) times daily as needed.     Marland Kitchen QUEtiapine (SEROQUEL) 50 MG tablet For a good night sleep up to 3 tabs/143m a night     No facility-administered medications prior to visit.     PAST MEDICAL HISTORY: Past Medical History:  Diagnosis Date  . Anxiety   . Migraine   . Numbness    Left leg  . Pelvic pain     PAST SURGICAL HISTORY: Past Surgical History:  Procedure Laterality Date  . ABDOMINAL HYSTERECTOMY     Partial  . INNER EAR SURGERY    . LAPAROSCOPIC OVARIAN CYSTECTOMY     Left    FAMILY HISTORY: Family History  Problem Relation Age of Onset  . Multiple myeloma Mother    . Breast cancer Mother   . Emphysema Father     SOCIAL HISTORY: Social History   Socioeconomic History  . Marital status: Married    Spouse name: Not on file  . Number of children: 1  . Years of education: HS  . Highest education level: Not on file  Occupational History  . Occupation: Unemployed  Social Needs  . Financial resource strain: Not on file  . Food insecurity    Worry: Not on file    Inability: Not on file  . Transportation needs    Medical: Not on  file    Non-medical: Not on file  Tobacco Use  . Smoking status: Current Some Day Smoker    Types: Cigarettes, E-cigarettes  . Smokeless tobacco: Never Used  . Tobacco comment: Smokes 0.25 packs/week - uses vapor  Substance and Sexual Activity  . Alcohol use: No    Alcohol/week: 0.0 standard drinks  . Drug use: No  . Sexual activity: Yes  Lifestyle  . Physical activity    Days per week: Not on file    Minutes per session: Not on file  . Stress: Not on file  Relationships  . Social Herbalist on phone: Not on file    Gets together: Not on file    Attends religious service: Not on file    Active member of club or organization: Not on file    Attends meetings of clubs or organizations: Not on file    Relationship status: Not on file  . Intimate partner violence    Fear of current or ex partner: Not on file    Emotionally abused: Not on file    Physically abused: Not on file    Forced sexual activity: Not on file  Other Topics Concern  . Not on file  Social History Narrative   Lives at home with her husband.   Left-handed.   24 oz caffeine per day.      PHYSICAL EXAM  Vitals:   01/25/19 1455  Temp: (!) 97.3 F (36.3 C)  Weight: 222 lb 8 oz (100.9 kg)  Height: 5' 3"  (1.6 m)   Body mass index is 39.41 kg/m.  Generalized: Well developed, in no acute distress   Neurological examination  Mentation: Alert oriented to time, place, history taking. Follows all commands speech and language  fluent Cranial nerve II-XII: Pupils were equal round reactive to light. Extraocular movements were full, visual field were full on confrontational test. Facial sensation and strength were normal. Uvula tongue midline. Head turning and shoulder shrug  were normal and symmetric. Motor: The motor testing reveals 5 over 5 strength of all 4 extremities. Good symmetric motor tone is noted throughout.  Sensory: Sensory testing is intact to soft touch on all 4 extremities. No evidence of extinction is noted.  Coordination: Cerebellar testing reveals good finger-nose-finger and heel-to-shin bilaterally.  Gait and station: Gait is normal. Tandem gait is normal. Romberg is negative. No drift is seen.  Reflexes: Deep tendon reflexes are symmetric and normal bilaterally.   DIAGNOSTIC DATA (LABS, IMAGING, TESTING) - I reviewed patient records, labs, notes, testing and imaging myself where available.  Lab Results  Component Value Date   WBC 10.7 09/27/2017   HGB 13.0 09/27/2017   HCT 39.9 09/27/2017   MCV 91 09/27/2017   PLT 276 07/24/2011      Component Value Date/Time   NA 138 09/27/2017 1549   K 5.1 09/27/2017 1549   CL 100 09/27/2017 1549   CO2 23 09/27/2017 1549   GLUCOSE 95 09/27/2017 1549   GLUCOSE 103 (H) 07/23/2011 0625   BUN 8 09/27/2017 1549   CREATININE 0.67 09/27/2017 1549   CALCIUM 9.7 09/27/2017 1549   PROT 7.4 09/27/2017 1549   ALBUMIN 4.5 09/27/2017 1549   AST 14 09/27/2017 1549   ALT 19 09/27/2017 1549   ALKPHOS 80 09/27/2017 1549   BILITOT <0.2 09/27/2017 1549   GFRNONAA 104 09/27/2017 1549   GFRAA 120 09/27/2017 1549   No results found for: CHOL, HDL, LDLCALC, LDLDIRECT, TRIG, CHOLHDL No results  found for: HGBA1C Lab Results  Component Value Date   VITAMINB12 441 09/27/2017   Lab Results  Component Value Date   TSH 2.480 09/27/2017   ASSESSMENT AND PLAN 48 y.o. year old   Chronic migraine headaches  Continue metoprolol 50 mg twice a day, aimovig 140 mg once  every month as preventive medication  Maxalt, Roselyn Meier as needed for abortive treatment   Marcial Pacas, M.D. Ph.D.  [facility] [place], [place] [address] Phone: [phone] Fax:      [phone]

## 2019-02-18 ENCOUNTER — Other Ambulatory Visit: Payer: Self-pay | Admitting: Neurology

## 2019-02-24 ENCOUNTER — Other Ambulatory Visit: Payer: Self-pay | Admitting: Neurology

## 2019-03-07 ENCOUNTER — Telehealth: Payer: Self-pay | Admitting: Neurology

## 2019-03-07 NOTE — Telephone Encounter (Signed)
[place] sent a message and relayed a message stating that patient had declined referral .

## 2019-03-15 ENCOUNTER — Telehealth: Payer: Self-pay | Admitting: *Deleted

## 2019-03-15 NOTE — Telephone Encounter (Signed)
PA for Aimovig submitted to [employer] [phone]) through [email or web address].  Key:[id number]. Decision pending.

## 2019-03-19 NOTE — Telephone Encounter (Signed)
I called [employer] [phone]) to check on PA.  I was informed that Aimovig does not require a prior authorization.  The pharmacy received a reject because they were attempting to fill the medication too soon.  She last received the medication on 02/26/2019.  Her next eligible refill date will be 03/22/2019.  [facility] has been notified.

## 2019-05-28 ENCOUNTER — Telehealth: Payer: Self-pay | Admitting: *Deleted

## 2019-05-28 ENCOUNTER — Other Ambulatory Visit: Payer: Self-pay | Admitting: Neurology

## 2019-05-28 NOTE — Telephone Encounter (Signed)
PA started for Aimovig 140 mg on covermymeds (key: [id number]).  Pt has coverage through [employer] [phone]).  Pt ID# [id number].  Decision pending.  Pt using [employer] in [place] [phone]).

## 2019-05-29 NOTE — Telephone Encounter (Signed)
Received fax from [employer] that PA approved until 11/25/2019. [id number].

## 2019-06-25 ENCOUNTER — Telehealth: Payer: Self-pay | Admitting: *Deleted

## 2019-06-25 MED ORDER — UBRELVY 50 MG PO TABS: 1.0000 | ORAL_TABLET | ORAL | 2 refills | Status: DC | PRN

## 2019-06-25 NOTE — Telephone Encounter (Signed)
PA for Ubrelvy approved through 06/12/2020. Pt has coverage through [employer] [phone]). Pt [phone]. XA#[id number]. Her insurance plan will cover 8 tablets per 24 days.

## 2019-07-26 ENCOUNTER — Other Ambulatory Visit: Payer: Self-pay

## 2019-07-26 ENCOUNTER — Encounter: Payer: Self-pay | Admitting: Neurology

## 2019-07-26 ENCOUNTER — Ambulatory Visit: Payer: 59 | Admitting: Neurology

## 2019-07-26 VITALS — BP 119/80 | HR 75 | Temp 96.8°F | Ht 63.0 in | Wt 216.5 lb

## 2019-07-26 DIAGNOSIS — G4700 Insomnia, unspecified: Secondary | ICD-10-CM

## 2019-07-26 DIAGNOSIS — F32A Depression, unspecified: Secondary | ICD-10-CM | POA: Insufficient documentation

## 2019-07-26 DIAGNOSIS — IMO0002 Reserved for concepts with insufficient information to code with codable children: Secondary | ICD-10-CM

## 2019-07-26 DIAGNOSIS — G43709 Chronic migraine without aura, not intractable, without status migrainosus: Secondary | ICD-10-CM

## 2019-07-26 DIAGNOSIS — F329 Major depressive disorder, single episode, unspecified: Secondary | ICD-10-CM | POA: Diagnosis not present

## 2019-07-26 MED ORDER — VENLAFAXINE HCL ER 37.5 MG PO CP24: 37.5000 mg | ORAL_CAPSULE | Freq: Every day | ORAL | 11 refills | Status: DC

## 2019-07-26 NOTE — Progress Notes (Signed)
PATIENT: Kendra Parker DOB: 1971/02/10  REASON FOR VISIT: follow up HISTORY FROM: patient  HISTORY OF PRESENT ILLNESS: Today 07/26/19  HISTORY 09/27/2017 YY: Kendra Parker is a 49 year old female, seen in refer by her primary care nurse practitioner Kendra Parker, for evaluation of migraine headache, initial evaluation was on Sep 27, 2017.  She had a history of hypertension, hyperlipidemia, chronic insomnia, reported long history of chronic migraine headache.  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 Domingo Parker, per patient, she has tried and failed gabapentin up to 3600 mg, Botox injection, still on polypharmacy treatment, including Seroquel 50 mg every day, Cymbalta 30 mg daily, clonazepam 0.5 mg 3 times a day  In addition, she is also under pain management, taking oxycodone 5 mg 4 times a day  She used to have intermittent headaches, but gradually getting worse since 2018, become a daily headache at the end of 2018, over the past 6 months, she was given prescription of Fioricet, 4 to 6 tablets on a daily basis, she has moderate to severe daily holoacranial headaches, with light noise sensitivity, debilitated from her headache.  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, was treated with Coumadin, is no longer taking it  UPDATE January 25, 2019: Her migraine is under excellent control with current combination, metoprolol 50 mg twice a day, aimovig 140 mg once every month, she is taking Maxalt, occasional ubrelvy as needed for abortive treatment,  Update March 4th 2021: She continue complains of frequent migraine headaches, 3-4 times each week, lasting 6 to 8 hours, Kendra Parker works well for her most of the time, but she has to take second dose sometimes she has continued aimovig 140 mg as migraine prevention  She is on  polypharmacy treatment, including Seroquel 200 mg at bedtime, nortriptyline 20 mg every night, still complains of difficulty sleeping, depression,   REVIEW OF SYSTEMS: Out of a complete 14 system review of symptoms, the patient complains only of the following symptoms, and all other reviewed systems are negative.  ALLERGIES: Allergies  Allergen Reactions  . Lamotrigine Rash    Only at 100 mg is intolerable-gets a rash from head to toe  . Penicillins Rash    HOME MEDICATIONS: Outpatient Medications Prior to Visit  Medication Sig Dispense Refill  . AIMOVIG 140 MG/ML SOAJ INJECT 140MG INTO THE SKIN EVERY 30 DAYS 1 mL 5  . atorvastatin (LIPITOR) 20 MG tablet Take 20 mg by mouth daily.    . Cholecalciferol (VITAMIN D3) 2000 UNITS TABS Take 2,000 Units by mouth daily.     Marland Kitchen lisinopril (PRINIVIL,ZESTRIL) 10 MG tablet Take 10 mg by mouth daily.     . methocarbamol (ROBAXIN) 750 MG tablet Take 750 mg by mouth as needed.     . metoprolol (LOPRESSOR) 50 MG tablet Take 50 mg by mouth 2 (two) times daily.     . nortriptyline (PAMELOR) 10 MG capsule TAKE TWO CAPSULES BY MOUTH DAILY  60 capsule 5  . ondansetron (ZOFRAN-ODT) 4 MG disintegrating tablet DISSOLVE ONE TABLET BY MOUTH EVERY EIGHT HOURS AS NEEDED *30 DAY SUPPLY* 20 tablet 5  . Oxycodone HCl 10 MG TABS Take 10 mg by mouth 4 (four) times daily.    Marland Kitchen perphenazine (TRILAFON) 4 MG tablet 3 (three) times daily.    . QUEtiapine (SEROQUEL) 200 MG tablet Take  200 mg by mouth at bedtime.    . rizatriptan (MAXALT-MLT) 10 MG disintegrating tablet DISSOLVE 1 TABLET BY MOUTH AS NEEDED. MAY REPEAT IN 2 HOURS IF NEEDED 12 tablet 0  . spironolactone (ALDACTONE) 50 MG tablet Take 50 mg by mouth. Take 0.5 tablet daily.    Marland Kitchen Ubrogepant (UBRELVY) 50 MG TABS Take 1 tablet by mouth as needed. Take 1 tab at onset of migraine. May repeat in 2 hrs, if needed. Max dose: 2 tabs/day.This is a 30 day prescription. 8 tablet 2  . JANTOVEN 5 MG tablet Take 5 mg by mouth  daily.      No facility-administered medications prior to visit.    PAST MEDICAL HISTORY: Past Medical History:  Diagnosis Date  . Anxiety   . Migraine   . Numbness    Left leg  . Pelvic pain     PAST SURGICAL HISTORY: Past Surgical History:  Procedure Laterality Date  . ABDOMINAL HYSTERECTOMY     Partial  . INNER EAR SURGERY    . LAPAROSCOPIC OVARIAN CYSTECTOMY     Left    FAMILY HISTORY: Family History  Problem Relation Age of Onset  . Multiple myeloma Mother   . Breast cancer Mother   . Emphysema Father     SOCIAL HISTORY: Social History   Socioeconomic History  . Marital status: Married    Spouse name: Not on file  . Number of children: 1  . Years of education: HS  . Highest education level: Not on file  Occupational History  . Occupation: Unemployed  Tobacco Use  . Smoking status: Current Some Day Smoker    Types: Cigarettes, E-cigarettes  . Smokeless tobacco: Never Used  . Tobacco comment: Smokes 0.25 packs/week - uses vapor  Substance and Sexual Activity  . Alcohol use: No    Alcohol/week: 0.0 standard drinks  . Drug use: No  . Sexual activity: Yes  Other Topics Concern  . Not on file  Social History Narrative   Lives at home with her husband.   Left-handed.   24 oz caffeine per day.   Social Determinants of Health   Financial Resource Strain:   . Difficulty of Paying Living Expenses: Not on file  Food Insecurity:   . Worried About Charity fundraiser in the Last Year: Not on file  . Ran Out of Food in the Last Year: Not on file  Transportation Needs:   . Lack of Transportation (Medical): Not on file  . Lack of Transportation (Non-Medical): Not on file  Physical Activity:   . Days of Exercise per Week: Not on file  . Minutes of Exercise per Session: Not on file  Stress:   . Feeling of Stress : Not on file  Social Connections:   . Frequency of Communication with Friends and Family: Not on file  . Frequency of Social Gatherings with  Friends and Family: Not on file  . Attends Religious Services: Not on file  . Active Member of Clubs or Organizations: Not on file  . Attends Archivist Meetings: Not on file  . Marital Status: Not on file  Intimate Partner Violence:   . Fear of Current or Ex-Partner: Not on file  . Emotionally Abused: Not on file  . Physically Abused: Not on file  . Sexually Abused: Not on file      PHYSICAL EXAM  Vitals:   07/26/19 1442  Temp: (!) 96.8 F (36 C)  Weight: 216 lb 8 oz (98.2  kg)  Height: 5' 3"  (1.6 m)   Body mass index is 38.35 kg/m.  Generalized: Well developed, in no acute distress   Neurological examination  Mentation: Alert oriented to time, place, history taking. Follows all commands speech and language fluent Cranial nerve II-XII: Pupils were equal round reactive to light. Extraocular movements were full, visual field were full on confrontational test. Facial sensation and strength were normal. Uvula tongue midline. Head turning and shoulder shrug  were normal and symmetric. Motor: The motor testing reveals 5 over 5 strength of all 4 extremities. Good symmetric motor tone is noted throughout.  Sensory: Sensory testing is intact to soft touch on all 4 extremities. No evidence of extinction is noted.  Coordination: Cerebellar testing reveals good finger-nose-finger and heel-to-shin bilaterally.  Gait and station: Gait is normal. Tandem gait is normal. Romberg is negative. No drift is seen.  Reflexes: Deep tendon reflexes are symmetric and normal bilaterally.   DIAGNOSTIC DATA (LABS, IMAGING, TESTING) - I reviewed patient records, labs, notes, testing and imaging myself where available.  Lab Results  Component Value Date   WBC 10.7 09/27/2017   HGB 13.0 09/27/2017   HCT 39.9 09/27/2017   MCV 91 09/27/2017   PLT 276 07/24/2011      Component Value Date/Time   NA 138 09/27/2017 1549   K 5.1 09/27/2017 1549   CL 100 09/27/2017 1549   CO2 23 09/27/2017  1549   GLUCOSE 95 09/27/2017 1549   GLUCOSE 103 (H) 07/23/2011 0625   BUN 8 09/27/2017 1549   CREATININE 0.67 09/27/2017 1549   CALCIUM 9.7 09/27/2017 1549   PROT 7.4 09/27/2017 1549   ALBUMIN 4.5 09/27/2017 1549   AST 14 09/27/2017 1549   ALT 19 09/27/2017 1549   ALKPHOS 80 09/27/2017 1549   BILITOT <0.2 09/27/2017 1549   GFRNONAA 104 09/27/2017 1549   GFRAA 120 09/27/2017 1549   No results found for: CHOL, HDL, LDLCALC, LDLDIRECT, TRIG, CHOLHDL No results found for: HGBA1C Lab Results  Component Value Date   VITAMINB12 441 09/27/2017   Lab Results  Component Value Date   TSH 2.480 09/27/2017   ASSESSMENT AND PLAN 49 y.o. year old   Chronic migraine headaches Depression anxiety, insomnia  Continue metoprolol 50 mg twice a day, aimovig 140 mg once every month as preventive medication, not sure about the benefit of nortriptyline, will stop nortriptyline,  Start Effexor XR 37.5 mg every morning  Maxalt, Ubrelvy, Zofran as needed for abortive treatment   Marcial Pacas, M.D. Ph.D.  [facility] [place], [place] [address] Phone: [phone] Fax:      [phone]

## 2019-07-29 ENCOUNTER — Other Ambulatory Visit: Payer: Self-pay | Admitting: Neurology

## 2019-08-05 ENCOUNTER — Other Ambulatory Visit: Payer: Self-pay | Admitting: Neurology

## 2019-08-08 ENCOUNTER — Other Ambulatory Visit: Payer: Self-pay | Admitting: Neurology

## 2019-08-13 ENCOUNTER — Other Ambulatory Visit: Payer: Self-pay | Admitting: Neurology

## 2019-08-16 ENCOUNTER — Telehealth: Payer: Self-pay | Admitting: Neurology

## 2019-08-16 NOTE — Telephone Encounter (Signed)
She reported no real benefit w/ nortriptyline at her last office visit this month. The medication was stopped and Dr. Terrace Arabia placed her on venlafaxine XR 37.5mg , one capsule daily.   I returned the call to the patient. She was in agreement that the nortriptyline was not very helpful. She did not realized venlafaxine was sent in to replace it. She is in agreement to start the new medication.

## 2019-08-16 NOTE — Telephone Encounter (Signed)
1) Medication(s) Requested (by name): nortriptyline (PAMELOR) 10 MG capsule [Pharmacy Med Name: Nortriptyline HCl Oral Capsule 10 MG]   2) Pharmacy of Choice: [id number] - [place], [place] - [address]  [address], [place] [address]  3) Special Requests:  Pt has been out for a week

## 2019-08-19 ENCOUNTER — Other Ambulatory Visit: Payer: Self-pay | Admitting: Neurology

## 2019-09-16 ENCOUNTER — Other Ambulatory Visit: Payer: Self-pay | Admitting: Neurology

## 2019-09-17 ENCOUNTER — Other Ambulatory Visit: Payer: Self-pay | Admitting: *Deleted

## 2019-09-17 MED ORDER — UBRELVY 50 MG PO TABS: 1.0000 | ORAL_TABLET | ORAL | 11 refills | Status: DC | PRN

## 2019-09-20 ENCOUNTER — Telehealth: Payer: Self-pay | Admitting: *Deleted

## 2019-09-20 NOTE — Telephone Encounter (Signed)
PA for Ubrelvy completed on covermymeds (key: [id number]). Pt has coverage through [employer] ([id number]). Decision pending.

## 2019-09-21 NOTE — Telephone Encounter (Signed)
FT#[id number]. Approved from 06/25/19 to 06/24/2020.

## 2019-10-29 ENCOUNTER — Telehealth: Payer: Self-pay | Admitting: *Deleted

## 2019-10-29 NOTE — Telephone Encounter (Signed)
PA for Aimovig started through covermymeds (key: [facility]). Pt has coverage with [employer] [phone]). Decision pending.

## 2019-10-30 ENCOUNTER — Telehealth: Payer: Self-pay | Admitting: *Deleted

## 2019-10-30 NOTE — Telephone Encounter (Signed)
[id number]. Approved through 04/29/2020.

## 2019-10-30 NOTE — Telephone Encounter (Addendum)
PA for Bernita Raisin completed over the phone with [employer] due to issues w/ covermymeds (JP#[id number]). KU#[id number]. Approved through 10/30/2020.

## 2019-12-17 ENCOUNTER — Telehealth: Payer: Self-pay | Admitting: Neurology

## 2019-12-17 NOTE — Telephone Encounter (Signed)
I returned the call to Aimovig and spoke to [place]. They were missing the patient authorization page but have obtained it from the patient now.  They transferred me to speak to a pharmacist to clarify the Aimovig prescription for patient assistance. I was routed to the physician pharmacy voicemail. I left rx information for Aimovig 140mg , 59ml x 3 refills for 90 days or they may also fill for 79ml x 11 refills for 30 days. Provided our number to call back with any questions.

## 2019-12-17 NOTE — Telephone Encounter (Signed)
Stacy @ [facility] is asking for a call from RN [id number] for [employer]

## 2019-12-20 ENCOUNTER — Telehealth: Payer: Self-pay | Admitting: Neurology

## 2019-12-20 MED ORDER — AIMOVIG 140 MG/ML ~~LOC~~ SOAJ: SUBCUTANEOUS | 11 refills | Status: DC

## 2019-12-20 NOTE — Telephone Encounter (Signed)
Toniann Fail from [employer] called stating the pt is needing a refill for her AIMOVIG 140 MG/ML SOAJ and this needs to be faxed over to [phone] Please advise.

## 2019-12-20 NOTE — Telephone Encounter (Signed)
Dr. Terrace Arabia is out of the office this afternoon. Rx printed, signed by Dr. Lucia Gaskins and faxed to [employer]. Confirmation received.

## 2020-01-30 ENCOUNTER — Ambulatory Visit: Payer: 59 | Admitting: Neurology

## 2020-04-10 ENCOUNTER — Ambulatory Visit: Payer: 59 | Admitting: Neurology

## 2020-05-05 ENCOUNTER — Telehealth: Payer: Self-pay | Admitting: *Deleted

## 2020-05-05 NOTE — Telephone Encounter (Addendum)
PA for Ubrelvy 50mg  started on covermymeds (key: [id number]). Pt has coverage through [employer] [phone]). PA approved through 07/27/2020.

## 2020-07-10 ENCOUNTER — Encounter: Payer: Self-pay | Admitting: Neurology

## 2020-07-10 ENCOUNTER — Ambulatory Visit: Payer: BC Managed Care – PPO | Admitting: Neurology

## 2020-07-10 VITALS — BP 127/87 | HR 78 | Ht 63.0 in | Wt 223.0 lb

## 2020-07-10 DIAGNOSIS — G43711 Chronic migraine without aura, intractable, with status migrainosus: Secondary | ICD-10-CM | POA: Diagnosis not present

## 2020-07-10 DIAGNOSIS — F32A Depression, unspecified: Secondary | ICD-10-CM | POA: Diagnosis not present

## 2020-07-10 MED ORDER — UBRELVY 50 MG PO TABS: 1.0000 | ORAL_TABLET | ORAL | 11 refills | Status: DC | PRN

## 2020-07-10 MED ORDER — AJOVY 225 MG/1.5ML ~~LOC~~ SOAJ: 225.0000 mg | SUBCUTANEOUS | 11 refills | Status: DC | PRN

## 2020-07-10 MED ORDER — VENLAFAXINE HCL ER 37.5 MG PO CP24: 37.5000 mg | ORAL_CAPSULE | Freq: Every day | ORAL | 11 refills | Status: AC

## 2020-07-10 NOTE — Patient Instructions (Signed)
Stop the Trilafon, worry it may be causing the mouth movements Start the [name] for migraine prevention  Take Bernita Raisin as needed for acute headache  See you back 4 months

## 2020-07-10 NOTE — Progress Notes (Signed)
PATIENT: Kendra Parker DOB: 10-23-1970  REASON FOR VISIT: follow up HISTORY FROM: patient  HISTORY OF PRESENT ILLNESS: Today 07/10/20  HISTORY Kendra Parker a 50 year old female, seen in refer byher primary care nurse practitioner Desmond Dike evaluation of migraine headache, initial evaluation was on Sep 27, 2017.  She had a history of hypertension, hyperlipidemia, chronic insomnia, reported long history of chronic migraine headache.  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 Domingo Cocking, per patient, she has tried and failed gabapentin up to 3600 mg, Botox injection, still on polypharmacy treatment, including Seroquel 50 mg every day, Cymbalta 30 mg daily, clonazepam0.5 mg 3 times a day  In addition, she is also under pain management, taking oxycodone 5 mg 4 times a day  She used to have intermittent headaches, but gradually getting worse since 2018, become a daily headache at the end of 2018, over the past 6 months, she was given prescription of Fioricet, 4 to 6 tablets on a daily basis, she has moderate to severe daily holoacranial headaches, with light noise sensitivity, debilitated from her headache.  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, was treated with Coumadin, is no longer taking it  UPDATE January 25, 2019: Her migraine is under excellent control with current combination, metoprolol 50 mg twice a day, aimovig 140 mg once every month, she is taking Maxalt, occasional ubrelvy as needed for abortive treatment,  Update March 4th 2021: She continue complains of frequent migraine headaches, 3-4 times each week, lasting 6 to 8 hours, Roselyn Meier works well for her most of the time, but she has to take second dose sometimes she has continued aimovig 140 mg as migraine prevention  She is on  polypharmacy treatment, including Seroquel 200 mg at bedtime, nortriptyline 20 mg every night, still complains of difficulty sleeping, depression,  Update July 10, 2020 SS: Is no longer on Aimovig, tried it for 1 year, wasn't helping at all. Reports daily migraine, Roselyn Meier works if she catches it early. Tried Botox in past, wasn't helpful. Doesn't working, denies anxiety, doesn't drive. Takes Seroquel to help sleep. Reports walks around her block several times, no frequent caffeine, no OTC medications. Is off the oxycodone, didn't like being on it, on it for back pain. Tried Topamax, Depakote.  Thinks Effexor is helping.  Prescribed perphenazine, was previously on from Dr. Domingo Cocking, reports takes 4 mg daily for migraine prevention. I note frequent mouth movements with lips, jaw, reports husband has mentioned this too, going for almost year.   REVIEW OF SYSTEMS: Out of a complete 14 system review of symptoms, the patient complains only of the following symptoms, and all other reviewed systems are negative.  Headache  ALLERGIES: Allergies  Allergen Reactions  . Lamotrigine Rash    Only at 100 mg is intolerable-gets a rash from head to toe  . Penicillins Rash    HOME MEDICATIONS: Outpatient Medications Prior to Visit  Medication Sig Dispense Refill  . atorvastatin (LIPITOR) 20 MG tablet Take 20 mg by mouth daily.    . Cholecalciferol (VITAMIN D3) 2000 UNITS TABS Take 2,000 Units by mouth daily.     Marland Kitchen lisinopril (PRINIVIL,ZESTRIL) 10 MG tablet Take 10 mg by mouth daily.     . methocarbamol (ROBAXIN) 750 MG tablet Take 750 mg by mouth as needed.     . metoprolol (  LOPRESSOR) 50 MG tablet Take 50 mg by mouth 2 (two) times daily.     . ondansetron (ZOFRAN-ODT) 4 MG disintegrating tablet DISSOLVE ONE TABLET BY MOUTH EVERY EIGHT HOURS AS NEEDED *30 DAY SUPPLY* 20 tablet 5  . perphenazine (TRILAFON) 4 MG tablet 3 (three) times daily.    . QUEtiapine (SEROQUEL) 200 MG tablet Take 200 mg by mouth at  bedtime.    Marland Kitchen spironolactone (ALDACTONE) 50 MG tablet Take 50 mg by mouth. Take 0.5 tablet daily.    Marland Kitchen Ubrogepant (UBRELVY) 50 MG TABS Take 1 tablet by mouth as needed. Take 1 tab at onset of migraine. May repeat in 2 hrs, if needed. Max dose: 2 tabs/day.This is a 30 day prescription. 8 tablet 11  . venlafaxine XR (EFFEXOR XR) 37.5 MG 24 hr capsule Take 1 capsule (37.5 mg total) by mouth daily with breakfast. 30 capsule 11  . Erenumab-aooe (AIMOVIG) 140 MG/ML SOAJ INJECT 140MG INTO THE SKIN EVERY 30 DAYS 1 mL 11  . Oxycodone HCl 10 MG TABS Take 10 mg by mouth 4 (four) times daily.    . rizatriptan (MAXALT-MLT) 10 MG disintegrating tablet DISSOLVE 1 TABLET BY MOUTH AS NEEDED. MAY REPEAT IN 2 HOURS IF NEEDED 12 tablet 0   No facility-administered medications prior to visit.    PAST MEDICAL HISTORY: Past Medical History:  Diagnosis Date  . Anxiety   . Migraine   . Numbness    Left leg  . Pelvic pain     PAST SURGICAL HISTORY: Past Surgical History:  Procedure Laterality Date  . ABDOMINAL HYSTERECTOMY     Partial  . INNER EAR SURGERY    . LAPAROSCOPIC OVARIAN CYSTECTOMY     Left    FAMILY HISTORY: Family History  Problem Relation Age of Onset  . Multiple myeloma Mother   . Breast cancer Mother   . Emphysema Father     SOCIAL HISTORY: Social History   Socioeconomic History  . Marital status: Married    Spouse name: Not on file  . Number of children: 1  . Years of education: HS  . Highest education level: Not on file  Occupational History  . Occupation: Unemployed  Tobacco Use  . Smoking status: Current Some Day Smoker    Types: Cigarettes, E-cigarettes  . Smokeless tobacco: Never Used  . Tobacco comment: Smokes 0.25 packs/week - uses vapor  Vaping Use  . Vaping Use: Every day  Substance and Sexual Activity  . Alcohol use: No    Alcohol/week: 0.0 standard drinks  . Drug use: No  . Sexual activity: Yes  Other Topics Concern  . Not on file  Social History  Narrative   Lives at home with her husband.   Left-handed.   24 oz caffeine per day.   Social Determinants of Health   Financial Resource Strain: Not on file  Food Insecurity: Not on file  Transportation Needs: Not on file  Physical Activity: Not on file  Stress: Not on file  Social Connections: Not on file  Intimate Partner Violence: Not on file   PHYSICAL EXAM  Vitals:   07/10/20 1105  BP: 127/87  Pulse: 78  Weight: 223 lb (101.2 kg)  Height: _0  (1.6 m)   Body mass index is 39.5 kg/m.  Generalized: Well developed, in no acute distress   Neurological examination  Mentation: Alert oriented to time, place, history taking. Follows all commands speech and language fluent Cranial nerve II-XII: Pupils were equal round reactive to light.  Extraocular movements were full, visual field were full on confrontational test. Facial sensation and strength were normal. Head turning and shoulder shrug were normal and symmetric.  Frequent lip puckering movements, jaw thrust Motor: The motor testing reveals 5 over 5 strength of all 4 extremities. Good symmetric motor tone is noted throughout.  Sensory: Sensory testing is intact to soft touch on all 4 extremities. No evidence of extinction is noted.  Coordination: Cerebellar testing reveals good finger-nose-finger and heel-to-shin bilaterally.  Gait and station: Gait is normal. Reflexes: Deep tendon reflexes are symmetric and normal bilaterally.   DIAGNOSTIC DATA (LABS, IMAGING, TESTING) - I reviewed patient records, labs, notes, testing and imaging myself where available.  Lab Results  Component Value Date   WBC 10.7 09/27/2017   HGB 13.0 09/27/2017   HCT 39.9 09/27/2017   MCV 91 09/27/2017   PLT 276 07/24/2011      Component Value Date/Time   NA 138 09/27/2017 1549   K 5.1 09/27/2017 1549   CL 100 09/27/2017 1549   CO2 23 09/27/2017 1549   GLUCOSE 95 09/27/2017 1549   GLUCOSE 103 (H) 07/23/2011 0625   BUN 8 09/27/2017 1549    CREATININE 0.67 09/27/2017 1549   CALCIUM 9.7 09/27/2017 1549   PROT 7.4 09/27/2017 1549   ALBUMIN 4.5 09/27/2017 1549   AST 14 09/27/2017 1549   ALT 19 09/27/2017 1549   ALKPHOS 80 09/27/2017 1549   BILITOT <0.2 09/27/2017 1549   GFRNONAA 104 09/27/2017 1549   GFRAA 120 09/27/2017 1549   No results found for: CHOL, HDL, LDLCALC, LDLDIRECT, TRIG, CHOLHDL No results found for: HGBA1C Lab Results  Component Value Date   VITAMINB12 441 09/27/2017   Lab Results  Component Value Date   TSH 2.480 09/27/2017   ASSESSMENT AND PLAN 50 y.o. year old female  has a past medical history of Anxiety, Migraine, Numbness, and Pelvic pain. here with:  1.  Chronic migraine headaches 2.  Depression, anxiety, insomnia -Reports near daily migraine headache -Add on [name] 225 mg monthly injection for migraine prevention -Continue Effexor XR 37.5 mg daily for migraine prevention -Already taking metoprolol from PCP -Continue Ubrelvy, Zofran as needed for abortive therapy -I do see some evidence of possible tardive dyskinesia on exam with frequent mouth movements, question if side effect from taking perphenazine she says is for migraine abortive relief, she is taking daily, I would recommend stopping this to see if improvement -Follow-up in 3 to 4 months or sooner if needed  I spent 30 minutes of face-to-face and non-face-to-face time with patient.  This included previsit chart review, lab review, study review, order entry, electronic health record documentation, patient education.  Butler Denmark, AGNP-C, DNP 07/10/2020, 11:30 AM [facility] [address] [place], [place] [address] [phone]

## 2020-07-14 ENCOUNTER — Telehealth: Payer: Self-pay | Admitting: *Deleted

## 2020-07-14 NOTE — Telephone Encounter (Signed)
PA approved. Effective from 07/14/2020 through 10/05/2020.

## 2020-07-14 NOTE — Telephone Encounter (Signed)
PA for [name] 225mg  completed on covermymeds (key: [id number]). Pt has commercial coverage through [place] [employer] [place] [phone]). Decision pending.

## 2020-08-04 ENCOUNTER — Telehealth: Payer: Self-pay | Admitting: *Deleted

## 2020-08-04 NOTE — Telephone Encounter (Signed)
PA for Ubrelvy 50mg  started on covermymeds (key: ). Pt has coverage through [place] [employer] [place] [phone]). Decision pending.

## 2020-08-05 NOTE — Telephone Encounter (Signed)
PA approved through 08/03/2021.

## 2020-09-22 ENCOUNTER — Telehealth: Payer: Self-pay

## 2020-09-22 NOTE — Telephone Encounter (Signed)
I have submitted a PA request for [name] on [facility], Key: [id number] - PA Case ID: [id number]  Awaiting determination from [facility]

## 2020-10-13 ENCOUNTER — Telehealth: Payer: Self-pay | Admitting: Neurology

## 2020-10-13 NOTE — Telephone Encounter (Signed)
[facility] Victorino Dike) verifying you have received PA for Ubrogepant (UBRELVY) 50 MG TABS. Have sent maybe twice. Would like a call from the nurse.

## 2020-10-13 NOTE — Telephone Encounter (Signed)
Pt has [employer] now.  [facility] (requiring 2 triptan uses (samatriptan and naratriptan)  [phone] UHC ID [id number] BIN [id number] PCN [id number] GRP [employer]HC.

## 2020-10-13 NOTE — Telephone Encounter (Signed)
Called and pt has new insurance [employer] ID [id number], BIC [id number], PCN [id number] CRP [employer]HC  Tried and failed. Gabapentin, BOTOC cymbalta clonazepam, fiorcet, metoprolol, aimovig, maxalt, rizatriptan, sumatriptan, nortriptyline, venlafaxine, zofran. (Key: [id number]) initiated as expedited 10-13-20.

## 2020-10-15 NOTE — Telephone Encounter (Signed)
Received that [id number] was approved thru 10-13-2021. I spoke to pt and let her know after I faxed the approval notice from [facility] to [facility].

## 2020-11-20 ENCOUNTER — Telehealth: Payer: Self-pay | Admitting: Neurology

## 2020-11-20 ENCOUNTER — Ambulatory Visit: Payer: Self-pay | Admitting: Neurology

## 2020-11-20 DIAGNOSIS — Z0289 Encounter for other administrative examinations: Secondary | ICD-10-CM

## 2020-11-20 NOTE — Progress Notes (Deleted)
PATIENT: Kendra Parker DOB: [birth year]  REASON FOR VISIT: follow up Kendra FROM: patient  Kendra OF PRESENT ILLNESS: Today 11/20/20  Kendra Parker is a 50 year old female, seen in refer by her primary care nurse practitioner Alvester Chou, for evaluation of migraine headache, initial evaluation was on Sep 27, 2017.   She had a Kendra of hypertension, hyperlipidemia, chronic insomnia, reported long Kendra of chronic migraine headache.   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 Domingo Cocking, per patient, she has tried and failed gabapentin up to 3600 mg, Botox injection, still on polypharmacy treatment, including Seroquel 50 mg every day, Cymbalta 30 mg daily, clonazepam 0.5 mg 3 times a day   In addition, she is also under pain management, taking oxycodone 5 mg 4 times a day   She used to have intermittent headaches, but gradually getting worse since 2018, become a daily headache at the end of 2018, over the past 6 months, she was given prescription of Fioricet, 4 to 6 tablets on a daily basis, she has moderate to severe daily holoacranial headaches, with light noise sensitivity, debilitated from her headache.   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, was treated with Coumadin, is no longer taking it   UPDATE January 25, 2019: Her migraine is under excellent control with current combination, metoprolol 50 mg twice a day, aimovig 140 mg once every month, she is taking Maxalt, occasional ubrelvy as needed for abortive treatment,   Update March 4th 2021: She continue complains of frequent migraine headaches, 3-4 times each week, lasting 6 to 8 hours, Roselyn Meier works well for her most of the time, but she has to take second dose sometimes she has continued aimovig 140 mg as migraine prevention   She is on  polypharmacy treatment, including Seroquel 200 mg at bedtime, nortriptyline 20 mg every night, still complains of difficulty sleeping, depression,  Update July 10, 2020 SS: Is no longer on Aimovig, tried it for 1 year, wasn't helping at all. Reports daily migraine, Roselyn Meier works if she catches it early. Tried Botox in past, wasn't helpful. Doesn't working, denies anxiety, doesn't drive. Takes Seroquel to help sleep. Reports walks around her block several times, no frequent caffeine, no OTC medications. Is off the oxycodone, didn't like being on it, on it for back pain. Tried Topamax, Depakote.  Thinks Effexor is helping.  Prescribed perphenazine, was previously on from Dr. Domingo Cocking, reports takes 4 mg daily for migraine prevention. I note frequent mouth movements with lips, jaw, reports husband has mentioned this too, going for almost year.   Update November 20, 2020 SS:   REVIEW OF SYSTEMS: Out of a complete 14 system review of symptoms, the patient complains only of the following symptoms, and all other reviewed systems are negative.  Headache  ALLERGIES: Allergies  Allergen Reactions   Lamotrigine Rash    Only at 100 mg is intolerable-gets a rash from head to toe   Penicillins Rash    HOME MEDICATIONS: Outpatient Medications Prior to Visit  Medication Sig Dispense Refill   atorvastatin (LIPITOR) 20 MG tablet Take 20 mg by mouth daily.     Cholecalciferol (VITAMIN D3) 2000 UNITS TABS Take 2,000 Units by mouth daily.      Fremanezumab-vfrm ([name]) 225 MG/1.5ML SOAJ Inject 225 mg into the skin as needed. 1.5  mL 11   lisinopril (PRINIVIL,ZESTRIL) 10 MG tablet Take 10 mg by mouth daily.      methocarbamol (ROBAXIN) 750 MG tablet Take 750 mg by mouth as needed.      metoprolol (LOPRESSOR) 50 MG tablet Take 50 mg by mouth 2 (two) times daily.      ondansetron (ZOFRAN-ODT) 4 MG disintegrating tablet DISSOLVE ONE TABLET BY MOUTH EVERY EIGHT HOURS AS NEEDED *30 DAY SUPPLY* 20 tablet 5    perphenazine (TRILAFON) 4 MG tablet 3 (three) times daily.     QUEtiapine (SEROQUEL) 200 MG tablet Take 200 mg by mouth at bedtime.     spironolactone (ALDACTONE) 50 MG tablet Take 50 mg by mouth. Take 0.5 tablet daily.     Ubrogepant (UBRELVY) 50 MG TABS Take 1 tablet by mouth as needed. Take 1 tab at onset of migraine. May repeat in 2 hrs, if needed. Max dose: 2 tabs/day.This is a 30 day prescription. 8 tablet 11   venlafaxine XR (EFFEXOR XR) 37.5 MG 24 hr capsule Take 1 capsule (37.5 mg total) by mouth daily with breakfast. 30 capsule 11   No facility-administered medications prior to visit.    PAST MEDICAL Kendra: Past Medical Kendra:  Diagnosis Date   Anxiety    Migraine    Numbness    Left leg   Pelvic pain     PAST SURGICAL Kendra: Past Surgical Kendra:  Procedure Laterality Date   ABDOMINAL HYSTERECTOMY     Partial   INNER EAR SURGERY     LAPAROSCOPIC OVARIAN CYSTECTOMY     Left    FAMILY Kendra: Family Kendra  Problem Relation Age of Onset   Multiple myeloma Mother    Breast cancer Mother    Emphysema Father     SOCIAL Kendra: Social Kendra   Socioeconomic Kendra   Marital status: Married    Spouse name: Not on file   Number of children: 1   Years of education: HS   Highest education level: Not on file  Occupational Kendra   Occupation: Unemployed  Tobacco Use   Smoking status: Some Days    Pack years: 0.00    Types: Cigarettes, E-cigarettes   Smokeless tobacco: Never   Tobacco comments:    Smokes 0.25 packs/week - uses vapor  Vaping Use   Vaping Use: Every day  Substance and Sexual Activity   Alcohol use: No    Alcohol/week: 0.0 standard drinks   Drug use: No   Sexual activity: Yes  Other Topics Concern   Not on file  Social Kendra Narrative   Lives at home with her husband.   Left-handed.   24 oz caffeine per day.   Social Determinants of Health   Financial Resource Strain: Not on file  Food Insecurity: Not on file   Transportation Needs: Not on file  Physical Activity: Not on file  Stress: Not on file  Social Connections: Not on file  Intimate Partner Violence: Not on file   PHYSICAL EXAM  There were no vitals filed for this visit.  There is no height or weight on file to calculate BMI.  Generalized: Well developed, in no acute distress   Neurological examination  Mentation: Alert oriented to time, place, Kendra taking. Follows all commands speech and language fluent Cranial nerve II-XII: Pupils were equal round reactive to light. Extraocular movements were full, visual field were full on confrontational test. Facial sensation and strength were normal. Head turning and shoulder shrug were normal and symmetric.  Frequent  lip puckering movements, jaw thrust Motor: The motor testing reveals 5 over 5 strength of all 4 extremities. Good symmetric motor tone is noted throughout.  Sensory: Sensory testing is intact to soft touch on all 4 extremities. No evidence of extinction is noted.  Coordination: Cerebellar testing reveals good finger-nose-finger and heel-to-shin bilaterally.  Gait and station: Gait is normal. Reflexes: Deep tendon reflexes are symmetric and normal bilaterally.   DIAGNOSTIC DATA (LABS, IMAGING, TESTING) - I reviewed patient records, labs, notes, testing and imaging myself where available.  Lab Results  Component Value Date   WBC 10.7 09/27/2017   HGB 13.0 09/27/2017   HCT 39.9 09/27/2017   MCV 91 09/27/2017   PLT 276 07/24/2011      Component Value Date/Time   NA 138 09/27/2017 1549   K 5.1 09/27/2017 1549   CL 100 09/27/2017 1549   CO2 23 09/27/2017 1549   GLUCOSE 95 09/27/2017 1549   GLUCOSE 103 (H) 07/23/2011 0625   BUN 8 09/27/2017 1549   CREATININE 0.67 09/27/2017 1549   CALCIUM 9.7 09/27/2017 1549   PROT 7.4 09/27/2017 1549   ALBUMIN 4.5 09/27/2017 1549   AST 14 09/27/2017 1549   ALT 19 09/27/2017 1549   ALKPHOS 80 09/27/2017 1549   BILITOT <0.2 09/27/2017  1549   GFRNONAA 104 09/27/2017 1549   GFRAA 120 09/27/2017 1549   No results found for: CHOL, HDL, LDLCALC, LDLDIRECT, TRIG, CHOLHDL No results found for: HGBA1C Lab Results  Component Value Date   VITAMINB12 441 09/27/2017   Lab Results  Component Value Date   TSH 2.480 09/27/2017   ASSESSMENT AND PLAN 50 y.o. year old female  has a past medical Kendra of Anxiety, Migraine, Numbness, and Pelvic pain. here with:  1.  Chronic migraine headaches 2.  Depression, anxiety, insomnia -Reports near daily migraine headache -Add on [name] 225 mg monthly injection for migraine prevention -Continue Effexor XR 37.5 mg daily for migraine prevention -Already taking metoprolol from PCP -Continue Ubrelvy, Zofran as needed for abortive therapy -I do see some evidence of possible tardive dyskinesia on exam with frequent mouth movements, question if side effect from taking perphenazine she says is for migraine abortive relief, she is taking daily, I would recommend stopping this to see if improvement -Follow-up in 3 to 4 months or sooner if needed  I spent 30 minutes of face-to-face and non-face-to-face time with patient.  This included previsit chart review, lab review, study review, order entry, electronic health record documentation, patient education.  Butler Denmark, AGNP-C, DNP 11/20/2020, 5:40 AM [facility] [address], [place], [place] [address] [phone]

## 2020-11-20 NOTE — Telephone Encounter (Signed)
Noted  

## 2020-11-20 NOTE — Telephone Encounter (Signed)
Pt cancelled appt due to having a migraine, can not drive.  Transferred to Billing.

## 2020-11-28 ENCOUNTER — Other Ambulatory Visit: Payer: Self-pay

## 2020-11-28 ENCOUNTER — Emergency Department (HOSPITAL_BASED_OUTPATIENT_CLINIC_OR_DEPARTMENT_OTHER)
Admission: EM | Admit: 2020-11-28 | Discharge: 2020-11-28 | Disposition: A | Discharge status: Home / Self Care | Payer: 59 | Attending: Emergency Medicine | Admitting: Emergency Medicine

## 2020-11-28 ENCOUNTER — Encounter (HOSPITAL_BASED_OUTPATIENT_CLINIC_OR_DEPARTMENT_OTHER): Payer: Self-pay | Admitting: *Deleted

## 2020-11-28 ENCOUNTER — Emergency Department (HOSPITAL_BASED_OUTPATIENT_CLINIC_OR_DEPARTMENT_OTHER): Payer: 59

## 2020-11-28 DIAGNOSIS — S299XXA Unspecified injury of thorax, initial encounter: Secondary | ICD-10-CM | POA: Diagnosis not present

## 2020-11-28 DIAGNOSIS — S8992XA Unspecified injury of left lower leg, initial encounter: Secondary | ICD-10-CM | POA: Diagnosis present

## 2020-11-28 DIAGNOSIS — S3991XA Unspecified injury of abdomen, initial encounter: Secondary | ICD-10-CM | POA: Diagnosis not present

## 2020-11-28 DIAGNOSIS — Z79899 Other long term (current) drug therapy: Secondary | ICD-10-CM | POA: Insufficient documentation

## 2020-11-28 DIAGNOSIS — S80812A Abrasion, left lower leg, initial encounter: Secondary | ICD-10-CM | POA: Insufficient documentation

## 2020-11-28 DIAGNOSIS — R519 Headache, unspecified: Secondary | ICD-10-CM | POA: Insufficient documentation

## 2020-11-28 DIAGNOSIS — M549 Dorsalgia, unspecified: Secondary | ICD-10-CM | POA: Insufficient documentation

## 2020-11-28 DIAGNOSIS — Y92410 Unspecified street and highway as the place of occurrence of the external cause: Secondary | ICD-10-CM | POA: Diagnosis not present

## 2020-11-28 DIAGNOSIS — Z87891 Personal history of nicotine dependence: Secondary | ICD-10-CM | POA: Insufficient documentation

## 2020-11-28 DIAGNOSIS — S298XXA Other specified injuries of thorax, initial encounter: Secondary | ICD-10-CM

## 2020-11-28 LAB — COMPREHENSIVE METABOLIC PANEL
ALT: 22 U/L (ref 0–44)
AST: 20 U/L (ref 15–41)
Albumin: 4.7 g/dL (ref 3.5–5.0)
Alkaline Phosphatase: 89 U/L (ref 38–126)
Anion gap: 9 (ref 5–15)
BUN: 14 mg/dL (ref 6–20)
CO2: 25 mmol/L (ref 22–32)
Calcium: 9.8 mg/dL (ref 8.9–10.3)
Chloride: 104 mmol/L (ref 98–111)
Creatinine, Ser: 0.78 mg/dL (ref 0.44–1.00)
GFR, Estimated: >60 mL/min (ref >60)
Glucose, Bld: 90 mg/dL (ref 70–99)
Potassium: 4.1 mmol/L (ref 3.5–5.1)
Sodium: 138 mmol/L (ref 135–145)
Total Bilirubin: 0.3 mg/dL (ref 0.3–1.2)
Total Protein: 7.5 g/dL (ref 6.5–8.1)

## 2020-11-28 LAB — CBC WITH DIFFERENTIAL/PLATELET
Abs Immature Granulocytes: 0.02 10*3/uL (ref 0.00–0.07)
Basophils Absolute: 0.1 10*3/uL (ref 0.0–0.1)
Basophils Relative: 1 %
Eosinophils Absolute: 0.1 10*3/uL (ref 0.0–0.5)
Eosinophils Relative: 1 %
HCT: 41.1 % (ref 36.0–46.0)
Hemoglobin: 13.3 g/dL (ref 12.0–15.0)
Immature Granulocytes: 0 %
Lymphocytes Relative: 29 %
Lymphs Abs: 2.6 10*3/uL (ref 0.7–4.0)
MCH: 29.6 pg (ref 26.0–34.0)
MCHC: 32.4 g/dL (ref 30.0–36.0)
MCV: 91.5 fL (ref 80.0–100.0)
Monocytes Absolute: 0.6 10*3/uL (ref 0.1–1.0)
Monocytes Relative: 6 %
Neutro Abs: 5.8 10*3/uL (ref 1.7–7.7)
Neutrophils Relative %: 63 %
Platelets: 396 10*3/uL (ref 150–400)
RBC: 4.49 MIL/uL (ref 3.87–5.11)
RDW: 13.1 % (ref 11.5–15.5)
WBC: 9.1 10*3/uL (ref 4.0–10.5)
nRBC: 0 % (ref 0.0–0.2)

## 2020-11-28 MED ORDER — IOHEXOL 300 MG/ML  SOLN
75.0000 mL | Freq: Once | INTRAMUSCULAR | Status: AC | PRN
  Administered 2020-11-28: 75 mL via INTRAVENOUS
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
## 2020-11-28 NOTE — ED Notes (Signed)
Patient transported to CT 
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
## 2020-12-02 NOTE — Telephone Encounter (Signed)
This request was denied because you did not meet the following clinical requirements: Based on the information provided, you do not meet the established medication-specific criteria or guidelines for [name] at this time. The request for coverage for [name] Inj 225/1.5, use as directed (1.5 per month), is denied. This decision is based on health plan criteria for [name]. This medicine is covered only if: You have failed (after a trial of at least two months) or cannot use both of the following (document date tried): (A) Aimovig*. (B) Emgality 120mg *.

## 2020-12-02 NOTE — Telephone Encounter (Signed)
Attempted to call pt, LVM for call back  °

## 2020-12-02 NOTE — Telephone Encounter (Signed)
She has tried Aimovig previously for 1 year. If this is not sufficient to obtain [name] coverage. Can switch to [employer]

## 2021-01-27 ENCOUNTER — Telehealth: Payer: Self-pay | Admitting: Neurology

## 2021-01-27 NOTE — Telephone Encounter (Signed)
I spoke to the patient. She has been provided with the information below. She is willing to try Emgality since it is a preferred medication.

## 2021-01-27 NOTE — Telephone Encounter (Signed)
Pt is asking for a call to discuss the  needed PA she is being told is needed for her [name] thru [employer]

## 2021-01-27 NOTE — Telephone Encounter (Addendum)
Previous documentation:  She has tried Aimovig previously for 1 year. If this is not sufficient to obtain [name] coverage. Can switch to [employer]         Kendra Parker, CMA routed this conversation to Glean Salvo, NP  Kendra Parker, CMA     11:15 AM Note   This request was denied because you did not meet the following clinical requirements: Based on the information provided, you do not meet the established medication-specific criteria or guidelines for [name] at this time. The request for coverage for [name] Inj 225/1.5, use as directed (1.5 per month), is denied. This decision is based on health plan criteria for [name]. This medicine is covered only if: You have failed (after a trial of at least two months) or cannot use both of the following (document date tried): (A) Aimovig*. (B) Emgality 120mg *.

## 2021-01-28 MED ORDER — EMGALITY 120 MG/ML ~~LOC~~ SOAJ: 240.0000 mg | SUBCUTANEOUS | 12 refills | Status: AC

## 2021-01-28 NOTE — Telephone Encounter (Signed)
Meds ordered this encounter  Medications   Galcanezumab-gnlm (EMGALITY) 120 MG/ML SOAJ    Sig: Inject 240 mg into the skin every 30 (thirty) days. Loading dose 120mg  two consecutive injection Then 120mg  monthly    Dispense:  1.12 mL    Refill:  12

## 2021-01-28 NOTE — Telephone Encounter (Signed)
I spoke to the patient. She is aware this prescription has been sent to pharmacy.

## 2021-01-28 NOTE — Addendum Note (Signed)
Addended by: Levert Feinstein on: 01/28/2021 02:57 PM   Modules accepted: Orders

## 2021-01-28 NOTE — Telephone Encounter (Signed)
Pt is asking for a call re: the status of the request for her to start Emgality. Pt made aware that Marcelino Duster, RN documented the request on yesterday and forwarded on to Dr Terrace Arabia.  Pt asked it be documented that this is something she wants to try and have before going out of town this coming Saturday.

## 2021-01-29 ENCOUNTER — Telehealth: Payer: Self-pay | Admitting: *Deleted

## 2021-01-29 NOTE — Telephone Encounter (Signed)
[id number] approved through 02/12/21.

## 2021-01-29 NOTE — Telephone Encounter (Signed)
PA for Emgality started on covermymeds (key: [id number]). Pt has pharmacy coverage through [employer] (BX#[id number]). Decision pending.

## 2021-02-20 ENCOUNTER — Other Ambulatory Visit: Payer: Self-pay | Admitting: Neurology

## 2021-02-20 MED ORDER — METHOCARBAMOL 750 MG PO TABS: ORAL_TABLET | ORAL | 5 refills | Status: AC

## 2021-02-20 NOTE — Telephone Encounter (Signed)
Pt called needing a refill request for her methocarbamol (ROBAXIN) 750 MG tablet sent in to the [facility] on [address].

## 2021-02-20 NOTE — Telephone Encounter (Signed)
In review of the patient's chart, methocarbamol has not been prescribed by our office in the past. I called her for more information. She has been getting this medication from her PCP. She uses it prn for acute migraines. Her PCP informed her that further prescriptions should come from our office. We will send this request to Dr. Terrace Arabia for her approval.

## 2021-02-24 ENCOUNTER — Telehealth: Payer: Self-pay | Admitting: *Deleted

## 2021-02-24 NOTE — Telephone Encounter (Signed)
Liane in [facility] said the patient called her asking for a Depacon infusion. She informed her of the national backorder of this medication. The patient wanted [place] to call her back.   Per vo by Dr. Terrace Arabia, she is able to offer her a work-in appt for a nerve block.  Left message requesting patient to return my call.

## 2021-02-24 NOTE — Telephone Encounter (Signed)
I spoke to the patient. She declined the offer for a nerve block (painful, mild improvement with the one in past). She is going to try her home meds again. If they do not work, she would prefer to seek treatment through an urgent care facility that can offer an alternate migraine cocktail.   Reports just trying her first Emaglity injection this month. I told her we like to give the medication a three month trial. Hopefully, she will notice a reduction in the frequency of her migraines.  I invited her to call our office back if she changes her mind on the nerve block. We are happy to work her into the schedule.

## 2021-03-30 ENCOUNTER — Telehealth: Payer: Self-pay | Admitting: Neurology

## 2021-03-30 DIAGNOSIS — G43711 Chronic migraine without aura, intractable, with status migrainosus: Secondary | ICD-10-CM

## 2021-03-30 NOTE — Telephone Encounter (Signed)
Spoke to pt, states she is taking Robaxin 750mg  tid, along with ubrelvy this week. Pt states she is not interested in Nerve block.  Please advise

## 2021-03-30 NOTE — Telephone Encounter (Signed)
Pt called states the methocarbamol (ROBAXIN) 750 MG tablet is not working. Asking if Dr. Terrace Arabia can prescribe something else. Pt has been dealing with the same headache for a month now.

## 2021-03-30 NOTE — Addendum Note (Signed)
Addended by: Lindell Spar C on: 03/30/2021 05:08 PM   Modules accepted: Orders

## 2021-03-30 NOTE — Telephone Encounter (Signed)
Per vo by Dr. Terrace Arabia, offer a referral to headache clinic. She is agreeable to this plan. She would like to go to [facility]. Referral place in [place].

## 2021-04-02 ENCOUNTER — Telehealth: Payer: Self-pay | Admitting: Neurology

## 2021-04-02 DIAGNOSIS — G43711 Chronic migraine without aura, intractable, with status migrainosus: Secondary | ICD-10-CM

## 2021-04-02 NOTE — Telephone Encounter (Signed)
Pt called, had discuss with Sarah about doubling Ubrogepant (UBRELVY) 50 MG TABS and venlafaxine XR (EFFEXOR XR) 37.5 MG 24 hr capsule. and per insurance have to try Emgality for 3 months. Wonder when can go back on [name] injection which was working. Would like a call from the nurse.

## 2021-04-02 NOTE — Telephone Encounter (Signed)
Previous note in Epic. She has been referred to [facility] headache clinic. Follow up appointment cancelled at our office. She has just taken her second injection of Emgality. She understands this type of medication may take three months to be fully effective. She inquired about going back to [name] after the three months on the other CGRP. This may be an option but will be determined by the new physician she will be seeing at [facility]. She has Ubrelvy 50mg  at home. Her insurance plan was only allowing #8/24. She verbalized understanding.

## 2021-04-06 NOTE — Telephone Encounter (Signed)
Referral sent to [facility] neurology. Phone: [phone].

## 2021-06-01 MED ORDER — QULIPTA 30 MG PO TABS: 30.0000 mg | ORAL_TABLET | Freq: Every day | ORAL | 11 refills | Status: DC

## 2021-06-01 NOTE — Telephone Encounter (Signed)
I spoke with Liane, RN in our infusion suite. They do not have a supply of depacon.  I called patient. I advised her of this. I discussed with her the option of a nerve block but she declined. She reports that she has also tried Urgent Care/ER facilities but they are also out of depacon for migraine cocktails.  She reports that she is taking Emgality since insurance would not cover [name]. The [name] was helping her migraines but she has to try and fail Emgality for at least 3 months and she started it in October. Roselyn Meier and robaxin are not helping her migraine either.  I advised her that I would send this to Dr. Krista Blue for further recommendations. Pt verbalized understanding.

## 2021-06-01 NOTE — Telephone Encounter (Signed)
Meds ordered this encounter  Medications   Atogepant (QULIPTA) 30 MG TABS    Sig: Take 30 mg by mouth daily.    Dispense:  30 tablet    Refill:  11     

## 2021-06-01 NOTE — Addendum Note (Signed)
Addended by: Geronimo Running A on: 06/01/2021 01:14 PM   Modules accepted: Orders

## 2021-06-01 NOTE — Addendum Note (Signed)
Addended by: Marcial Pacas on: 06/01/2021 01:00 PM   Modules accepted: Orders

## 2021-06-01 NOTE — Telephone Encounter (Signed)
Pt called wanting to know even though she has been referred to another neurologist can she come in to have another infusion. Pt requesting a call back.

## 2021-06-01 NOTE — Telephone Encounter (Signed)
I called patient. She has her first appointment with [facility] neurology next month. However, she reports a migraine for the past 4 months and would like a migraine infusion at our office. She tried to get a migraine infusion in October but was told the depacon was on back order and declined the nerve block.  I will check with our Infusion Suite to find out the status of their supply of depacon. If available, I will discuss the infusion with Dr. Terrace Arabia. Pt verbalized understanding.

## 2021-06-01 NOTE — Telephone Encounter (Signed)
I called patient. I advised her that Dr. Krista Blue has prescribed qulipta for her and called this in to [facility]. I reviewed potential side effects with her.  I advised her that a PA might be needed. Pt verbalized understanding.

## 2021-06-02 ENCOUNTER — Ambulatory Visit: Payer: Self-pay | Admitting: Neurology

## 2021-06-02 NOTE — Telephone Encounter (Signed)
Received PA request for qulipta. Initiated via [place]. Waiting on clinical questions from [place]. Key: [id number].

## 2021-06-02 NOTE — Telephone Encounter (Signed)
Completed PA via CMM. Sent to [employer]. Should have a determination within 1-3 business days.  I called patient and discussed the qulipta copay card with her. She will apply for the copay card to find out if she is eligible for the $0/month copay.   Text "Enroll" to [phone] Call 1-855-QULIPTA Visit [email or web address]  Please send further calls to POD 2.

## 2021-06-03 ENCOUNTER — Telehealth: Payer: Self-pay | Admitting: *Deleted

## 2021-06-03 NOTE — Telephone Encounter (Signed)
PA started on covermymeds (key: [id number]) for Qulipta. Pt has coverage through [employer]. PA denied stating, in addition to all her past meds, she also had to try Aimovig and Nurtec. She has already tried [employer]. Never used Nurtec. I called the patient who tells me she was actually able to pick up the Qulipta today by using a co-pay card. She will go ahead and start this medication. She will no longer be followed by our office. She has been referred to the headache clinic at [facility]. She has a pending appt on 07/02/21. Her new physician will make all future medication management decisions.

## 2021-06-10 ENCOUNTER — Telehealth: Payer: Self-pay | Admitting: Neurology

## 2021-06-10 NOTE — Telephone Encounter (Signed)
Pt states she will not see the migraine specialist until next month.  She has had this current migraine for 4 months.  Pt is asking if Dr Krista Blue can call in anything for her migraine.

## 2021-06-10 NOTE — Telephone Encounter (Signed)
Left message for a return call

## 2021-06-10 NOTE — Telephone Encounter (Signed)
Left second message for a return call. 

## 2021-06-11 NOTE — Telephone Encounter (Signed)
I returned the call to the patient. Says she has kept the same headache for four months. She declined the offer for a nerve block, offered by Dr. Krista Blue. Depacon is still on a Psychologist, prison and probation services. Hx of 20+ failed medications. She just started the prescribed Qulipta one week ago. She is transferring care out of our office. She will be seen at [facility] on 07/02/21. For now, she will continue her current treatment. I also suggested she call [facility] for any earlier cancellations. She was agreeable to this plan.

## 2021-06-16 ENCOUNTER — Telehealth: Payer: Self-pay | Admitting: Neurology

## 2021-06-16 NOTE — Telephone Encounter (Signed)
No, we have exhausted our approach to her headache. She may ask her PCP for other refer

## 2021-06-16 NOTE — Telephone Encounter (Signed)
Patient saw Dr. Gala Lewandowsky at [facility] on 06/12/2021.   "We discussed that she has been to see a headache clinic and neurologist. She has tried and fail multiple oral, injections as well as BOTOX injection with no improvement. She has even tried [place] with no relief. She states that [name] did help so a sample and prescription was provided. I offered a referral to pain management but she declined.  She states that she would like to return to the neurologist that is closer to her home.  She denied having any anxiety or depression despite it being on her problems list. I feel that anxiety and depression are contributing factors to her migraine episode.  Patient to return on Return for [facility] wants to return to Dr Terrace Arabia in [place]. for follow up. Instructed patient to call with any questions or concerns."  Do you want the patient to follow up with [facility]?

## 2021-06-16 NOTE — Telephone Encounter (Signed)
I called patient. I advised her that Dr. Terrace Arabia recommends asking her PCP for another neurology referral since we have exhausted all options we can offer her to manage her headaches. Patient verbalized understanding.

## 2021-06-16 NOTE — Telephone Encounter (Signed)
Pt asked a message be sent to Marcelino Duster, RN letting her know that she did see another Neurologist who was unable to do anything for her, pt was advised to follow thru with plan of care recommended by Dr Terrace Arabia.  Pt wants to know if as a result of that does she need to make a f/u appointment with Dr Terrace Arabia.
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
## 2023-04-08 ENCOUNTER — Other Ambulatory Visit: Payer: Self-pay

## 2023-04-08 ENCOUNTER — Emergency Department (HOSPITAL_COMMUNITY): Payer: 59

## 2023-04-08 ENCOUNTER — Encounter (HOSPITAL_COMMUNITY): Payer: Self-pay

## 2023-04-08 ENCOUNTER — Emergency Department (HOSPITAL_COMMUNITY)
Admission: EM | Admit: 2023-04-08 | Discharge: 2023-04-08 | Disposition: A | Discharge status: Home / Self Care | Payer: 59 | Attending: Emergency Medicine | Admitting: Emergency Medicine

## 2023-04-08 DIAGNOSIS — R42 Dizziness and giddiness: Secondary | ICD-10-CM

## 2023-04-08 DIAGNOSIS — N189 Chronic kidney disease, unspecified: Secondary | ICD-10-CM | POA: Diagnosis not present

## 2023-04-08 DIAGNOSIS — I129 Hypertensive chronic kidney disease with stage 1 through stage 4 chronic kidney disease, or unspecified chronic kidney disease: Secondary | ICD-10-CM | POA: Diagnosis not present

## 2023-04-08 DIAGNOSIS — J189 Pneumonia, unspecified organism: Secondary | ICD-10-CM | POA: Insufficient documentation

## 2023-04-08 DIAGNOSIS — E860 Dehydration: Secondary | ICD-10-CM | POA: Insufficient documentation

## 2023-04-08 DIAGNOSIS — J984 Other disorders of lung: Secondary | ICD-10-CM

## 2023-04-08 DIAGNOSIS — Z79899 Other long term (current) drug therapy: Secondary | ICD-10-CM | POA: Insufficient documentation

## 2023-04-08 HISTORY — DX: Essential (primary) hypertension: I10

## 2023-04-08 LAB — URINALYSIS, ROUTINE W REFLEX MICROSCOPIC
Bacteria, UA: NONE SEEN
Bilirubin Urine: NEGATIVE
Glucose, UA: NEGATIVE mg/dL
Hgb urine dipstick: NEGATIVE
Ketones, ur: NEGATIVE mg/dL
Nitrite: NEGATIVE
Protein, ur: NEGATIVE mg/dL
Specific Gravity, Urine: 1.011 (ref 1.005–1.030)
pH: 5 (ref 5.0–8.0)

## 2023-04-08 LAB — CBC
HCT: 37.1 % (ref 36.0–46.0)
Hemoglobin: 11.5 g/dL — ABNORMAL LOW (ref 12.0–15.0)
MCH: 29.4 pg (ref 26.0–34.0)
MCHC: 31 g/dL (ref 30.0–36.0)
MCV: 94.9 fL (ref 80.0–100.0)
Platelets: 359 10*3/uL (ref 150–400)
RBC: 3.91 MIL/uL (ref 3.87–5.11)
RDW: 14.9 % (ref 11.5–15.5)
WBC: 6.3 10*3/uL (ref 4.0–10.5)
nRBC: 0 % (ref 0.0–0.2)

## 2023-04-08 LAB — BASIC METABOLIC PANEL
Anion gap: 12 (ref 5–15)
BUN: 25 mg/dL — ABNORMAL HIGH (ref 6–20)
CO2: 21 mmol/L — ABNORMAL LOW (ref 22–32)
Calcium: 9.4 mg/dL (ref 8.9–10.3)
Chloride: 105 mmol/L (ref 98–111)
Creatinine, Ser: 1.15 mg/dL — ABNORMAL HIGH (ref 0.44–1.00)
GFR, Estimated: 57 mL/min — ABNORMAL LOW (ref >60)
Glucose, Bld: 131 mg/dL — ABNORMAL HIGH (ref 70–99)
Potassium: 4.1 mmol/L (ref 3.5–5.1)
Sodium: 138 mmol/L (ref 135–145)

## 2023-04-08 LAB — CBG MONITORING, ED: Glucose-Capillary: 186 mg/dL — ABNORMAL HIGH (ref 70–99)

## 2023-04-08 MED ORDER — SODIUM CHLORIDE 0.9 % IV BOLUS
1000.0000 mL | Freq: Once | INTRAVENOUS | Status: AC
  Administered 2023-04-08: 1000 mL via INTRAVENOUS

## 2023-04-08 MED ORDER — IOHEXOL 350 MG/ML SOLN
75.0000 mL | Freq: Once | INTRAVENOUS | Status: AC | PRN
  Administered 2023-04-08: 75 mL via INTRAVENOUS

## 2023-04-08 NOTE — ED Notes (Signed)
Patient verbalizes understanding of discharge instructions. Opportunity for questioning and answers were provided. Armband removed by staff, pt discharged from ED. Ambulated out to lobby with family ? ?

## 2023-04-08 NOTE — ED Provider Notes (Signed)
EMERGENCY DEPARTMENT AT [facility] Provider Note   CSN: [id number] Arrival date & time: 04/08/23  1450     History {Add pertinent medical, surgical, social history, OB history to HPI:1} No chief complaint on file.   Kendra Parker is a 52 y.o. female.  52 year old female with a history of CKD, hypertension, and hyperlipidemia who presents to the emergency department with dizziness.  Patient reports that she had a left C6/C7 epidural space injection with neurosurgery on November 11.  Says that immediately afterwards started feeling very lightheaded as though she was going to pass out.  Then developed some shortness of breath and says it is persisted.  No denies any chest pain.  Has had a cough but her COVID and flu were negative.  Today her blood pressure was also low but she reports that this is new for her.  No changes in her blood pressure medication recently.       Home Medications Prior to Admission medications   Medication Sig Start Date End Date Taking? Authorizing Provider  Atogepant (QULIPTA) 30 MG TABS Take 30 mg by mouth daily. 06/01/21   Levert Feinstein, MD  atorvastatin (LIPITOR) 20 MG tablet Take 20 mg by mouth daily.    [provider]  Cholecalciferol (VITAMIN D3) 2000 UNITS TABS Take 2,000 Units by mouth daily.     [provider]  lisinopril (PRINIVIL,ZESTRIL) 10 MG tablet Take 10 mg by mouth daily.  08/22/17   [provider]  methocarbamol (ROBAXIN) 750 MG tablet Take every 8 hours to treat acute migraines. #30 allowed per month. 02/20/21   Levert Feinstein, MD  metoprolol (LOPRESSOR) 50 MG tablet Take 50 mg by mouth 2 (two) times daily.  12/14/14   [provider]  ondansetron (ZOFRAN-ODT) 4 MG disintegrating tablet DISSOLVE ONE TABLET BY MOUTH EVERY EIGHT HOURS AS NEEDED *30 DAY SUPPLY* 05/28/19   Levert Feinstein, MD  perphenazine (TRILAFON) 4 MG tablet 3 (three) times daily. 11/27/14   [provider]  QUEtiapine (SEROQUEL)  200 MG tablet Take 200 mg by mouth at bedtime.    [provider]  spironolactone (ALDACTONE) 50 MG tablet Take 50 mg by mouth. Take 0.5 tablet daily.    [provider]  Ubrogepant (UBRELVY) 50 MG TABS Take 1 tablet by mouth as needed. Take 1 tab at onset of migraine. May repeat in 2 hrs, if needed. Max dose: 2 tabs/day.This is a 30 day prescription. 07/10/20   Glean Salvo, NP  venlafaxine XR (EFFEXOR XR) 37.5 MG 24 hr capsule Take 1 capsule (37.5 mg total) by mouth daily with breakfast. 07/10/20   Glean Salvo, NP      Allergies    Lamotrigine and Penicillins    Review of Systems   Review of Systems  Physical Exam Updated Vital Signs BP (!) 99/54   Pulse 64   Temp 97.6 F (36.4 C)   Resp 14   SpO2 97%  Physical Exam Vitals and nursing note reviewed.  Constitutional:      General: She is not in acute distress.    Appearance: She is well-developed.  HENT:     Head: Normocephalic and atraumatic.     Right Ear: External ear normal.     Left Ear: External ear normal.     Nose: Nose normal.  Eyes:     Extraocular Movements: Extraocular movements intact.     Conjunctiva/sclera: Conjunctivae normal.     Pupils: Pupils are equal, round,  and reactive to light.  Cardiovascular:     Rate and Rhythm: Normal rate and regular rhythm.     Heart sounds: No murmur heard. Pulmonary:     Effort: Pulmonary effort is normal. No respiratory distress.     Breath sounds: Normal breath sounds.  Musculoskeletal:     Cervical back: Normal range of motion and neck supple.     Right lower leg: No edema.     Left lower leg: No edema.  Skin:    General: Skin is warm and dry.  Neurological:     Mental Status: She is alert and oriented to person, place, and time. Mental status is at baseline.     Comments: MENTAL STATUS: AAOx3 CRANIAL NERVES: II: Pupils equal and reactive *** mm BL, no RAPD, no VF deficits III, IV, VI: EOM intact, no gaze preference or deviation, no  nystagmus. V: normal sensation to light touch in V1, V2, and V3 segments bilaterally VII: no facial weakness or asymmetry, no nasolabial fold flattening VIII: normal hearing to speech and finger friction IX, X: normal palatal elevation, no uvular deviation XI: 5/5 head turn and 5/5 shoulder shrug bilaterally XII: midline tongue protrusion MOTOR: 5/5 strength in R shoulder flexion, elbow flexion and extension, and grip strength. 5/5 strength in L shoulder flexion, elbow flexion and extension, and grip strength.  5/5 strength in R hip and knee flexion, knee extension, ankle plantar and dorsiflexion. 5/5 strength in L hip and knee flexion, knee extension, ankle plantar and dorsiflexion. SENSORY: Normal sensation to light touch in all extremities COORD: Normal finger to nose and heel to shin, no tremor, no dysmetria   Psychiatric:        Mood and Affect: Mood normal.     ED Results / Procedures / Treatments   Labs (all labs ordered are listed, but only abnormal results are displayed) Labs Reviewed  BASIC METABOLIC PANEL - Abnormal; Notable for the following components:      Result Value   CO2 21 (*)    Glucose, Bld 131 (*)    BUN 25 (*)    Creatinine, Ser 1.15 (*)    GFR, Estimated 57 (*)    All other components within normal limits  CBC - Abnormal; Notable for the following components:   Hemoglobin 11.5 (*)    All other components within normal limits  URINALYSIS, ROUTINE W REFLEX MICROSCOPIC - Abnormal; Notable for the following components:   Leukocytes,Ua TRACE (*)    All other components within normal limits  CBG MONITORING, ED - Abnormal; Notable for the following components:   Glucose-Capillary 186 (*)    All other components within normal limits    EKG EKG Interpretation Date/Time:  Friday April 08 2023 17:33:02 EST Ventricular Rate:  68 PR Interval:  187 QRS Duration:  87 QT Interval:  410 QTC Calculation: 436 R Axis:   53  Text Interpretation: Sinus rhythm  Low voltage, precordial leads Confirmed by Vonita Moss [phone]) on 04/08/2023 8:09:53 PM  Radiology No results found.  Procedures Procedures  {Document cardiac monitor, telemetry assessment procedure when appropriate:1}  Medications Ordered in ED Medications  sodium chloride 0.9 % bolus 1,000 mL (0 mLs Intravenous Stopped 04/08/23 1749)    ED Course/ Medical Decision Making/ A&P   {   Click here for ABCD2, HEART and other calculatorsREFRESH Note before signing :1}  Medical Decision Making Amount and/or Complexity of Data Reviewed Labs: ordered. Radiology: ordered.   ***  {Document critical care time when appropriate:1} {Document review of labs and clinical decision tools ie heart score, Chads2Vasc2 etc:1}  {Document your independent review of radiology images, and any outside records:1} {Document your discussion with family members, caretakers, and with consultants:1} {Document social determinants of health affecting pt's care:1} {Document your decision making why or why not admission, treatments were needed:1} Final Clinical Impression(s) / ED Diagnoses Final diagnoses:  None    Rx / DC Orders ED Discharge Orders     None

## 2023-04-08 NOTE — ED Triage Notes (Addendum)
Pt c/o sob, dizziness, multiple syncopal episodes this week; denies pain; endorses cough; no fevers; recently tested for covid and flu, both negative; endorses hitting head; no other neuro deficits, no unilateral weakness; pt states she was evaluated at urgent care yesterday for same and was told that she need to have a CT scan done; recently had nerve block injection in neck

## 2023-04-08 NOTE — Discharge Instructions (Addendum)
You were seen in the emergency department for your dizziness and shortness of breath.  Take the antibiotics you are prescribed.  Stay well-hydrated.  Follow-up with your primary doctor in 2 to 3 days.  Return if you experience any concerning symptoms

## 2023-04-08 NOTE — ED Notes (Signed)
The pt reports that she has numerus problems but her newer problem is her back pain

## 2023-04-08 NOTE — ED Notes (Signed)
Tech tried to obtain repeat vitals. Pt. In triage w/ staff

## 2023-04-08 NOTE — ED Notes (Signed)
Pt ambulatory to bathroom

## 2023-04-08 NOTE — ED Notes (Signed)
Edp at  the bedside

## 2023-04-08 NOTE — ED Provider Triage Note (Signed)
Emergency Medicine Provider Triage Evaluation Note  Kendra Parker , a 52 y.o. female  was evaluated in triage.  Pt complains of weakness and low blood pressure  Review of Systems  Positive: syncope Negative: fever  Physical Exam  BP (!) 86/56 (BP Location: Left Arm)   Pulse 80   Temp 97.8 F (36.6 C)   Resp 16   SpO2 98%  Gen:   Awake, no distress   Resp:  Normal effort  MSK:   Moves extremities without difficulty  Other:    Medical Decision Making  Medically screening exam initiated at 3:34 PM.  Appropriate orders placed.  Kendra KNOCHE was informed that the remainder of the evaluation will be completed by another provider, this initial triage assessment does not replace that evaluation, and the importance of remaining in the ED until their evaluation is complete.     Elson Areas, [place] 04/08/23 1535

## 2023-06-06 ENCOUNTER — Other Ambulatory Visit: Payer: Self-pay | Admitting: Neurosurgery

## 2023-06-06 DIAGNOSIS — M25512 Pain in left shoulder: Secondary | ICD-10-CM

## 2023-06-27 ENCOUNTER — Other Ambulatory Visit: Payer: 59

## 2023-07-11 ENCOUNTER — Ambulatory Visit
Admission: RE | Admit: 2023-07-11 | Discharge: 2023-07-11 | Disposition: A | Discharge status: Home / Self Care | Payer: 59 | Source: Ambulatory Visit | Attending: Neurosurgery | Admitting: Neurosurgery

## 2023-07-11 DIAGNOSIS — M25512 Pain in left shoulder: Secondary | ICD-10-CM

## 2023-09-26 ENCOUNTER — Encounter (HOSPITAL_BASED_OUTPATIENT_CLINIC_OR_DEPARTMENT_OTHER): Payer: Self-pay

## 2023-09-26 ENCOUNTER — Emergency Department (HOSPITAL_BASED_OUTPATIENT_CLINIC_OR_DEPARTMENT_OTHER)
Admission: EM | Admit: 2023-09-26 | Discharge: 2023-09-26 | Disposition: A | Discharge status: Home / Self Care | Source: Home / Self Care | Attending: Emergency Medicine | Admitting: Emergency Medicine

## 2023-09-26 ENCOUNTER — Other Ambulatory Visit: Payer: Self-pay

## 2023-09-26 ENCOUNTER — Emergency Department (HOSPITAL_BASED_OUTPATIENT_CLINIC_OR_DEPARTMENT_OTHER)

## 2023-09-26 DIAGNOSIS — K5900 Constipation, unspecified: Secondary | ICD-10-CM | POA: Insufficient documentation

## 2023-09-26 DIAGNOSIS — Z79899 Other long term (current) drug therapy: Secondary | ICD-10-CM | POA: Insufficient documentation

## 2023-09-26 DIAGNOSIS — K859 Acute pancreatitis without necrosis or infection, unspecified: Secondary | ICD-10-CM | POA: Insufficient documentation

## 2023-09-26 DIAGNOSIS — K853 Drug induced acute pancreatitis without necrosis or infection: Secondary | ICD-10-CM | POA: Diagnosis not present

## 2023-09-26 DIAGNOSIS — R109 Unspecified abdominal pain: Secondary | ICD-10-CM | POA: Diagnosis not present

## 2023-09-26 LAB — COMPREHENSIVE METABOLIC PANEL WITH GFR
ALT: 27 U/L (ref 0–44)
AST: 52 U/L — ABNORMAL HIGH (ref 15–41)
Albumin: 3.7 g/dL (ref 3.5–5.0)
Alkaline Phosphatase: 164 U/L — ABNORMAL HIGH (ref 38–126)
Anion gap: 21 — ABNORMAL HIGH (ref 5–15)
BUN: 20 mg/dL (ref 6–20)
CO2: 19 mmol/L — ABNORMAL LOW (ref 22–32)
Calcium: 9.9 mg/dL (ref 8.9–10.3)
Chloride: 91 mmol/L — ABNORMAL LOW (ref 98–111)
Creatinine, Ser: 0.7 mg/dL (ref 0.44–1.00)
GFR, Estimated: >60 mL/min (ref >60)
Glucose, Bld: 103 mg/dL — ABNORMAL HIGH (ref 70–99)
Potassium: 4 mmol/L (ref 3.5–5.1)
Sodium: 132 mmol/L — ABNORMAL LOW (ref 135–145)
Total Bilirubin: 0.2 mg/dL (ref 0.0–1.2)
Total Protein: 7.2 g/dL (ref 6.5–8.1)

## 2023-09-26 LAB — CBC
HCT: 33.8 % — ABNORMAL LOW (ref 36.0–46.0)
Hemoglobin: 11.5 g/dL — ABNORMAL LOW (ref 12.0–15.0)
MCH: 31.2 pg (ref 26.0–34.0)
MCHC: 34 g/dL (ref 30.0–36.0)
MCV: 91.6 fL (ref 80.0–100.0)
Platelets: 380 10*3/uL (ref 150–400)
RBC: 3.69 MIL/uL — ABNORMAL LOW (ref 3.87–5.11)
RDW: 13.4 % (ref 11.5–15.5)
WBC: 7.4 10*3/uL (ref 4.0–10.5)
nRBC: 0.3 % — ABNORMAL HIGH (ref 0.0–0.2)

## 2023-09-26 LAB — URINALYSIS, ROUTINE W REFLEX MICROSCOPIC
Bilirubin Urine: NEGATIVE
Glucose, UA: NEGATIVE mg/dL
Hgb urine dipstick: NEGATIVE
Leukocytes,Ua: NEGATIVE
Nitrite: NEGATIVE
Protein, ur: NEGATIVE mg/dL
Specific Gravity, Urine: 1.032 — ABNORMAL HIGH (ref 1.005–1.030)
pH: 6.5 (ref 5.0–8.0)

## 2023-09-26 LAB — LIPASE, BLOOD: Lipase: 149 U/L — ABNORMAL HIGH (ref 11–51)

## 2023-09-26 MED ORDER — FLEET ENEMA RE ENEM
1.0000 | ENEMA | Freq: Once | RECTAL | Status: AC
  Administered 2023-09-26: 1 via RECTAL
  Filled 2023-09-26: qty 1

## 2023-09-26 MED ORDER — GOLYTELY 236 G PO SOLR: 4000.0000 mL | Freq: Once | ORAL | 0 refills | Status: AC

## 2023-09-26 MED ORDER — ONDANSETRON HCL 4 MG/2ML IJ SOLN: 4.0000 mg | Freq: Once | INTRAMUSCULAR | Status: DC | PRN

## 2023-09-26 MED ORDER — KETOROLAC TROMETHAMINE 15 MG/ML IJ SOLN
15.0000 mg | Freq: Once | INTRAMUSCULAR | Status: AC
  Administered 2023-09-26: 15 mg via INTRAVENOUS
  Filled 2023-09-26: qty 1

## 2023-09-26 MED ORDER — PEG 3350-KCL-NA BICARB-NACL 420 G PO SOLR: 4000.0000 mL | Freq: Once | ORAL | Status: DC

## 2023-09-26 MED ORDER — HYDROMORPHONE HCL 1 MG/ML IJ SOLN
1.0000 mg | Freq: Once | INTRAMUSCULAR | Status: AC
  Administered 2023-09-26: 1 mg via INTRAVENOUS
  Filled 2023-09-26: qty 1

## 2023-09-26 MED ORDER — ONDANSETRON 4 MG PO TBDP: 4.0000 mg | ORAL_TABLET | Freq: Three times a day (TID) | ORAL | 0 refills | Status: DC | PRN

## 2023-09-26 MED ORDER — IOHEXOL 350 MG/ML SOLN
100.0000 mL | Freq: Once | INTRAVENOUS | Status: AC | PRN
  Administered 2023-09-26: 100 mL via INTRAVENOUS

## 2023-09-26 MED ORDER — DIAZEPAM 2 MG PO TABS
2.0000 mg | ORAL_TABLET | Freq: Once | ORAL | Status: AC
  Administered 2023-09-26: 2 mg via ORAL
  Filled 2023-09-26: qty 1

## 2023-09-26 MED ORDER — SODIUM CHLORIDE 0.9 % IV BOLUS
1000.0000 mL | Freq: Once | INTRAVENOUS | Status: AC
  Administered 2023-09-26: 1000 mL via INTRAVENOUS

## 2023-09-26 MED ORDER — MORPHINE SULFATE (PF) 4 MG/ML IV SOLN
4.0000 mg | Freq: Once | INTRAVENOUS | Status: AC
  Administered 2023-09-26: 4 mg via INTRAVENOUS
  Filled 2023-09-26: qty 1

## 2023-09-26 NOTE — ED Triage Notes (Signed)
 Pt POV from PCP office c/o abd pain w/ NVD and SOB. Pt c/o constipation x 1 week. PCP advised pt to come to ED for IV rehydration and cardiac workup due to tachycardia noted during her visit

## 2023-09-26 NOTE — ED Notes (Addendum)
 Patient had small BM after soap suds. PA informed

## 2023-09-26 NOTE — ED Notes (Signed)
 ED Provider at bedside.

## 2023-09-26 NOTE — Discharge Instructions (Addendum)
 Pancreatitis this is likely the cause of your pain, make sure you are drinking lots of clear fluids, and take your home pain medication for this.  Additionally return if you have intractable nausea, or vomiting.  Additionally you are constipated, I have prescribed you a bowel prep, you should drink the 4000 mL, over the span of 8 to 10 hours.  And should have a good bowel movement.  You can also add Senokot, into your routine, and MiraLAX daily to help prevent constipation.

## 2023-09-26 NOTE — ED Provider Notes (Signed)
 [place] EMERGENCY DEPARTMENT AT [facility] Provider Note   CSN: [id number] Arrival date & time: 09/26/23  1712     History  Chief Complaint  Patient presents with   Abdominal Pain    Kendra Parker is a 53 y.o. female, history of abdominal hysterectomy, and ovarian cystectomy, who presents to the ED secondary to abdominal discomfort, nausea, vomiting, and constipation, as well as shortness of breath for the last 2 weeks.  She states for the last couple weeks, she has had no bowel movement, cannot pass gas, and is having multiple episodes of vomiting daily.  She did not eat or drink, she has been nauseated.  She also reports that she has some shortness of breath, denies any chest pain.  Denies any injuries, recent surgeries.  Notes that she has tried 4 days of regular MiraLAX without relief.  Notes that she has diffuse abdominal pain.   Home Medications Prior to Admission medications   Medication Sig Start Date End Date Taking? Authorizing Provider  ondansetron  (ZOFRAN -ODT) 4 MG disintegrating tablet Take 1 tablet (4 mg total) by mouth every 8 (eight) hours as needed. 09/26/23  Yes Arelia Volpe L, PA  polyethylene glycol (GOLYTELY ) 236 g solution Take 4,000 mLs by mouth once for 1 dose. 09/26/23 09/26/23 Yes Shatora Weatherbee L, PA  Atogepant  (QULIPTA ) 30 MG TABS Take 30 mg by mouth daily. 06/01/21   Phebe Brasil, MD  atorvastatin  (LIPITOR) 20 MG tablet Take 20 mg by mouth daily.    [provider]  Cholecalciferol (VITAMIN D3) 2000 UNITS TABS Take 2,000 Units by mouth daily.     [provider]  lisinopril (PRINIVIL,ZESTRIL) 10 MG tablet Take 10 mg by mouth daily.  08/22/17   [provider]  methocarbamol  (ROBAXIN ) 750 MG tablet Take every 8 hours to treat acute migraines. #30 allowed per month. 02/20/21   Phebe Brasil, MD  metoprolol  (LOPRESSOR ) 50 MG tablet Take 50 mg by mouth 2 (two) times daily.  12/14/14   [provider]  perphenazine (TRILAFON) 4 MG  tablet 3 (three) times daily. 11/27/14   [provider]  QUEtiapine  (SEROQUEL ) 200 MG tablet Take 200 mg by mouth at bedtime.    [provider]  spironolactone (ALDACTONE) 50 MG tablet Take 50 mg by mouth. Take 0.5 tablet daily.    [provider]  Ubrogepant  (UBRELVY ) 50 MG TABS Take 1 tablet by mouth as needed. Take 1 tab at onset of migraine. May repeat in 2 hrs, if needed. Max dose: 2 tabs/day.This is a 30 day prescription. 07/10/20   Wess Hammed, NP  venlafaxine  XR (EFFEXOR  XR) 37.5 MG 24 hr capsule Take 1 capsule (37.5 mg total) by mouth daily with breakfast. 07/10/20   Wess Hammed, NP      Allergies    Lamotrigine and Penicillins    Review of Systems   Review of Systems  Constitutional:  Negative for fever.  Gastrointestinal:  Positive for abdominal pain, nausea and vomiting.    Physical Exam Updated Vital Signs BP 138/88   Pulse (!) 116   Temp 98.2 F (36.8 C)   Resp (!) 22   Ht 5\' 3"  (1.6 m)   Wt 110.2 kg   SpO2 97%   BMI 43.05 kg/m  Physical Exam Vitals and nursing note reviewed.  Constitutional:      General: She is not in acute distress.    Appearance: She is well-developed.  HENT:     Head: Normocephalic and  atraumatic.  Eyes:     Conjunctiva/sclera: Conjunctivae normal.  Cardiovascular:     Rate and Rhythm: Normal rate and regular rhythm.     Heart sounds: No murmur heard. Pulmonary:     Effort: Pulmonary effort is normal. No respiratory distress.     Breath sounds: Normal breath sounds.  Abdominal:     General: There is distension.     Palpations: Abdomen is soft.     Tenderness: There is generalized abdominal tenderness.  Musculoskeletal:        General: No swelling.     Cervical back: Neck supple.  Skin:    General: Skin is warm and dry.     Capillary Refill: Capillary refill takes less than 2 seconds.  Neurological:     Mental Status: She is alert.  Psychiatric:        Mood and Affect: Mood normal.     ED  Results / Procedures / Treatments   Labs (all labs ordered are listed, but only abnormal results are displayed) Labs Reviewed  LIPASE, BLOOD - Abnormal; Notable for the following components:      Result Value   Lipase 149 (*)    All other components within normal limits  COMPREHENSIVE METABOLIC PANEL WITH GFR - Abnormal; Notable for the following components:   Sodium 132 (*)    Chloride 91 (*)    CO2 19 (*)    Glucose, Bld 103 (*)    AST 52 (*)    Alkaline Phosphatase 164 (*)    Anion gap 21 (*)    All other components within normal limits  CBC - Abnormal; Notable for the following components:   RBC 3.69 (*)    Hemoglobin 11.5 (*)    HCT 33.8 (*)    nRBC 0.3 (*)    All other components within normal limits  URINALYSIS, ROUTINE W REFLEX MICROSCOPIC - Abnormal; Notable for the following components:   Specific Gravity, Urine 1.032 (*)    Ketones, ur TRACE (*)    All other components within normal limits    EKG EKG Interpretation Date/Time:  Monday Sep 26 2023 17:19:49 EDT Ventricular Rate:  142 PR Interval:  136 QRS Duration:  80 QT Interval:  264 QTC Calculation: 406 R Axis:   83  Text Interpretation: new Sinus tachycardia T wave abnormality, consider inferior ischemia When compared with ECG of 08-Apr-2023 17:33, PREVIOUS ECG IS PRESENT Confirmed by Almond Army ([id number]) on 09/26/2023 5:39:58 PM  Radiology US  Abdomen Limited RUQ (LIVER/GB) Result Date: 09/26/2023 CLINICAL DATA:  [id number] Pancreatitis [id number] Transaminitis [id number] EXAM: ULTRASOUND ABDOMEN LIMITED RIGHT UPPER QUADRANT COMPARISON:  None Available. FINDINGS: Gallbladder: No gallstones, pericholecystic fluid or wall thickening visualized. Common bile duct: Diameter: 0.4 cm. Liver: Liver is hyperechoic consistent with fatty infiltration. No focal hepatic lesions identified. No intrahepatic ductal dilatation. Hepatopetal portal vein flow. IMPRESSION: Hepatic fatty infiltration. Otherwise unremarkable  examination of the liver and gallbladder. Electronically Signed   By: Sydell Eva M.D.   On: 09/26/2023 21:06   CT ABDOMEN PELVIS W CONTRAST Result Date: 09/26/2023 CLINICAL DATA:  Tachycardia, shortness of breath, abdominal pain, nausea, vomiting, and diarrhea EXAM: CT ANGIOGRAPHY CHEST CT ABDOMEN AND PELVIS WITH CONTRAST TECHNIQUE: Multidetector CT imaging of the chest was performed using the standard protocol during bolus administration of intravenous contrast. Multiplanar CT image reconstructions and MIPs were obtained to evaluate the vascular anatomy. Multidetector CT imaging of the abdomen and pelvis was performed using the standard protocol during bolus administration of  intravenous contrast. RADIATION DOSE REDUCTION: This exam was performed according to the departmental dose-optimization program which includes automated exposure control, adjustment of the mA and/or kV according to patient size and/or use of iterative reconstruction technique. CONTRAST:  OMNIPAQUE  IOHEXOL  350 MG/ML SOLN COMPARISON:  CTA chest dated 04/08/2023, CT abdomen and pelvis dated 11/28/2020 FINDINGS: CTA CHEST FINDINGS Cardiovascular: The study is adequate for the evaluation of pulmonary embolism to the level of the distal segmental pulmonary arteries in the setting of motion artifact and somewhat suboptimal contrast bolus timing. No definite filling defect to the level of the distal segmental pulmonary arteries. Apparent focal expansion and hypodensity of the left lower lobar posterior segmental artery (5:125) without correlate on subsequent portal venous phase image (11:3), likely artifactual. Great vessels are normal in course and caliber. Normal heart size. No significant pericardial fluid/thickening. Mediastinum/Nodes: Imaged thyroid gland without nodules meeting criteria for imaging follow-up by size. Normal esophagus. No pathologically enlarged axillary, supraclavicular, mediastinal, or hilar lymph nodes.  Lungs/Pleura: The central airways are patent. Diffuse mosaic attenuation. Bibasilar linear subsegmental atelectasis. No pneumothorax. No pleural effusion. Musculoskeletal: No acute or abnormal lytic or blastic osseous lesions. Multilevel degenerative changes of the thoracic spine. Review of the MIP images confirms the above findings. CT ABDOMEN and PELVIS FINDINGS Hepatobiliary: No focal hepatic lesions. No intra or extrahepatic biliary ductal dilation. Normal gallbladder. Pancreas: Diffusely edematous appearance of the pancreas with surrounding peripancreatic stranding and free fluid. No main pancreatic ductal dilation. Spleen: Normal in size without focal abnormality. Adrenals/Urinary Tract: No adrenal nodules. No suspicious renal mass, calculi, or hydronephrosis. Mild mural thickening of the right mid ureter, likely reactive. No focal bladder wall thickening. Stomach/Bowel: Normal appearance of the stomach. No evidence of bowel wall thickening, distention, or inflammatory changes. Moderate volume mildly hyperattenuating, desiccated stool throughout the colon. Normal appendix. Vascular/Lymphatic: Aortic atherosclerosis. No enlarged abdominal or pelvic lymph nodes. Reproductive: No adnexal masses. Other: Claude Swendsen volume free fluid extending from the level of pancreas along the anterior renal fascia into the pelvis. No free air or fluid collection. Mildly nodular appearance of the omentum, likely related to mesenteric edema. Musculoskeletal: No acute or abnormal lytic or blastic osseous lesions. Mild subcutaneous soft tissue edema along the right anterior abdomen. IMPRESSION: 1. No definite pulmonary embolism to the level of the distal segmental pulmonary arteries in the setting of motion artifact and somewhat suboptimal contrast bolus timing. 2. Acute interstitial edematous pancreatitis. No evidence of pancreatic necrosis or fluid collection. 3. Diffuse mosaic attenuation, which can be seen in the setting of Syriana Croslin  airways disease. 4. Moderate volume mildly hyperattenuating, desiccated stool throughout the colon, which can be seen in the setting of constipation. 5.  Aortic Atherosclerosis (ICD10-I70.0). Electronically Signed   By: Limin  Xu M.D.   On: 09/26/2023 19:30   CT Angio Chest PE W and/or Wo Contrast Result Date: 09/26/2023 CLINICAL DATA:  Tachycardia, shortness of breath, abdominal pain, nausea, vomiting, and diarrhea EXAM: CT ANGIOGRAPHY CHEST CT ABDOMEN AND PELVIS WITH CONTRAST TECHNIQUE: Multidetector CT imaging of the chest was performed using the standard protocol during bolus administration of intravenous contrast. Multiplanar CT image reconstructions and MIPs were obtained to evaluate the vascular anatomy. Multidetector CT imaging of the abdomen and pelvis was performed using the standard protocol during bolus administration of intravenous contrast. RADIATION DOSE REDUCTION: This exam was performed according to the departmental dose-optimization program which includes automated exposure control, adjustment of the mA and/or kV according to patient size and/or use of iterative reconstruction technique.  CONTRAST:  OMNIPAQUE  IOHEXOL  350 MG/ML SOLN COMPARISON:  CTA chest dated 04/08/2023, CT abdomen and pelvis dated 11/28/2020 FINDINGS: CTA CHEST FINDINGS Cardiovascular: The study is adequate for the evaluation of pulmonary embolism to the level of the distal segmental pulmonary arteries in the setting of motion artifact and somewhat suboptimal contrast bolus timing. No definite filling defect to the level of the distal segmental pulmonary arteries. Apparent focal expansion and hypodensity of the left lower lobar posterior segmental artery (5:125) without correlate on subsequent portal venous phase image (11:3), likely artifactual. Great vessels are normal in course and caliber. Normal heart size. No significant pericardial fluid/thickening. Mediastinum/Nodes: Imaged thyroid gland without nodules meeting  criteria for imaging follow-up by size. Normal esophagus. No pathologically enlarged axillary, supraclavicular, mediastinal, or hilar lymph nodes. Lungs/Pleura: The central airways are patent. Diffuse mosaic attenuation. Bibasilar linear subsegmental atelectasis. No pneumothorax. No pleural effusion. Musculoskeletal: No acute or abnormal lytic or blastic osseous lesions. Multilevel degenerative changes of the thoracic spine. Review of the MIP images confirms the above findings. CT ABDOMEN and PELVIS FINDINGS Hepatobiliary: No focal hepatic lesions. No intra or extrahepatic biliary ductal dilation. Normal gallbladder. Pancreas: Diffusely edematous appearance of the pancreas with surrounding peripancreatic stranding and free fluid. No main pancreatic ductal dilation. Spleen: Normal in size without focal abnormality. Adrenals/Urinary Tract: No adrenal nodules. No suspicious renal mass, calculi, or hydronephrosis. Mild mural thickening of the right mid ureter, likely reactive. No focal bladder wall thickening. Stomach/Bowel: Normal appearance of the stomach. No evidence of bowel wall thickening, distention, or inflammatory changes. Moderate volume mildly hyperattenuating, desiccated stool throughout the colon. Normal appendix. Vascular/Lymphatic: Aortic atherosclerosis. No enlarged abdominal or pelvic lymph nodes. Reproductive: No adnexal masses. Other: Daveon Arpino volume free fluid extending from the level of pancreas along the anterior renal fascia into the pelvis. No free air or fluid collection. Mildly nodular appearance of the omentum, likely related to mesenteric edema. Musculoskeletal: No acute or abnormal lytic or blastic osseous lesions. Mild subcutaneous soft tissue edema along the right anterior abdomen. IMPRESSION: 1. No definite pulmonary embolism to the level of the distal segmental pulmonary arteries in the setting of motion artifact and somewhat suboptimal contrast bolus timing. 2. Acute interstitial edematous  pancreatitis. No evidence of pancreatic necrosis or fluid collection. 3. Diffuse mosaic attenuation, which can be seen in the setting of Jordi Lacko airways disease. 4. Moderate volume mildly hyperattenuating, desiccated stool throughout the colon, which can be seen in the setting of constipation. 5.  Aortic Atherosclerosis (ICD10-I70.0). Electronically Signed   By: Limin  Xu M.D.   On: 09/26/2023 19:30    Procedures Procedures    Medications Ordered in ED Medications  ondansetron  (ZOFRAN ) injection 4 mg (has no administration in time range)  polyethylene glycol-electrolytes (NuLYTELY) solution 4,000 mL ( Oral Canceled Entry 09/26/23 2309)  iohexol  (OMNIPAQUE ) 350 MG/ML injection 100 mL (100 mLs Intravenous Contrast Given 09/26/23 1840)  sodium chloride  0.9 % bolus 1,000 mL (0 mLs Intravenous Stopped 09/26/23 2011)  ketorolac  (TORADOL ) 15 MG/ML injection 15 mg (15 mg Intravenous Given 09/26/23 1910)  sodium phosphate (FLEET) enema 1 enema (1 enema Rectal Given 09/26/23 2011)  sodium chloride  0.9 % bolus 1,000 mL (0 mLs Intravenous Stopped 09/26/23 2107)  morphine  (PF) 4 MG/ML injection 4 mg (4 mg Intravenous Given 09/26/23 2153)  diazepam  (VALIUM ) tablet 2 mg (2 mg Oral Given 09/26/23 2218)  HYDROmorphone  (DILAUDID ) injection 1 mg (1 mg Intravenous Given 09/26/23 2234)    ED Course/ Medical Decision Making/ A&P  Medical Decision Making Patient is a 53 year old female, history of depression, here for abdominal pain, has been going on for the last 2 weeks, she has intractable nausea, vomiting, vomiting twice a day, and just feels very sick to her stomach.  She denies any alcohol use.  Has not been having any bowel movements, for the last 2 weeks either.  We will obtain a CT abdomen pelvis, as well as CTA PE, given that she has a little bit of shortness of breath, and she is tachycardic in the 140s.  EKG shows sinus tach.  She has no chest pain.  Will also obtain CT on pelvis, for  diffuse abdominal pain, and for possible bowel obstruction.  Will hold off on narcotics at this time, given concern for possible severe constipation, which may impede, passage of the stool  Amount and/or Complexity of Data Reviewed Labs: ordered.    Details: Lipase elevated at 149, alk phos elevated at 164, AST mildly elevated at 52 Radiology: ordered.    Details: CT on pelvis shows moderate amount of constipation, as well as edematous pancreatitis, right upper quadrant ultrasound shows fatty liver, but no evidence of any kind of gallbladder obstruction, or biliary duct dilation.  CTA shows no evidence of any kind of PE Discussion of management or test interpretation with external provider(s): I tried multiple enemas, for relief of constipation was ever able to have bowel movement, but still feels constipated, given great degree of pain, narcotics were utilized, to help with the pain control, and she had success after getting morphine , and Dilaudid .  She is asking to go home, and I discussed that she will need to use her pain medication, at home, for this as she is under a pain contract.  I sent her Zofran , as well as GoLytely , for her severe constipation, and instructed her on return precautions.  The entire time she had very pressured speech and appeared very anxious, thus I think that is where the tachycardia may have been becoming from.  She has no evidence of PE, and shortness of breath, has improved, since pain has been controlled.  Return precautions emphasized, and discharged home  Risk OTC drugs. Prescription drug management.   Final Clinical Impression(s) / ED Diagnoses Final diagnoses:  Acute pancreatitis without infection or necrosis, unspecified pancreatitis type  Constipation, unspecified constipation type    Rx / DC Orders ED Discharge Orders          Ordered    polyethylene glycol (GOLYTELY ) 236 g solution   Once        09/26/23 2308    ondansetron  (ZOFRAN -ODT) 4 MG  disintegrating tablet  Every 8 hours PRN        09/26/23 2308              Mery Guadalupe, Dwaine Gip, PA 09/26/23 2316    Almond Army, MD 09/30/23 1334

## 2023-09-26 NOTE — ED Notes (Signed)
 PA Small aware of patient's heart rate. Ok'd to discharge home

## 2023-09-27 ENCOUNTER — Other Ambulatory Visit: Payer: Self-pay

## 2023-09-27 ENCOUNTER — Encounter (HOSPITAL_COMMUNITY): Payer: Self-pay | Admitting: Emergency Medicine

## 2023-09-27 ENCOUNTER — Inpatient Hospital Stay (HOSPITAL_COMMUNITY)
Admission: EM | Admit: 2023-09-27 | Discharge: 2023-10-22 | DRG: 439 | Disposition: A | Discharge status: Home / Self Care | Attending: Internal Medicine | Admitting: Internal Medicine

## 2023-09-27 ENCOUNTER — Observation Stay (HOSPITAL_COMMUNITY)

## 2023-09-27 DIAGNOSIS — K567 Ileus, unspecified: Secondary | ICD-10-CM | POA: Diagnosis not present

## 2023-09-27 DIAGNOSIS — Z79899 Other long term (current) drug therapy: Secondary | ICD-10-CM

## 2023-09-27 DIAGNOSIS — E876 Hypokalemia: Secondary | ICD-10-CM | POA: Diagnosis not present

## 2023-09-27 DIAGNOSIS — E039 Hypothyroidism, unspecified: Secondary | ICD-10-CM

## 2023-09-27 DIAGNOSIS — Z807 Family history of other malignant neoplasms of lymphoid, hematopoietic and related tissues: Secondary | ICD-10-CM

## 2023-09-27 DIAGNOSIS — F17290 Nicotine dependence, other tobacco product, uncomplicated: Secondary | ICD-10-CM | POA: Diagnosis present

## 2023-09-27 DIAGNOSIS — Z803 Family history of malignant neoplasm of breast: Secondary | ICD-10-CM

## 2023-09-27 DIAGNOSIS — G8929 Other chronic pain: Secondary | ICD-10-CM

## 2023-09-27 DIAGNOSIS — G894 Chronic pain syndrome: Secondary | ICD-10-CM | POA: Diagnosis present

## 2023-09-27 DIAGNOSIS — T398X5A Adverse effect of other nonopioid analgesics and antipyretics, not elsewhere classified, initial encounter: Secondary | ICD-10-CM | POA: Diagnosis present

## 2023-09-27 DIAGNOSIS — Z888 Allergy status to other drugs, medicaments and biological substances status: Secondary | ICD-10-CM

## 2023-09-27 DIAGNOSIS — R0600 Dyspnea, unspecified: Secondary | ICD-10-CM | POA: Diagnosis not present

## 2023-09-27 DIAGNOSIS — K859 Acute pancreatitis without necrosis or infection, unspecified: Secondary | ICD-10-CM | POA: Diagnosis not present

## 2023-09-27 DIAGNOSIS — G4700 Insomnia, unspecified: Secondary | ICD-10-CM

## 2023-09-27 DIAGNOSIS — Z800 Family history of malignant neoplasm of digestive organs: Secondary | ICD-10-CM

## 2023-09-27 DIAGNOSIS — G43E09 Chronic migraine with aura, not intractable, without status migrainosus: Secondary | ICD-10-CM

## 2023-09-27 DIAGNOSIS — R110 Nausea: Secondary | ICD-10-CM

## 2023-09-27 DIAGNOSIS — F32A Depression, unspecified: Secondary | ICD-10-CM | POA: Diagnosis present

## 2023-09-27 DIAGNOSIS — Z560 Unemployment, unspecified: Secondary | ICD-10-CM

## 2023-09-27 DIAGNOSIS — I10 Essential (primary) hypertension: Secondary | ICD-10-CM

## 2023-09-27 DIAGNOSIS — K5900 Constipation, unspecified: Secondary | ICD-10-CM

## 2023-09-27 DIAGNOSIS — R569 Unspecified convulsions: Secondary | ICD-10-CM | POA: Diagnosis not present

## 2023-09-27 DIAGNOSIS — B379 Candidiasis, unspecified: Secondary | ICD-10-CM | POA: Diagnosis not present

## 2023-09-27 DIAGNOSIS — Z90710 Acquired absence of both cervix and uterus: Secondary | ICD-10-CM

## 2023-09-27 DIAGNOSIS — K828 Other specified diseases of gallbladder: Secondary | ICD-10-CM

## 2023-09-27 DIAGNOSIS — Z79891 Long term (current) use of opiate analgesic: Secondary | ICD-10-CM

## 2023-09-27 DIAGNOSIS — R000 Tachycardia, unspecified: Secondary | ICD-10-CM

## 2023-09-27 DIAGNOSIS — K853 Drug induced acute pancreatitis without necrosis or infection: Principal | ICD-10-CM | POA: Diagnosis present

## 2023-09-27 DIAGNOSIS — E66813 Obesity, class 3: Secondary | ICD-10-CM

## 2023-09-27 DIAGNOSIS — E785 Hyperlipidemia, unspecified: Secondary | ICD-10-CM

## 2023-09-27 DIAGNOSIS — G43909 Migraine, unspecified, not intractable, without status migrainosus: Secondary | ICD-10-CM | POA: Diagnosis present

## 2023-09-27 DIAGNOSIS — Z79890 Hormone replacement therapy (postmenopausal): Secondary | ICD-10-CM

## 2023-09-27 DIAGNOSIS — Z825 Family history of asthma and other chronic lower respiratory diseases: Secondary | ICD-10-CM

## 2023-09-27 DIAGNOSIS — R1013 Epigastric pain: Secondary | ICD-10-CM

## 2023-09-27 DIAGNOSIS — R7302 Impaired glucose tolerance (oral): Secondary | ICD-10-CM | POA: Diagnosis not present

## 2023-09-27 DIAGNOSIS — Z6841 Body Mass Index (BMI) 40.0 and over, adult: Secondary | ICD-10-CM

## 2023-09-27 DIAGNOSIS — Z880 Allergy status to penicillin: Secondary | ICD-10-CM

## 2023-09-27 LAB — COMPREHENSIVE METABOLIC PANEL WITH GFR
ALT: 30 U/L (ref 0–44)
AST: 51 U/L — ABNORMAL HIGH (ref 15–41)
Albumin: 2.6 g/dL — ABNORMAL LOW (ref 3.5–5.0)
Alkaline Phosphatase: 125 U/L (ref 38–126)
Anion gap: 15 (ref 5–15)
BUN: 7 mg/dL (ref 6–20)
CO2: 20 mmol/L — ABNORMAL LOW (ref 22–32)
Calcium: 8.9 mg/dL (ref 8.9–10.3)
Chloride: 97 mmol/L — ABNORMAL LOW (ref 98–111)
Creatinine, Ser: 0.59 mg/dL (ref 0.44–1.00)
GFR, Estimated: >60 mL/min (ref >60)
Glucose, Bld: 119 mg/dL — ABNORMAL HIGH (ref 70–99)
Potassium: 3.8 mmol/L (ref 3.5–5.1)
Sodium: 132 mmol/L — ABNORMAL LOW (ref 135–145)
Total Bilirubin: 0.5 mg/dL (ref 0.0–1.2)
Total Protein: 6.7 g/dL (ref 6.5–8.1)

## 2023-09-27 LAB — CBC
HCT: 33.2 % — ABNORMAL LOW (ref 36.0–46.0)
Hemoglobin: 10.9 g/dL — ABNORMAL LOW (ref 12.0–15.0)
MCH: 31.1 pg (ref 26.0–34.0)
MCHC: 32.8 g/dL (ref 30.0–36.0)
MCV: 94.6 fL (ref 80.0–100.0)
Platelets: 372 10*3/uL (ref 150–400)
RBC: 3.51 MIL/uL — ABNORMAL LOW (ref 3.87–5.11)
RDW: 14.2 % (ref 11.5–15.5)
WBC: 9.9 10*3/uL (ref 4.0–10.5)
nRBC: 0 % (ref 0.0–0.2)

## 2023-09-27 LAB — LIPASE, BLOOD: Lipase: 98 U/L — ABNORMAL HIGH (ref 11–51)

## 2023-09-27 LAB — LACTATE DEHYDROGENASE: LDH: 295 U/L — ABNORMAL HIGH (ref 98–192)

## 2023-09-27 LAB — TSH: TSH: 1.692 u[IU]/mL (ref 0.350–4.500)

## 2023-09-27 LAB — HIV ANTIBODY (ROUTINE TESTING W REFLEX): HIV Screen 4th Generation wRfx: NONREACTIVE

## 2023-09-27 LAB — TRIGLYCERIDES: Triglycerides: 77 mg/dL (ref <150)

## 2023-09-27 MED ORDER — SMOG ENEMA
400.0000 mL | Freq: Once | RECTAL | Status: AC
  Administered 2023-09-27: 400 mL via RECTAL
  Filled 2023-09-27: qty 960

## 2023-09-27 MED ORDER — ALBUTEROL SULFATE (2.5 MG/3ML) 0.083% IN NEBU: 2.5000 mg | INHALATION_SOLUTION | Freq: Four times a day (QID) | RESPIRATORY_TRACT | Status: DC | PRN

## 2023-09-27 MED ORDER — DULOXETINE HCL 30 MG PO CPEP
30.0000 mg | ORAL_CAPSULE | Freq: Every day | ORAL | Status: DC
  Administered 2023-09-27 – 2023-10-21 (×25): 30 mg via ORAL
  Filled 2023-09-27 (×25): qty 1

## 2023-09-27 MED ORDER — LEVOTHYROXINE SODIUM 25 MCG PO TABS
25.0000 ug | ORAL_TABLET | Freq: Every day | ORAL | Status: DC
  Administered 2023-09-27 – 2023-10-22 (×26): 25 ug via ORAL
  Filled 2023-09-27 (×26): qty 1

## 2023-09-27 MED ORDER — SUMATRIPTAN SUCCINATE 100 MG PO TABS
100.0000 mg | ORAL_TABLET | Freq: Every day | ORAL | Status: DC
  Administered 2023-09-27 – 2023-09-29 (×3): 100 mg via ORAL
  Filled 2023-09-27 (×3): qty 1

## 2023-09-27 MED ORDER — SODIUM CHLORIDE 0.9% FLUSH
3.0000 mL | Freq: Two times a day (BID) | INTRAVENOUS | Status: DC
  Administered 2023-09-27 – 2023-10-21 (×45): 3 mL via INTRAVENOUS

## 2023-09-27 MED ORDER — DIPHENHYDRAMINE HCL 25 MG PO CAPS
25.0000 mg | ORAL_CAPSULE | Freq: Every evening | ORAL | Status: DC | PRN
  Administered 2023-10-01 – 2023-10-11 (×2): 25 mg via ORAL
  Filled 2023-09-27 (×2): qty 1

## 2023-09-27 MED ORDER — METHOCARBAMOL 500 MG PO TABS
750.0000 mg | ORAL_TABLET | Freq: Three times a day (TID) | ORAL | Status: DC | PRN
  Administered 2023-09-28 – 2023-10-21 (×15): 750 mg via ORAL
  Filled 2023-09-27 (×15): qty 2

## 2023-09-27 MED ORDER — QUETIAPINE FUMARATE 100 MG PO TABS
200.0000 mg | ORAL_TABLET | Freq: Every day | ORAL | Status: DC
  Administered 2023-09-27 – 2023-10-21 (×25): 200 mg via ORAL
  Filled 2023-09-27 (×25): qty 2

## 2023-09-27 MED ORDER — ENOXAPARIN SODIUM 60 MG/0.6ML IJ SOSY
50.0000 mg | PREFILLED_SYRINGE | INTRAMUSCULAR | Status: DC
  Administered 2023-09-27 – 2023-10-14 (×18): 50 mg via SUBCUTANEOUS
  Filled 2023-09-27 (×18): qty 0.6

## 2023-09-27 MED ORDER — ORAL CARE MOUTH RINSE: 15.0000 mL | OROMUCOSAL | Status: DC | PRN

## 2023-09-27 MED ORDER — METOPROLOL TARTRATE 50 MG PO TABS
50.0000 mg | ORAL_TABLET | Freq: Two times a day (BID) | ORAL | Status: DC
  Administered 2023-09-27 – 2023-10-22 (×50): 50 mg via ORAL
  Filled 2023-09-27 (×21): qty 1
  Filled 2023-09-27: qty 2
  Filled 2023-09-27 (×28): qty 1

## 2023-09-27 MED ORDER — VENLAFAXINE HCL ER 75 MG PO CP24
75.0000 mg | ORAL_CAPSULE | Freq: Every day | ORAL | Status: DC
  Administered 2023-09-28 – 2023-10-22 (×25): 75 mg via ORAL
  Filled 2023-09-27 (×25): qty 1

## 2023-09-27 MED ORDER — FENOFIBRATE 54 MG PO TABS
54.0000 mg | ORAL_TABLET | Freq: Every day | ORAL | Status: DC
  Administered 2023-09-27 – 2023-09-30 (×4): 54 mg via ORAL
  Filled 2023-09-27 (×5): qty 1

## 2023-09-27 MED ORDER — LEVETIRACETAM 500 MG PO TABS
500.0000 mg | ORAL_TABLET | Freq: Two times a day (BID) | ORAL | Status: DC
  Administered 2023-09-27 – 2023-10-22 (×51): 500 mg via ORAL
  Filled 2023-09-27 (×51): qty 1

## 2023-09-27 MED ORDER — ACETAMINOPHEN 325 MG PO TABS
650.0000 mg | ORAL_TABLET | Freq: Four times a day (QID) | ORAL | Status: DC | PRN
  Administered 2023-10-01 – 2023-10-14 (×3): 650 mg via ORAL
  Filled 2023-09-27 (×4): qty 2

## 2023-09-27 MED ORDER — ACETAMINOPHEN 500 MG PO TABS: 500.0000 mg | ORAL_TABLET | Freq: Every evening | ORAL | Status: DC | PRN

## 2023-09-27 MED ORDER — ACETAMINOPHEN 650 MG RE SUPP: 650.0000 mg | Freq: Four times a day (QID) | RECTAL | Status: DC | PRN

## 2023-09-27 MED ORDER — RIZATRIPTAN BENZOATE 10 MG PO TBDP: 10.0000 mg | ORAL_TABLET | ORAL | Status: DC | PRN

## 2023-09-27 MED ORDER — DULOXETINE HCL 60 MG PO CPEP
60.0000 mg | ORAL_CAPSULE | Freq: Every day | ORAL | Status: DC
  Administered 2023-09-27 – 2023-10-21 (×25): 60 mg via ORAL
  Filled 2023-09-27 (×25): qty 1

## 2023-09-27 MED ORDER — SODIUM CHLORIDE 0.9 % IV SOLN
INTRAVENOUS | Status: DC
Start: 2023-09-27 — End: 2023-09-28

## 2023-09-27 MED ORDER — HYDROMORPHONE HCL 1 MG/ML IJ SOLN
1.0000 mg | Freq: Once | INTRAMUSCULAR | Status: AC
  Administered 2023-09-27: 1 mg via INTRAVENOUS
  Filled 2023-09-27: qty 1

## 2023-09-27 MED ORDER — SODIUM CHLORIDE 0.9 % IV BOLUS
1000.0000 mL | Freq: Once | INTRAVENOUS | Status: AC
  Administered 2023-09-27: 1000 mL via INTRAVENOUS

## 2023-09-27 MED ORDER — CLONAZEPAM 0.5 MG PO TABS
1.0000 mg | ORAL_TABLET | Freq: Two times a day (BID) | ORAL | Status: DC
  Administered 2023-09-27 – 2023-10-22 (×50): 1 mg via ORAL
  Filled 2023-09-27 (×50): qty 2

## 2023-09-27 MED ORDER — DIPHENHYDRAMINE-APAP (SLEEP) 25-500 MG PO TABS: 2.0000 | ORAL_TABLET | Freq: Every evening | ORAL | Status: DC | PRN

## 2023-09-27 MED ORDER — ATORVASTATIN CALCIUM 10 MG PO TABS
20.0000 mg | ORAL_TABLET | Freq: Every day | ORAL | Status: DC
  Administered 2023-09-27 – 2023-09-30 (×4): 20 mg via ORAL
  Filled 2023-09-27 (×4): qty 2

## 2023-09-27 MED ORDER — PREGABALIN 25 MG PO CAPS
25.0000 mg | ORAL_CAPSULE | Freq: Every day | ORAL | Status: DC
  Administered 2023-09-27 – 2023-10-18 (×22): 25 mg via ORAL
  Filled 2023-09-27 (×23): qty 1

## 2023-09-27 MED ORDER — HYDROMORPHONE HCL 1 MG/ML IJ SOLN
1.0000 mg | INTRAMUSCULAR | Status: DC | PRN
  Administered 2023-09-27 – 2023-10-14 (×60): 1 mg via INTRAVENOUS
  Filled 2023-09-27 (×65): qty 1

## 2023-09-27 MED ORDER — LACTULOSE 10 GM/15ML PO SOLN
20.0000 g | Freq: Once | ORAL | Status: AC
  Administered 2023-09-27: 20 g via ORAL
  Filled 2023-09-27: qty 30

## 2023-09-27 MED ORDER — ONDANSETRON HCL 4 MG/2ML IJ SOLN
4.0000 mg | Freq: Once | INTRAMUSCULAR | Status: AC
  Administered 2023-09-27: 4 mg via INTRAVENOUS
  Filled 2023-09-27: qty 2

## 2023-09-27 MED ORDER — SENNOSIDES-DOCUSATE SODIUM 8.6-50 MG PO TABS
1.0000 | ORAL_TABLET | Freq: Two times a day (BID) | ORAL | Status: DC
  Administered 2023-09-27 – 2023-10-12 (×25): 1 via ORAL
  Filled 2023-09-27 (×28): qty 1

## 2023-09-27 MED ORDER — OXYCODONE HCL 5 MG PO TABS
5.0000 mg | ORAL_TABLET | ORAL | Status: DC | PRN
  Administered 2023-09-27 – 2023-10-21 (×60): 5 mg via ORAL
  Filled 2023-09-27 (×65): qty 1

## 2023-09-27 NOTE — ED Triage Notes (Signed)
 Pt endorses abd pain. Pt seen at [facility] yesterday, diagnosed with pancreatitis and given 2 enemas. States she has not picked up prescription from yesterday. Symptoms for 1/5 weeks include SOB, heart racing and nausea.

## 2023-09-27 NOTE — ED Notes (Signed)
Got patient on the monitor patient is resting with call bell in reach and family at bedside °

## 2023-09-27 NOTE — ED Provider Notes (Signed)
 [facility] Provider Note   CSN: [id number] Arrival date & time: 09/27/23  4132     History  Chief Complaint  Patient presents with   Abdominal Pain    THEDORA Parker is a 53 y.o. female.   Abdominal Pain Patient abdominal pain.  Nausea vomiting.  Has had symptoms for couple weeks.  Seen at [facility] yesterday and diagnosed with pancreatitis.  Had enemas.  Continued vomiting.  Continued abdominal pain.  Unrelieved with her pain medicines at home.  States she is not sure her home medicines are not being vomited up.  State is     Home Medications Prior to Admission medications   Medication Sig Start Date End Date Taking? Authorizing Provider  albuterol  (VENTOLIN  HFA) 108 (90 Base) MCG/ACT inhaler inhale 2 puffs into the lungs by mouth every 4 hours as needed for wheezing 11/17/20  Yes [provider]  DULoxetine  (CYMBALTA ) 30 MG capsule Take 30 mg by mouth. 03/22/22  Yes [provider]  DULoxetine  (CYMBALTA ) 60 MG capsule Take 60 mg by mouth. 11/17/20  Yes [provider]  fenofibrate (TRICOR) 48 MG tablet Take 1 tablet by mouth daily. 03/22/22  Yes [provider]  levothyroxine (SYNTHROID) 25 MCG tablet Take 1 tablet by mouth daily. 06/28/22  Yes [provider]  zolmitriptan (ZOMIG) 5 MG tablet Take by mouth. 07/26/23  Yes [provider]  Atogepant  (QULIPTA ) 30 MG TABS Take 30 mg by mouth daily. 06/01/21   Kendra Brasil, MD  atorvastatin (LIPITOR) 20 MG tablet Take 20 mg by mouth daily.    [provider]  celecoxib (CELEBREX) 200 MG capsule Take 200 mg by mouth daily.    [provider]  Cholecalciferol (VITAMIN D3) 2000 UNITS TABS Take 2,000 Units by mouth daily.     [provider]  clonazePAM  (KLONOPIN ) 0.5 MG tablet Take 1 mg by mouth 2 (two) times daily.    [provider]  HYDROcodone -acetaminophen  (NORCO/VICODIN) 5-325 MG tablet Take 1 tablet by  mouth 3 (three) times daily as needed.    [provider]  lisinopril (PRINIVIL,ZESTRIL) 10 MG tablet Take 10 mg by mouth daily.  08/22/17   [provider]  methocarbamol  (ROBAXIN ) 750 MG tablet Take every 8 hours to treat acute migraines. #30 allowed per month. 02/20/21   Kendra Brasil, MD  metoprolol (LOPRESSOR) 50 MG tablet Take 50 mg by mouth 2 (two) times daily.  12/14/14   [provider]  naloxone (NARCAN) nasal spray 4 mg/0.1 mL SMARTSIG:Both Nares    [provider]  NURTEC 75 MG TBDP Take by mouth.    [provider]  ondansetron  (ZOFRAN -ODT) 4 MG disintegrating tablet Take 1 tablet (4 mg total) by mouth every 8 (eight) hours as needed. 09/26/23   Parker, Kendra L, PA  perphenazine (TRILAFON) 4 MG tablet 3 (three) times daily. 11/27/14   [provider]  pregabalin (LYRICA) 25 MG capsule Take 25 mg by mouth 2 (two) times daily.    [provider]  QUEtiapine (SEROQUEL) 200 MG tablet Take 200 mg by mouth at bedtime.    [provider]  rizatriptan  (MAXALT -MLT) 10 MG disintegrating tablet Take by mouth.    [provider]  spironolactone (ALDACTONE) 50 MG tablet Take 50 mg by mouth. Take 0.5 tablet daily.    [provider]  SUMAtriptan (IMITREX) 100 MG tablet Take by mouth.    [provider]  Ubrogepant  (UBRELVY ) 50 MG  TABS Take 1 tablet by mouth as needed. Take 1 tab at onset of migraine. May repeat in 2 hrs, if needed. Max dose: 2 tabs/day.This is a 30 day prescription. 07/10/20   Kendra Hammed, NP  venlafaxine  XR (EFFEXOR  XR) 37.5 MG 24 hr capsule Take 1 capsule (37.5 mg total) by mouth daily with breakfast. 07/10/20   Kendra Hammed, NP      Allergies    Lamotrigine, Topiramate, and Penicillins    Review of Systems   Review of Systems  Gastrointestinal:  Positive for abdominal pain.    Physical Exam Updated Vital Signs BP (!) 156/106 (BP Location: Right Arm)   Pulse (!) 144   Temp 99.2 F  (37.3 C)   Resp (!) 22   Ht 5\' 3"  (1.6 m)   Wt 110 kg   SpO2 98%   BMI 42.96 kg/m  Physical Exam Vitals reviewed.  Eyes:     Comments: Patient is wearing sunglasses  Cardiovascular:     Rate and Rhythm: Tachycardia present.  Abdominal:     Tenderness: There is abdominal tenderness.     Comments: Diffuse tenderness.  No rebound or guarding.  Skin:    General: Skin is warm.  Neurological:     Mental Status: She is alert.     ED Results / Procedures / Treatments   Labs (all labs ordered are listed, but only abnormal results are displayed) Labs Reviewed  LIPASE, BLOOD - Abnormal; Notable for the following components:      Result Value   Lipase 98 (*)    All other components within normal limits  COMPREHENSIVE METABOLIC PANEL WITH GFR - Abnormal; Notable for the following components:   Sodium 132 (*)    Chloride 97 (*)    CO2 20 (*)    Glucose, Bld 119 (*)    Albumin 2.6 (*)    AST 51 (*)    All other components within normal limits  CBC - Abnormal; Notable for the following components:   RBC 3.51 (*)    Hemoglobin 10.9 (*)    HCT 33.2 (*)    All other components within normal limits  LACTATE DEHYDROGENASE - Abnormal; Notable for the following components:   LDH 295 (*)    All other components within normal limits    EKG EKG Interpretation Date/Time:  Tuesday Sep 27 2023 09:40:30 EDT Ventricular Rate:  138 PR Interval:  160 QRS Duration:  74 QT Interval:  270 QTC Calculation: 409 R Axis:   59  Text Interpretation: Sinus tachycardia Cannot rule out Anterior infarct , age undetermined Abnormal ECG When compared with ECG of 26-Sep-2023 17:19, No significant change since last tracing Confirmed by Mozell Arias [phone]) on 09/27/2023 10:42:11 AM  Radiology US  Abdomen Limited RUQ (LIVER/GB) Result Date: 09/26/2023 CLINICAL DATA:  [id number] Pancreatitis [id number] Transaminitis [id number] EXAM: ULTRASOUND ABDOMEN LIMITED RIGHT UPPER QUADRANT COMPARISON:  None Available.  FINDINGS: Gallbladder: No gallstones, pericholecystic fluid or wall thickening visualized. Common bile duct: Diameter: 0.4 cm. Liver: Liver is hyperechoic consistent with fatty infiltration. No focal hepatic lesions identified. No intrahepatic ductal dilatation. Hepatopetal portal vein flow. IMPRESSION: Hepatic fatty infiltration. Otherwise unremarkable examination of the liver and gallbladder. Electronically Signed   By: Sydell Eva M.D.   On: 09/26/2023 21:06   CT ABDOMEN PELVIS W CONTRAST Result Date: 09/26/2023 CLINICAL DATA:  Tachycardia, shortness of breath, abdominal pain, nausea, vomiting, and diarrhea EXAM: CT ANGIOGRAPHY CHEST CT ABDOMEN AND PELVIS WITH CONTRAST TECHNIQUE: Multidetector CT  imaging of the chest was performed using the standard protocol during bolus administration of intravenous contrast. Multiplanar CT image reconstructions and MIPs were obtained to evaluate the vascular anatomy. Multidetector CT imaging of the abdomen and pelvis was performed using the standard protocol during bolus administration of intravenous contrast. RADIATION DOSE REDUCTION: This exam was performed according to the departmental dose-optimization program which includes automated exposure control, adjustment of the mA and/or kV according to patient size and/or use of iterative reconstruction technique. CONTRAST:  OMNIPAQUE  IOHEXOL  350 MG/ML SOLN COMPARISON:  CTA chest dated 04/08/2023, CT abdomen and pelvis dated 11/28/2020 FINDINGS: CTA CHEST FINDINGS Cardiovascular: The study is adequate for the evaluation of pulmonary embolism to the level of the distal segmental pulmonary arteries in the setting of motion artifact and somewhat suboptimal contrast bolus timing. No definite filling defect to the level of the distal segmental pulmonary arteries. Apparent focal expansion and hypodensity of the left lower lobar posterior segmental artery (5:125) without correlate on subsequent portal venous phase image  (11:3), likely artifactual. Great vessels are normal in course and caliber. Normal heart size. No significant pericardial fluid/thickening. Mediastinum/Nodes: Imaged thyroid gland without nodules meeting criteria for imaging follow-up by size. Normal esophagus. No pathologically enlarged axillary, supraclavicular, mediastinal, or hilar lymph nodes. Lungs/Pleura: The central airways are patent. Diffuse mosaic attenuation. Bibasilar linear subsegmental atelectasis. No pneumothorax. No pleural effusion. Musculoskeletal: No acute or abnormal lytic or blastic osseous lesions. Multilevel degenerative changes of the thoracic spine. Review of the MIP images confirms the above findings. CT ABDOMEN and PELVIS FINDINGS Hepatobiliary: No focal hepatic lesions. No intra or extrahepatic biliary ductal dilation. Normal gallbladder. Pancreas: Diffusely edematous appearance of the pancreas with surrounding peripancreatic stranding and free fluid. No main pancreatic ductal dilation. Spleen: Normal in size without focal abnormality. Adrenals/Urinary Tract: No adrenal nodules. No suspicious renal mass, calculi, or hydronephrosis. Mild mural thickening of the right mid ureter, likely reactive. No focal bladder wall thickening. Stomach/Bowel: Normal appearance of the stomach. No evidence of bowel wall thickening, distention, or inflammatory changes. Moderate volume mildly hyperattenuating, desiccated stool throughout the colon. Normal appendix. Vascular/Lymphatic: Aortic atherosclerosis. No enlarged abdominal or pelvic lymph nodes. Reproductive: No adnexal masses. Other: Parker volume free fluid extending from the level of pancreas along the anterior renal fascia into the pelvis. No free air or fluid collection. Mildly nodular appearance of the omentum, likely related to mesenteric edema. Musculoskeletal: No acute or abnormal lytic or blastic osseous lesions. Mild subcutaneous soft tissue edema along the right anterior abdomen.  IMPRESSION: 1. No definite pulmonary embolism to the level of the distal segmental pulmonary arteries in the setting of motion artifact and somewhat suboptimal contrast bolus timing. 2. Acute interstitial edematous pancreatitis. No evidence of pancreatic necrosis or fluid collection. 3. Diffuse mosaic attenuation, which can be seen in the setting of Parker airways disease. 4. Moderate volume mildly hyperattenuating, desiccated stool throughout the colon, which can be seen in the setting of constipation. 5.  Aortic Atherosclerosis (ICD10-I70.0). Electronically Signed   By: Limin  Xu M.D.   On: 09/26/2023 19:30   CT Angio Chest PE W and/or Wo Contrast Result Date: 09/26/2023 CLINICAL DATA:  Tachycardia, shortness of breath, abdominal pain, nausea, vomiting, and diarrhea EXAM: CT ANGIOGRAPHY CHEST CT ABDOMEN AND PELVIS WITH CONTRAST TECHNIQUE: Multidetector CT imaging of the chest was performed using the standard protocol during bolus administration of intravenous contrast. Multiplanar CT image reconstructions and MIPs were obtained to evaluate the vascular anatomy. Multidetector CT imaging of the abdomen and  pelvis was performed using the standard protocol during bolus administration of intravenous contrast. RADIATION DOSE REDUCTION: This exam was performed according to the departmental dose-optimization program which includes automated exposure control, adjustment of the mA and/or kV according to patient size and/or use of iterative reconstruction technique. CONTRAST:  OMNIPAQUE  IOHEXOL  350 MG/ML SOLN COMPARISON:  CTA chest dated 04/08/2023, CT abdomen and pelvis dated 11/28/2020 FINDINGS: CTA CHEST FINDINGS Cardiovascular: The study is adequate for the evaluation of pulmonary embolism to the level of the distal segmental pulmonary arteries in the setting of motion artifact and somewhat suboptimal contrast bolus timing. No definite filling defect to the level of the distal segmental pulmonary arteries.  Apparent focal expansion and hypodensity of the left lower lobar posterior segmental artery (5:125) without correlate on subsequent portal venous phase image (11:3), likely artifactual. Great vessels are normal in course and caliber. Normal heart size. No significant pericardial fluid/thickening. Mediastinum/Nodes: Imaged thyroid gland without nodules meeting criteria for imaging follow-up by size. Normal esophagus. No pathologically enlarged axillary, supraclavicular, mediastinal, or hilar lymph nodes. Lungs/Pleura: The central airways are patent. Diffuse mosaic attenuation. Bibasilar linear subsegmental atelectasis. No pneumothorax. No pleural effusion. Musculoskeletal: No acute or abnormal lytic or blastic osseous lesions. Multilevel degenerative changes of the thoracic spine. Review of the MIP images confirms the above findings. CT ABDOMEN and PELVIS FINDINGS Hepatobiliary: No focal hepatic lesions. No intra or extrahepatic biliary ductal dilation. Normal gallbladder. Pancreas: Diffusely edematous appearance of the pancreas with surrounding peripancreatic stranding and free fluid. No main pancreatic ductal dilation. Spleen: Normal in size without focal abnormality. Adrenals/Urinary Tract: No adrenal nodules. No suspicious renal mass, calculi, or hydronephrosis. Mild mural thickening of the right mid ureter, likely reactive. No focal bladder wall thickening. Stomach/Bowel: Normal appearance of the stomach. No evidence of bowel wall thickening, distention, or inflammatory changes. Moderate volume mildly hyperattenuating, desiccated stool throughout the colon. Normal appendix. Vascular/Lymphatic: Aortic atherosclerosis. No enlarged abdominal or pelvic lymph nodes. Reproductive: No adnexal masses. Other: Parker volume free fluid extending from the level of pancreas along the anterior renal fascia into the pelvis. No free air or fluid collection. Mildly nodular appearance of the omentum, likely related to mesenteric  edema. Musculoskeletal: No acute or abnormal lytic or blastic osseous lesions. Mild subcutaneous soft tissue edema along the right anterior abdomen. IMPRESSION: 1. No definite pulmonary embolism to the level of the distal segmental pulmonary arteries in the setting of motion artifact and somewhat suboptimal contrast bolus timing. 2. Acute interstitial edematous pancreatitis. No evidence of pancreatic necrosis or fluid collection. 3. Diffuse mosaic attenuation, which can be seen in the setting of Parker airways disease. 4. Moderate volume mildly hyperattenuating, desiccated stool throughout the colon, which can be seen in the setting of constipation. 5.  Aortic Atherosclerosis (ICD10-I70.0). Electronically Signed   By: Limin  Xu M.D.   On: 09/26/2023 19:30    Procedures Procedures    Medications Ordered in ED Medications  ondansetron  (ZOFRAN ) injection 4 mg (4 mg Intravenous Given 09/27/23 1111)  HYDROmorphone (DILAUDID) injection 1 mg (1 mg Intravenous Given 09/27/23 1111)  sodium chloride  0.9 % bolus 1,000 mL (1,000 mLs Intravenous New Bag/Given 09/27/23 1111)    ED Course/ Medical Decision Making/ A&P                                 Medical Decision Making Amount and/or Complexity of Data Reviewed Labs: ordered.  Risk Prescription drug management. Decision regarding  hospitalization.   Patient with nausea vomit abdominal pain.  Recently seen for same diagnosed with pancreatitis.  Had CT scan ultrasound and CTA pulmonary embolism.  Continued pain.  Unable to keep down orals.  Reviewed notes.  Reviewed CT scans.  Will likely require admission to hospital.  No gallstones.  Will occasionally drink alcohol.  Lipids mildly elevated but not severely.  Ultrasound shows No gallstones.  Lipase actually decreased from yesterday.  Still has not been tolerating orals however.  Will admit to hospitalist.  Discussed with Dr. Felipe Horton.        Final Clinical Impression(s) / ED Diagnoses Final diagnoses:   Acute pancreatitis, unspecified complication status, unspecified pancreatitis type    Rx / DC Orders ED Discharge Orders     None         Mozell Arias, MD 09/27/23 1148

## 2023-09-27 NOTE — H&P (Addendum)
 History and Physical    Patient: Kendra Parker UJW:[id number] DOB: Nov 09, 1970 DOA: 09/27/2023 DOS: the patient was seen and examined on 09/27/2023 PCP: Joenathan Muslim, FNP  Patient coming from: Home  Chief Complaint:  Chief Complaint  Patient presents with   Abdominal Pain   HPI: Kendra Parker is a 53 y.o. female with medical history significant of hypertension, dyslipidemia, chronic migraine headaches, chronic pain, and morbid obesity presents with complaints of abdominal pain and shortness of breath. She is accompanied by her sister.  The patient has been experiencing dull abdominal pain for the past two and a half weeks, located across the upper and lower abdomen, radiating to the back. She describes the pain as 'like two blades chopping'.  She has nausea nausea and vomiting, but reports last episode of vomiting was approximately 2 days ago.  Her last bowel movement was two weeks ago, and she is not passing gas.  She has tried 4 days of MiraLAX without improvement in symptoms.  She experiences shortness of breath and uses an albuterol  inhaler, taking two puffs every four hours. No recent increase in alcohol consumption is reported, and she describes herself as a casual drinker.  She was seen a [facility] emergency department yesterday due to her symptoms.  CT imaging of the abdomen pelvis have been obtained yesterday which revealed acute interstitial edematous pancreatitis with no evidence of necrosis or fluid collection, and was noted to have moderate constipation.  Right upper quadrant ultrasound noted hepatic fatty infiltration with unremarkable gallbladder.  Patient had been given IV fluids, antiemetics, and pain medications with improvement in symptoms.  She was discharged home with prescriptions for Zofran  and GoLytely.  In the emergency department patient was noted to be afebrile with heart rates elevated up to 144, respirations 20-22, blood pressures elevated up to  156/106, and O2 saturations maintained.  Labs noted hemoglobin 10.9, sodium 132, chloride 97, CO2 20, lipase 98,  AST 51, and LDH 295.  Patient had been given 1 L normal saline IV fluids, Zofran , and Dilaudid 1 mg IV.  Review of Systems: As mentioned in the history of present illness. All other systems reviewed and are negative. Past Medical History:  Diagnosis Date   Anxiety    Hypertension    Migraine    Numbness    Left leg   Pelvic pain    Past Surgical History:  Procedure Laterality Date   ABDOMINAL HYSTERECTOMY     Partial   INNER EAR SURGERY     LAPAROSCOPIC OVARIAN CYSTECTOMY     Left   Social History:  reports that she has quit smoking. Her smoking use included cigarettes and e-cigarettes. She has never used smokeless tobacco. She reports that she does not drink alcohol and does not use drugs.  Allergies  Allergen Reactions   Lamotrigine Rash    Only at 100 mg is intolerable-gets a rash from head to toe   Topiramate Other (See Comments)    Visual problems   Penicillins Rash    Family History  Problem Relation Age of Onset   Multiple myeloma Mother    Breast cancer Mother    Emphysema Father     Prior to Admission medications   Medication Sig Start Date End Date Taking? Authorizing Provider  Atogepant  (QULIPTA ) 30 MG TABS Take 30 mg by mouth daily. 06/01/21   Phebe Brasil, MD  atorvastatin (LIPITOR) 20 MG tablet Take 20 mg by mouth daily.    [provider]  Cholecalciferol (VITAMIN D3) 2000 UNITS TABS Take 2,000 Units by mouth daily.     [provider]  lisinopril (PRINIVIL,ZESTRIL) 10 MG tablet Take 10 mg by mouth daily.  08/22/17   [provider]  methocarbamol  (ROBAXIN ) 750 MG tablet Take every 8 hours to treat acute migraines. #30 allowed per month. 02/20/21   Phebe Brasil, MD  metoprolol (LOPRESSOR) 50 MG tablet Take 50 mg by mouth 2 (two) times daily.  12/14/14   [provider]  ondansetron  (ZOFRAN -ODT) 4 MG disintegrating  tablet Take 1 tablet (4 mg total) by mouth every 8 (eight) hours as needed. 09/26/23   Small, Brooke L, PA  perphenazine (TRILAFON) 4 MG tablet 3 (three) times daily. 11/27/14   [provider]  QUEtiapine (SEROQUEL) 200 MG tablet Take 200 mg by mouth at bedtime.    [provider]  spironolactone (ALDACTONE) 50 MG tablet Take 50 mg by mouth. Take 0.5 tablet daily.    [provider]  Ubrogepant  (UBRELVY ) 50 MG TABS Take 1 tablet by mouth as needed. Take 1 tab at onset of migraine. May repeat in 2 hrs, if needed. Max dose: 2 tabs/day.This is a 30 day prescription. 07/10/20   Wess Hammed, NP  venlafaxine  XR (EFFEXOR  XR) 37.5 MG 24 hr capsule Take 1 capsule (37.5 mg total) by mouth daily with breakfast. 07/10/20   Wess Hammed, NP    Physical Exam: Vitals:   09/27/23 0938 09/27/23 0939  BP: (!) 156/106   Pulse: (!) 144   Resp: (!) 22   Temp: 99.2 F (37.3 C)   SpO2: 98%   Weight:  110 kg  Height:  5\' 3"  (1.6 m)   Constitutional: Middle-aged female Eyes: PERRL, lids and conjunctivae normal ENMT: Mucous membranes are moist. Posterior pharynx clear of any exudate or lesions.Normal dentition.  Neck: normal, supple, no masses, no thyromegaly Respiratory: Mildly tachypneic without wheezes or rhonchi appreciated.  O2 saturations maintained on room air. Cardiovascular: Tachycardic. No extremity edema. 2+ pedal pulses. No carotid bruits.  Abdomen: Patient appears to be guarding.  Bowel sounds present Musculoskeletal: no clubbing / cyanosis. No joint deformity upper and lower extremities. Good ROM, no contractures. Normal muscle tone.  Skin: no rashes, lesions, ulcers. No induration Neurologic: CN 2-12 grossly intact. Strength 5/5 in all 4.  Psychiatric: Normal judgment and insight. Alert and oriented x 3. Normal mood.   Data Reviewed:  EKG reveals sinus tachycardia 138 beats per minute.  Reviewed labs, imaging, and pertinent records as documented.  Assessment and  Plan:  Pancreatitis Acute.  Patient presents with persistent abdominal pain.  Found to have findings concerning for pancreatitis yesterday on CT imaging without abscess or necrosis.  Ultrasound did not show any signs to give concern for cholelithiasis or choledocholithiasis.  Patient reports occasional alcohol use.  No recent medication changes as been on fenofibrate and Seroquel for several years which have a low incidence of pancreatitis. - Admit to a medical telemetry bed - Clear liquid diet and advance as tolerated - Check triglyceride level - Continue normal saline IV fluids at 125 mL/h - Oxycodone / Dilaudid IV as needed for moderate to severe pain respectively  Dyspnea Acute.  Patient reports having increasing shortness of breath.  O2 saturations maintained on room currently.  Possibly related to pancreatitis. - Incentive spirometry - Check chest x-ray  Constipation Acute.  Patient reports not having a significant bowel movement in 2 weeks.  Patient is on chronic pain medications of hydrocodone  which  is likely a factor.  CT imaging from 5/5 showed did not note any signs for bowel obstruction. - Lactulose 30 g p.o. x 1 dose - Smog enema this evening - Senokot-S twice daily  Sinus tachycardia Essential hypertension  Blood pressures currently maintained. Heart rate is noted to be elevated into the 140s.  Possibly related to poor p.o. intake.  Patient is supposed to be on metoprolol, but last dose was 2 days ago. -Continue metoprolol and lisinopril - Held spironolactone  Migraine headaches - Continue current medication regimen  Chronic pain Patient reports having chronic pain related to her neck and shoulders for which she is on Cymbalta  and hydrocodone  in the outpatient setting and followed by pain management. -Continue Cymbalta  - Continue outpatient follow-up with pain management  Dyslipidemia Last lipid panel: 06/20/2023 noted total cholesterol 137, triglycerides 215, HDL 51,  LDL 58. -Continue atorvastatin and fenofibrate  Hypothyroidism - Check TSH - Continue levothyroxine  Insomnia - Continue Seroquel(Small incidence noted of pancreatitis noted medication patient has been on this for over 25 years.)  Morbid obesity BMI 42.96 kg/m.  DVT prophylaxis: Lovenox  Advance Care Planning:   Code Status: Full Code   Consults: None  Family Communication: Sister updated at bedside  Severity of Illness: The appropriate patient status for this patient is OBSERVATION. Observation status is judged to be reasonable and necessary in order to provide the required intensity of service to ensure the patient's safety. The patient's presenting symptoms, physical exam findings, and initial radiographic and laboratory data in the context of their medical condition is felt to place them at decreased risk for further clinical deterioration. Furthermore, it is anticipated that the patient will be medically stable for discharge from the hospital within 2 midnights of admission.   Author: Lena Qualia, MD 09/27/2023 11:16 AM  For on call review [email or web address].

## 2023-09-27 NOTE — Plan of Care (Signed)

## 2023-09-27 NOTE — ED Notes (Signed)
 Per Dr Felipe Horton, okay to give PO oxy and IV dilaudid at the same time to help with pain control. Primary RN upstairs made aware of POC for pain control

## 2023-09-28 DIAGNOSIS — K859 Acute pancreatitis without necrosis or infection, unspecified: Secondary | ICD-10-CM | POA: Diagnosis not present

## 2023-09-28 LAB — COMPREHENSIVE METABOLIC PANEL WITH GFR
ALT: 28 U/L (ref 0–44)
AST: 50 U/L — ABNORMAL HIGH (ref 15–41)
Albumin: 2.2 g/dL — ABNORMAL LOW (ref 3.5–5.0)
Alkaline Phosphatase: 107 U/L (ref 38–126)
Anion gap: 12 (ref 5–15)
BUN: 7 mg/dL (ref 6–20)
CO2: 23 mmol/L (ref 22–32)
Calcium: 8.8 mg/dL — ABNORMAL LOW (ref 8.9–10.3)
Chloride: 100 mmol/L (ref 98–111)
Creatinine, Ser: 0.55 mg/dL (ref 0.44–1.00)
GFR, Estimated: >60 mL/min (ref >60)
Glucose, Bld: 117 mg/dL — ABNORMAL HIGH (ref 70–99)
Potassium: 4.3 mmol/L (ref 3.5–5.1)
Sodium: 135 mmol/L (ref 135–145)
Total Bilirubin: 0.4 mg/dL (ref 0.0–1.2)
Total Protein: 5.9 g/dL — ABNORMAL LOW (ref 6.5–8.1)

## 2023-09-28 LAB — CBC
HCT: 31.3 % — ABNORMAL LOW (ref 36.0–46.0)
Hemoglobin: 10.3 g/dL — ABNORMAL LOW (ref 12.0–15.0)
MCH: 31.5 pg (ref 26.0–34.0)
MCHC: 32.9 g/dL (ref 30.0–36.0)
MCV: 95.7 fL (ref 80.0–100.0)
Platelets: 341 10*3/uL (ref 150–400)
RBC: 3.27 MIL/uL — ABNORMAL LOW (ref 3.87–5.11)
RDW: 14.1 % (ref 11.5–15.5)
WBC: 11.8 10*3/uL — ABNORMAL HIGH (ref 4.0–10.5)
nRBC: 0 % (ref 0.0–0.2)

## 2023-09-28 MED ORDER — SODIUM CHLORIDE 0.9 % IV SOLN: INTRAVENOUS | Status: DC

## 2023-09-28 NOTE — TOC CM/SW Note (Addendum)
 Transition of Care [facility]) - Inpatient Brief Assessment   Patient Details  Name: Kendra Parker MRN: [id number] Date of Birth: 06/13/1970  Transition of [facility]) CM/SW Contact:    Tom-Johnson, Angelique Ken, RN Phone Number: 09/28/2023, 4:06 PM   Clinical Narrative:  Patient presented to the ED with severe Abdominal pain, N/V/D and Tachycardia. CT scan showed acute Pancreatitis with no Necrosis or fluid collection. Patient continues on IV fluids and Pain control.    From home with husband and twin grand daughters, has a supportive daughter. Currently not employed and not on disability. Independent with her care prior to admit. Does not have DME's at home. PCP is  Joenathan Muslim, FNP and uses [facility] on [address]   Patient not Medically ready for discharge.  CM will continue to follow as patient progresses with care towards discharge.                                            Transition of Care Asessment: Insurance and Status: Insurance coverage has been reviewed Patient has primary care physician: Yes Home environment has been reviewed: Yes Prior level of function:: Independent Prior/Current Home Services: No current home services Social Drivers of Health Review: SDOH reviewed no interventions necessary Readmission risk has been reviewed: Yes Transition of care needs: no transition of care needs at this time

## 2023-09-28 NOTE — Plan of Care (Signed)
  Problem: Education: Goal: Knowledge of General Education information will improve Description: Including pain rating scale, medication(s)/side effects and non-pharmacologic comfort measures Outcome: Progressing   Problem: Health Behavior/Discharge Planning: Goal: Ability to manage health-related needs will improve Outcome: Progressing   Problem: Clinical Measurements: Goal: Ability to maintain clinical measurements within normal limits will improve Outcome: Progressing Goal: Will remain free from infection Outcome: Progressing Goal: Respiratory complications will improve Outcome: Progressing Goal: Cardiovascular complication will be avoided Outcome: Progressing   Problem: Coping: Goal: Level of anxiety will decrease Outcome: Progressing   Problem: Elimination: Goal: Will not experience complications related to bowel motility Outcome: Progressing   Problem: Safety: Goal: Ability to remain free from injury will improve Outcome: Progressing   Problem: Skin Integrity: Goal: Risk for impaired skin integrity will decrease Outcome: Progressing   Problem: Activity: Goal: Risk for activity intolerance will decrease Outcome: Not Progressing   Problem: Nutrition: Goal: Adequate nutrition will be maintained Outcome: Not Progressing   Problem: Pain Managment: Goal: General experience of comfort will improve and/or be controlled Outcome: Not Progressing    Pt encouraged to utilize prn pain meds. Obvious abdominal pain continues, limiting PO intake. + BM yesterday

## 2023-09-28 NOTE — Progress Notes (Signed)
 NEW ADMISSION NOTE New Admission Note:   Arrival Method: from ER, walked to bed, from home Mental Orientation: alert and oriented x 4 Telemetry: seconded Assessment: Completed Skin:intact IV: PIV Pain: 10, pt wants to wait until next dose due Tubes: none Safety Measures: Safety Fall Prevention Plan has been given, discussed and signed Admission: Completed [facility] Orientation: Patient has been orientated to the room, unit and staff.  Family: at bedside  Orders have been reviewed and implemented. Will continue to monitor the patient. Call light has been placed within reach and bed alarm has been activated.   Lakeithia Rasor A Proctor-Gann, RN

## 2023-09-28 NOTE — Plan of Care (Signed)
  Problem: Elimination: Goal: Will not experience complications related to bowel motility Outcome: Not Progressing   Problem: Safety: Goal: Ability to remain free from injury will improve Outcome: Not Progressing   Problem: Pain Managment: Goal: General experience of comfort will improve and/or be controlled Outcome: Not Progressing

## 2023-09-28 NOTE — Care Management Obs Status (Signed)
 MEDICARE OBSERVATION STATUS NOTIFICATION   Patient Details  Name: Kendra Parker MRN: [id number] Date of Birth: [birth year]   Medicare Observation Status Notification Given:  Yes   Moon letter given and siigned Wynonia Hedges 09/28/2023, 1:40 PM

## 2023-09-28 NOTE — Progress Notes (Signed)
 PROGRESS NOTE  Kendra Parker HKV:[id number] DOB: 1970/06/12 DOA: 09/27/2023 PCP: Joenathan Muslim, FNP   LOS: 0 days   Brief Narrative / Interim history: 52 year old with HTN, HLD, chronic migraine headaches comes into the hospital with dull abdominal pain for the past 2 and half weeks, radiating into her back.  This has been associated with nausea, vomiting.  Given persistent symptoms presented to the ER, and a CT scan revealed acute pancreatitis with no necrosis or fluid collection.  Right upper quadrant ultrasound showed fatty liver, and gallbladder was unremarkable.  She was admitted to the hospital.  Subjective / 24h Interval events: Ate some liquids for breakfast this morning, she is complaining of worsening abdominal pain  Assesement and Plan: Principal Problem:   Pancreatitis Active Problems:   Dyspnea   Constipation   Essential hypertension   Sinus tachycardia   Migraine   Chronic pain   Dyslipidemia   Hypothyroidism   Insomnia   Obesity, Class III, BMI 40-49.9 (morbid obesity)   Principal problem Acute pancreatitis -continue supportive care, clear liquids, IV fluids, pain control.  CT scan on admission without complicating factors such as abscesses or necrosis.  There is no gallbladder pathology, no cholelithiasis or choledocholithiasis seen on imaging - She was recently started back on her Ubrelvy  for her migraines, and there are few case reports of pancreatitis associated with this. - She was also recently placed on Vyepti infusion every 3 months, but I do not see a correlation between this and pancreatitis. - Sumatriptan can be linked with pancreatitis, but she tolerated it well for quite some time  Active problems Migraine headaches-on multiple medications at home, follows with neurology as an outpatient  Hyperlipidemia-continue statin  Hypothyroidism-continue Synthroid  Hypertension-continue metoprolol  Obesity, class III-BMI greater than 40.  She would  benefit from weight loss  Scheduled Meds:  atorvastatin  20 mg Oral Daily   clonazePAM   1 mg Oral BID   DULoxetine   30 mg Oral QHS   DULoxetine   60 mg Oral QHS   enoxaparin  (LOVENOX ) injection  50 mg Subcutaneous Q24H   fenofibrate  54 mg Oral Daily   levETIRAcetam  500 mg Oral BID   levothyroxine  25 mcg Oral Daily   metoprolol tartrate  50 mg Oral BID   pregabalin  25 mg Oral Daily   QUEtiapine  200 mg Oral QHS   senna-docusate  1 tablet Oral BID   sodium chloride  flush  3 mL Intravenous Q12H   SUMAtriptan  100 mg Oral Daily   venlafaxine  XR  75 mg Oral Q breakfast   Continuous Infusions:  sodium chloride  125 mL/hr at 09/28/23 0848   PRN Meds:.acetaminophen  **OR** acetaminophen , acetaminophen  **AND** diphenhydrAMINE, albuterol , HYDROmorphone (DILAUDID) injection, methocarbamol , mouth rinse, oxyCODONE  Current Outpatient Medications  Medication Instructions   albuterol  (VENTOLIN  HFA) 108 (90 Base) MCG/ACT inhaler inhale 2 puffs into the lungs by mouth every 4 hours as needed for wheezing   atorvastatin (LIPITOR) 20 mg, Oral, Daily   celecoxib (CELEBREX) 200 mg, Oral, Daily   clonazePAM  (KLONOPIN ) 1 mg, Oral, 2 times daily   Cyanocobalamin (VITAMIN B-12 PO) 1 capsule, Oral, Daily   diphenhydramine-acetaminophen  (TYLENOL  PM) 25-500 MG TABS tablet 2 tablets, Oral, Daily at bedtime   DULoxetine  (CYMBALTA ) 30 mg, Oral, Daily at bedtime   DULoxetine  (CYMBALTA ) 60 mg, Oral, Daily at bedtime   Eptinezumab-jjmr (VYEPTI IV) 300 mg, Intravenous, Every 3 months, VYEPTI   fenofibrate (TRICOR) 48 MG tablet 1 tablet, Oral, Daily  FIBER PO 3 tablets, Oral, 2 times daily   HYDROcodone -acetaminophen  (NORCO/VICODIN) 5-325 MG tablet 1 tablet, Oral, 3 times daily   levETIRAcetam (KEPPRA) 500 mg, Oral, 2 times daily   levothyroxine (SYNTHROID) 25 MCG tablet 1 tablet, Oral, Daily   lisinopril (ZESTRIL) 10 mg, Oral, Daily   MAGNESIUM PO 1 tablet, Oral, Daily   methocarbamol  (ROBAXIN ) 750 MG  tablet Take every 8 hours to treat acute migraines. #30 allowed per month.   metoprolol tartrate (LOPRESSOR) 50 mg, Oral, 2 times daily   Multiple Vitamins-Minerals (MULTIVITAMIN WITH MINERALS) tablet 1 tablet, Oral, Daily   naloxone (NARCAN) 0.4 mg, Nasal,  Once   Nurtec 75 mg, Oral, Every other day   ondansetron  (ZOFRAN -ODT) 4 mg, Oral, Every 8 hours PRN   pregabalin (LYRICA) 25 mg, Oral, Daily   QUEtiapine (SEROQUEL) 200 mg, Oral, Daily at bedtime   rizatriptan  (MAXALT -MLT) 10 mg, Oral, As needed   spironolactone (ALDACTONE) 25 mg, Oral, Daily   SUMAtriptan (IMITREX) 100 mg, Oral, Daily   Ubrelvy  100 mg, Oral, As needed   venlafaxine  XR (EFFEXOR  XR) 37.5 mg, Oral, Daily with breakfast   Vitamin D3 2,000 Units, Oral, Daily    Diet Orders (From admission, onward)     Start     Ordered   09/27/23 1148  Diet clear liquid Room service appropriate? Yes; Fluid consistency: Thin  Diet effective now       Question Answer Comment  Room service appropriate? Yes   Fluid consistency: Thin      09/27/23 1152            DVT prophylaxis:    Lab Results  Component Value Date   PLT 341 09/28/2023      Code Status: Full Code  Family Communication: family at bedside   Status is: Observation The patient will require care spanning > 2 midnights and should be moved to inpatient because: IV fluids, pain control, unable to take p.o.   Level of care: Telemetry Medical  Consultants:  None   Objective: Vitals:   09/27/23 2054 09/27/23 2058 09/28/23 0543 09/28/23 0828  BP: 115/87  (!) 125/53 130/73  Pulse: 88  (!) 103 96  Resp: (!) 27 20  18   Temp: 98.8 F (37.1 C)  98.5 F (36.9 C) 98.2 F (36.8 C)  TempSrc: Oral  Oral Oral  SpO2: 95%  94% 97%  Weight:      Height:        Intake/Output Summary (Last 24 hours) at 09/28/2023 1108 Last data filed at 09/28/2023 0950 Gross per 24 hour  Intake 1354.36 ml  Output 0 ml  Net 1354.36 ml   Wt Readings from Last 3 Encounters:   09/27/23 110 kg  09/26/23 110.2 kg  11/28/20 105.7 kg    Examination:  Constitutional: NAD Eyes: no scleral icterus ENMT: Mucous membranes are moist.  Neck: normal, supple Respiratory: clear to auscultation bilaterally, no wheezing, no crackles. Normal respiratory effort. No accessory muscle use.  Cardiovascular: Regular rate and rhythm, no murmurs / rubs / gallops.  Abdomen: non distended, no tenderness. Bowel sounds positive.  Musculoskeletal: no clubbing / cyanosis.   Data Reviewed: I have independently reviewed following labs and imaging studies   CBC Recent Labs  Lab 09/26/23 1733 09/27/23 0943 09/28/23 0500  WBC 7.4 9.9 11.8*  HGB 11.5* 10.9* 10.3*  HCT 33.8* 33.2* 31.3*  PLT 380 372 341  MCV 91.6 94.6 95.7  MCH 31.2 31.1 31.5  MCHC 34.0 32.8 32.9  RDW  13.4 14.2 14.1    Recent Labs  Lab 09/26/23 1733 09/27/23 0943 09/27/23 1435 09/28/23 0500  NA 132* 132*  --  135  K 4.0 3.8  --  4.3  CL 91* 97*  --  100  CO2 19* 20*  --  23  GLUCOSE 103* 119*  --  117*  BUN 20 7  --  7  CREATININE 0.70 0.59  --  0.55  CALCIUM 9.9 8.9  --  8.8*  AST 52* 51*  --  50*  ALT 27 30  --  28  ALKPHOS 164* 125  --  107  BILITOT 0.2 0.5  --  0.4  ALBUMIN 3.7 2.6*  --  2.2*  TSH  --   --  1.692  --     ------------------------------------------------------------------------------------------------------------------ Recent Labs    09/27/23 1016  TRIG 77    No results found for: "HGBA1C" ------------------------------------------------------------------------------------------------------------------ Recent Labs    09/27/23 1435  TSH 1.692    Cardiac Enzymes No results for input(s): "CKMB", "TROPONINI", "MYOGLOBIN" in the last 168 hours.  Invalid input(s): "CK" ------------------------------------------------------------------------------------------------------------------ No results found for: "BNP"  CBG: No results for input(s): "GLUCAP" in the last 168  hours.  No results found for this or any previous visit (from the past 240 hours).   Radiology Studies: DG CHEST PORT 1 VIEW Result Date: 09/27/2023 CLINICAL DATA:  Shortness of breath. EXAM: PORTABLE CHEST 1 VIEW COMPARISON:  July 22, 2011. FINDINGS: The heart size and mediastinal contours are within normal limits. Left lung is clear. Minimal right basilar subsegmental atelectasis is noted. The visualized skeletal structures are unremarkable. IMPRESSION: Minimal right basilar subsegmental atelectasis. Electronically Signed   By: Rosalene Colon M.D.   On: 09/27/2023 16:24     Kathlen Para, MD, PhD [facility]  Between 7 am - 7 pm I am available, please contact me via Amion (for emergencies) or Securechat (non urgent messages)  Between 7 pm - 7 am I am not available, please contact night coverage MD/APP via Amion

## 2023-09-29 DIAGNOSIS — Z560 Unemployment, unspecified: Secondary | ICD-10-CM | POA: Diagnosis not present

## 2023-09-29 DIAGNOSIS — E785 Hyperlipidemia, unspecified: Secondary | ICD-10-CM | POA: Diagnosis present

## 2023-09-29 DIAGNOSIS — Z79891 Long term (current) use of opiate analgesic: Secondary | ICD-10-CM | POA: Diagnosis not present

## 2023-09-29 DIAGNOSIS — I10 Essential (primary) hypertension: Secondary | ICD-10-CM | POA: Diagnosis present

## 2023-09-29 DIAGNOSIS — K853 Drug induced acute pancreatitis without necrosis or infection: Secondary | ICD-10-CM | POA: Diagnosis present

## 2023-09-29 DIAGNOSIS — G43E09 Chronic migraine with aura, not intractable, without status migrainosus: Secondary | ICD-10-CM | POA: Diagnosis not present

## 2023-09-29 DIAGNOSIS — Z6841 Body Mass Index (BMI) 40.0 and over, adult: Secondary | ICD-10-CM | POA: Diagnosis not present

## 2023-09-29 DIAGNOSIS — Z803 Family history of malignant neoplasm of breast: Secondary | ICD-10-CM | POA: Diagnosis not present

## 2023-09-29 DIAGNOSIS — D72829 Elevated white blood cell count, unspecified: Secondary | ICD-10-CM | POA: Diagnosis not present

## 2023-09-29 DIAGNOSIS — E66813 Obesity, class 3: Secondary | ICD-10-CM | POA: Diagnosis present

## 2023-09-29 DIAGNOSIS — Z79890 Hormone replacement therapy (postmenopausal): Secondary | ICD-10-CM | POA: Diagnosis not present

## 2023-09-29 DIAGNOSIS — B379 Candidiasis, unspecified: Secondary | ICD-10-CM | POA: Diagnosis not present

## 2023-09-29 DIAGNOSIS — F17290 Nicotine dependence, other tobacco product, uncomplicated: Secondary | ICD-10-CM | POA: Diagnosis present

## 2023-09-29 DIAGNOSIS — T398X5A Adverse effect of other nonopioid analgesics and antipyretics, not elsewhere classified, initial encounter: Secondary | ICD-10-CM | POA: Diagnosis present

## 2023-09-29 DIAGNOSIS — K567 Ileus, unspecified: Secondary | ICD-10-CM | POA: Diagnosis not present

## 2023-09-29 DIAGNOSIS — Z807 Family history of other malignant neoplasms of lymphoid, hematopoietic and related tissues: Secondary | ICD-10-CM | POA: Diagnosis not present

## 2023-09-29 DIAGNOSIS — R160 Hepatomegaly, not elsewhere classified: Secondary | ICD-10-CM | POA: Diagnosis not present

## 2023-09-29 DIAGNOSIS — R569 Unspecified convulsions: Secondary | ICD-10-CM | POA: Diagnosis not present

## 2023-09-29 DIAGNOSIS — K859 Acute pancreatitis without necrosis or infection, unspecified: Secondary | ICD-10-CM | POA: Diagnosis not present

## 2023-09-29 DIAGNOSIS — R7989 Other specified abnormal findings of blood chemistry: Secondary | ICD-10-CM | POA: Diagnosis not present

## 2023-09-29 DIAGNOSIS — E876 Hypokalemia: Secondary | ICD-10-CM | POA: Diagnosis not present

## 2023-09-29 DIAGNOSIS — R109 Unspecified abdominal pain: Secondary | ICD-10-CM | POA: Diagnosis present

## 2023-09-29 DIAGNOSIS — Z90710 Acquired absence of both cervix and uterus: Secondary | ICD-10-CM | POA: Diagnosis not present

## 2023-09-29 DIAGNOSIS — D649 Anemia, unspecified: Secondary | ICD-10-CM | POA: Diagnosis not present

## 2023-09-29 DIAGNOSIS — Z825 Family history of asthma and other chronic lower respiratory diseases: Secondary | ICD-10-CM | POA: Diagnosis not present

## 2023-09-29 DIAGNOSIS — Z800 Family history of malignant neoplasm of digestive organs: Secondary | ICD-10-CM | POA: Diagnosis not present

## 2023-09-29 DIAGNOSIS — R0600 Dyspnea, unspecified: Secondary | ICD-10-CM | POA: Diagnosis not present

## 2023-09-29 DIAGNOSIS — R1013 Epigastric pain: Secondary | ICD-10-CM | POA: Diagnosis not present

## 2023-09-29 DIAGNOSIS — E039 Hypothyroidism, unspecified: Secondary | ICD-10-CM | POA: Diagnosis present

## 2023-09-29 DIAGNOSIS — F32A Depression, unspecified: Secondary | ICD-10-CM | POA: Diagnosis present

## 2023-09-29 DIAGNOSIS — G8929 Other chronic pain: Secondary | ICD-10-CM | POA: Diagnosis not present

## 2023-09-29 DIAGNOSIS — G894 Chronic pain syndrome: Secondary | ICD-10-CM | POA: Diagnosis present

## 2023-09-29 DIAGNOSIS — G4700 Insomnia, unspecified: Secondary | ICD-10-CM | POA: Diagnosis present

## 2023-09-29 DIAGNOSIS — R110 Nausea: Secondary | ICD-10-CM | POA: Diagnosis not present

## 2023-09-29 DIAGNOSIS — G43909 Migraine, unspecified, not intractable, without status migrainosus: Secondary | ICD-10-CM | POA: Diagnosis present

## 2023-09-29 LAB — COMPREHENSIVE METABOLIC PANEL WITH GFR
ALT: 34 U/L (ref 0–44)
AST: 51 U/L — ABNORMAL HIGH (ref 15–41)
Albumin: 2.2 g/dL — ABNORMAL LOW (ref 3.5–5.0)
Alkaline Phosphatase: 116 U/L (ref 38–126)
Anion gap: 9 (ref 5–15)
BUN: <5 mg/dL — ABNORMAL LOW (ref 6–20)
CO2: 22 mmol/L (ref 22–32)
Calcium: 8.5 mg/dL — ABNORMAL LOW (ref 8.9–10.3)
Chloride: 102 mmol/L (ref 98–111)
Creatinine, Ser: 0.57 mg/dL (ref 0.44–1.00)
GFR, Estimated: >60 mL/min (ref >60)
Glucose, Bld: 132 mg/dL — ABNORMAL HIGH (ref 70–99)
Potassium: 3.9 mmol/L (ref 3.5–5.1)
Sodium: 133 mmol/L — ABNORMAL LOW (ref 135–145)
Total Bilirubin: 0.4 mg/dL (ref 0.0–1.2)
Total Protein: 5.9 g/dL — ABNORMAL LOW (ref 6.5–8.1)

## 2023-09-29 LAB — CBC
HCT: 30.2 % — ABNORMAL LOW (ref 36.0–46.0)
Hemoglobin: 9.8 g/dL — ABNORMAL LOW (ref 12.0–15.0)
MCH: 31.3 pg (ref 26.0–34.0)
MCHC: 32.5 g/dL (ref 30.0–36.0)
MCV: 96.5 fL (ref 80.0–100.0)
Platelets: 352 10*3/uL (ref 150–400)
RBC: 3.13 MIL/uL — ABNORMAL LOW (ref 3.87–5.11)
RDW: 14.1 % (ref 11.5–15.5)
WBC: 15.5 10*3/uL — ABNORMAL HIGH (ref 4.0–10.5)
nRBC: 0 % (ref 0.0–0.2)

## 2023-09-29 LAB — MAGNESIUM: Magnesium: 1.7 mg/dL (ref 1.7–2.4)

## 2023-09-29 LAB — LIPASE, BLOOD: Lipase: 92 U/L — ABNORMAL HIGH (ref 11–51)

## 2023-09-29 MED ORDER — SODIUM CHLORIDE 0.9 % IV SOLN: INTRAVENOUS | Status: AC

## 2023-09-29 NOTE — Progress Notes (Addendum)
 PROGRESS NOTE  Kendra Parker:[id number] DOB: Dec 22, 1970 DOA: 09/27/2023 PCP: Joenathan Muslim, FNP   LOS: 0 days   Brief Narrative / Interim history: 53 year old with HTN, HLD, chronic migraine headaches comes into the hospital with dull abdominal pain for the past 2 and half weeks, radiating into her back.  This has been associated with nausea, vomiting.  Given persistent symptoms presented to the ER, and a CT scan revealed acute pancreatitis with no necrosis or fluid collection.  Right upper quadrant ultrasound showed fatty liver, and gallbladder was unremarkable.  She was admitted to the hospital.  Subjective / 24h Interval events: I found patient in pretty significant abdominal pain, epigastric and left upper quadrant.  Has been trying some Jell-O and Sprite but they make her sick  Assesement and Plan: Principal Problem:   Pancreatitis Active Problems:   Dyspnea   Constipation   Essential hypertension   Sinus tachycardia   Migraine   Chronic pain   Dyslipidemia   Hypothyroidism   Insomnia   Obesity, Class III, BMI 40-49.9 (morbid obesity)   Principal problem Acute pancreatitis -continue supportive care, IV fluids, pain control.  CT scan on admission without complicating factors such as abscesses or necrosis.  There is no gallbladder pathology, no cholelithiasis or choledocholithiasis seen on imaging - She was recently started back on her Ubrelvy  for her migraines, and there are few case reports of pancreatitis associated with this. - She was also recently placed on Vyepti infusion every 3 months, but I do not see a correlation between this and pancreatitis. - Sumatriptan can be linked with pancreatitis, but she tolerated it well for quite some time - Given worsening pain, will make n.p.o. today except for sips, advised her to avoid sugary drinks  Active problems Migraine headaches-on multiple medications at home, follows with neurology as an  outpatient  Hyperlipidemia-continue statin  Hypothyroidism-continue Synthroid  Hypertension-continue metoprolol  Obesity, class III-BMI greater than 40.  She would benefit from weight loss  Scheduled Meds:  atorvastatin  20 mg Oral Daily   clonazePAM   1 mg Oral BID   DULoxetine   30 mg Oral QHS   DULoxetine   60 mg Oral QHS   enoxaparin  (LOVENOX ) injection  50 mg Subcutaneous Q24H   fenofibrate  54 mg Oral Daily   levETIRAcetam  500 mg Oral BID   levothyroxine  25 mcg Oral Daily   metoprolol tartrate  50 mg Oral BID   pregabalin  25 mg Oral Daily   QUEtiapine  200 mg Oral QHS   senna-docusate  1 tablet Oral BID   sodium chloride  flush  3 mL Intravenous Q12H   SUMAtriptan  100 mg Oral Daily   venlafaxine  XR  75 mg Oral Q breakfast   Continuous Infusions:  sodium chloride  200 mL/hr at 09/29/23 0912   PRN Meds:.acetaminophen  **OR** acetaminophen , acetaminophen  **AND** diphenhydrAMINE, albuterol , HYDROmorphone (DILAUDID) injection, methocarbamol , mouth rinse, oxyCODONE  Current Outpatient Medications  Medication Instructions   albuterol  (VENTOLIN  HFA) 108 (90 Base) MCG/ACT inhaler inhale 2 puffs into the lungs by mouth every 4 hours as needed for wheezing   atorvastatin (LIPITOR) 20 mg, Oral, Daily   celecoxib (CELEBREX) 200 mg, Oral, Daily   clonazePAM  (KLONOPIN ) 1 mg, Oral, 2 times daily   Cyanocobalamin (VITAMIN B-12 PO) 1 capsule, Oral, Daily   diphenhydramine-acetaminophen  (TYLENOL  PM) 25-500 MG TABS tablet 2 tablets, Oral, Daily at bedtime   DULoxetine  (CYMBALTA ) 30 mg, Oral, Daily at bedtime   DULoxetine  (CYMBALTA ) 60 mg, Oral, Daily  at bedtime   Eptinezumab-jjmr (VYEPTI IV) 300 mg, Intravenous, Every 3 months, VYEPTI   fenofibrate (TRICOR) 48 MG tablet 1 tablet, Oral, Daily   FIBER PO 3 tablets, Oral, 2 times daily   HYDROcodone -acetaminophen  (NORCO/VICODIN) 5-325 MG tablet 1 tablet, Oral, 3 times daily   levETIRAcetam (KEPPRA) 500 mg, Oral, 2 times daily    levothyroxine (SYNTHROID) 25 MCG tablet 1 tablet, Oral, Daily   lisinopril (ZESTRIL) 10 mg, Oral, Daily   MAGNESIUM PO 1 tablet, Oral, Daily   methocarbamol  (ROBAXIN ) 750 MG tablet Take every 8 hours to treat acute migraines. #30 allowed per month.   metoprolol tartrate (LOPRESSOR) 50 mg, Oral, 2 times daily   Multiple Vitamins-Minerals (MULTIVITAMIN WITH MINERALS) tablet 1 tablet, Oral, Daily   naloxone (NARCAN) 0.4 mg, Nasal,  Once   Nurtec 75 mg, Oral, Every other day   ondansetron  (ZOFRAN -ODT) 4 mg, Oral, Every 8 hours PRN   pregabalin (LYRICA) 25 mg, Oral, Daily   QUEtiapine (SEROQUEL) 200 mg, Oral, Daily at bedtime   rizatriptan  (MAXALT -MLT) 10 mg, Oral, As needed   spironolactone (ALDACTONE) 25 mg, Oral, Daily   SUMAtriptan (IMITREX) 100 mg, Oral, Daily   Ubrelvy  100 mg, Oral, As needed   venlafaxine  XR (EFFEXOR  XR) 37.5 mg, Oral, Daily with breakfast   Vitamin D3 2,000 Units, Oral, Daily    Diet Orders (From admission, onward)     Start     Ordered   09/29/23 0835  Diet NPO time specified Except for: Ice Chips, Sips with Meds  Diet effective now       Question Answer Comment  Except for Ice Chips   Except for Sips with Meds      09/29/23 0834            DVT prophylaxis:    Lab Results  Component Value Date   PLT 352 09/29/2023      Code Status: Full Code  Family Communication: family at bedside   Status is: Observation The patient will require care spanning > 2 midnights and should be moved to inpatient because: IV fluids, pain control, unable to take p.o.   Level of care: Telemetry Medical  Consultants:  None   Objective: Vitals:   09/28/23 2120 09/28/23 2121 09/29/23 0550 09/29/23 0753  BP: 117/63 117/63 117/68 117/69  Pulse: (!) 109 (!) 109 (!) 105 (!) 105  Resp: 18  18 18   Temp: 98.6 F (37 C)  98.6 F (37 C) 98.6 F (37 C)  TempSrc: Oral  Oral Oral  SpO2: (!) 83%  93% 96%  Weight:      Height:        Intake/Output Summary (Last 24  hours) at 09/29/2023 1011 Last data filed at 09/28/2023 1723 Gross per 24 hour  Intake 1381.16 ml  Output 0 ml  Net 1381.16 ml   Wt Readings from Last 3 Encounters:  09/27/23 110 kg  09/26/23 110.2 kg  11/28/20 105.7 kg    Examination:  Constitutional: NAD Eyes: lids and conjunctivae normal, no scleral icterus ENMT: mmm Neck: normal, supple Respiratory: clear to auscultation bilaterally, no wheezing, no crackles. Normal respiratory effort.  Cardiovascular: Regular rate and rhythm, no murmurs / rubs / gallops. No LE edema. Abdomen: Diffusely tender   Data Reviewed: I have independently reviewed following labs and imaging studies   CBC Recent Labs  Lab 09/26/23 1733 09/27/23 0943 09/28/23 0500 09/29/23 0551  WBC 7.4 9.9 11.8* 15.5*  HGB 11.5* 10.9* 10.3* 9.8*  HCT 33.8* 33.2*  31.3* 30.2*  PLT 380 372 341 352  MCV 91.6 94.6 95.7 96.5  MCH 31.2 31.1 31.5 31.3  MCHC 34.0 32.8 32.9 32.5  RDW 13.4 14.2 14.1 14.1    Recent Labs  Lab 09/26/23 1733 09/27/23 0943 09/27/23 1435 09/28/23 0500 09/29/23 0551  NA 132* 132*  --  135 133*  K 4.0 3.8  --  4.3 3.9  CL 91* 97*  --  100 102  CO2 19* 20*  --  23 22  GLUCOSE 103* 119*  --  117* 132*  BUN 20 7  --  7 <5*  CREATININE 0.70 0.59  --  0.55 0.57  CALCIUM 9.9 8.9  --  8.8* 8.5*  AST 52* 51*  --  50* 51*  ALT 27 30  --  28 34  ALKPHOS 164* 125  --  107 116  BILITOT 0.2 0.5  --  0.4 0.4  ALBUMIN 3.7 2.6*  --  2.2* 2.2*  MG  --   --   --   --  1.7  TSH  --   --  1.692  --   --     ------------------------------------------------------------------------------------------------------------------ Recent Labs    09/27/23 1016  TRIG 77    No results found for: "HGBA1C" ------------------------------------------------------------------------------------------------------------------ Recent Labs    09/27/23 1435  TSH 1.692    Cardiac Enzymes No results for input(s): "CKMB", "TROPONINI", "MYOGLOBIN" in the last  168 hours.  Invalid input(s): "CK" ------------------------------------------------------------------------------------------------------------------ No results found for: "BNP"  CBG: No results for input(s): "GLUCAP" in the last 168 hours.  No results found for this or any previous visit (from the past 240 hours).   Radiology Studies: No results found.    Kathlen Para, MD, PhD [facility]  Between 7 am - 7 pm I am available, please contact me via Amion (for emergencies) or Securechat (non urgent messages)  Between 7 pm - 7 am I am not available, please contact night coverage MD/APP via Amion

## 2023-09-29 NOTE — Plan of Care (Signed)
    Problem: Education: Goal: Knowledge of General Education information will improve Description: Including pain rating scale, medication(s)/side effects and non-pharmacologic comfort measures Outcome: Progressing   Problem: Health Behavior/Discharge Planning: Goal: Ability to manage health-related needs will improve Outcome: Progressing   Problem: Clinical Measurements: Goal: Ability to maintain clinical measurements within normal limits will improve Outcome: Progressing Goal: Will remain free from infection Outcome: Progressing Goal: Respiratory complications will improve Outcome: Progressing Goal: Cardiovascular complication will be avoided Outcome: Progressing   Problem: Coping: Goal: Level of anxiety will decrease Outcome: Progressing   Problem: Elimination: Goal: Will not experience complications related to bowel motility Outcome: Progressing   Problem: Safety: Goal: Ability to remain free from injury will improve Outcome: Progressing   Problem: Skin Integrity: Goal: Risk for impaired skin integrity will decrease Outcome: Progressing   Problem: Activity: Goal: Risk for activity intolerance will decrease Outcome: Not Progressing   Problem: Nutrition: Goal: Adequate nutrition will be maintained Outcome: Not Progressing   Problem: Pain Managment: Goal: General experience of comfort will improve and/or be controlled Outcome: Not Progressing

## 2023-09-29 NOTE — Plan of Care (Signed)
  Problem: Clinical Measurements: Goal: Ability to maintain clinical measurements within normal limits will improve Outcome: Progressing Goal: Will remain free from infection Outcome: Progressing   Problem: Activity: Goal: Risk for activity intolerance will decrease Outcome: Progressing   Problem: Pain Managment: Goal: General experience of comfort will improve and/or be controlled Outcome: Progressing   Problem: Safety: Goal: Ability to remain free from injury will improve Outcome: Progressing   Problem: Skin Integrity: Goal: Risk for impaired skin integrity will decrease Outcome: Progressing

## 2023-09-30 ENCOUNTER — Inpatient Hospital Stay (HOSPITAL_COMMUNITY)

## 2023-09-30 DIAGNOSIS — R1013 Epigastric pain: Secondary | ICD-10-CM

## 2023-09-30 DIAGNOSIS — D72829 Elevated white blood cell count, unspecified: Secondary | ICD-10-CM

## 2023-09-30 DIAGNOSIS — E876 Hypokalemia: Secondary | ICD-10-CM

## 2023-09-30 DIAGNOSIS — K859 Acute pancreatitis without necrosis or infection, unspecified: Secondary | ICD-10-CM | POA: Diagnosis not present

## 2023-09-30 DIAGNOSIS — R110 Nausea: Secondary | ICD-10-CM | POA: Diagnosis not present

## 2023-09-30 LAB — CBC
HCT: 27.2 % — ABNORMAL LOW (ref 36.0–46.0)
Hemoglobin: 8.9 g/dL — ABNORMAL LOW (ref 12.0–15.0)
MCH: 31.6 pg (ref 26.0–34.0)
MCHC: 32.7 g/dL (ref 30.0–36.0)
MCV: 96.5 fL (ref 80.0–100.0)
Platelets: 337 10*3/uL (ref 150–400)
RBC: 2.82 MIL/uL — ABNORMAL LOW (ref 3.87–5.11)
RDW: 13.8 % (ref 11.5–15.5)
WBC: 13.1 10*3/uL — ABNORMAL HIGH (ref 4.0–10.5)
nRBC: 0 % (ref 0.0–0.2)

## 2023-09-30 LAB — COMPREHENSIVE METABOLIC PANEL WITH GFR
ALT: 27 U/L (ref 0–44)
AST: 38 U/L (ref 15–41)
Albumin: 2 g/dL — ABNORMAL LOW (ref 3.5–5.0)
Alkaline Phosphatase: 103 U/L (ref 38–126)
Anion gap: 11 (ref 5–15)
BUN: <5 mg/dL — ABNORMAL LOW (ref 6–20)
CO2: 24 mmol/L (ref 22–32)
Calcium: 8.4 mg/dL — ABNORMAL LOW (ref 8.9–10.3)
Chloride: 100 mmol/L (ref 98–111)
Creatinine, Ser: 0.53 mg/dL (ref 0.44–1.00)
GFR, Estimated: >60 mL/min (ref >60)
Glucose, Bld: 114 mg/dL — ABNORMAL HIGH (ref 70–99)
Potassium: 3.9 mmol/L (ref 3.5–5.1)
Sodium: 135 mmol/L (ref 135–145)
Total Bilirubin: 0.5 mg/dL (ref 0.0–1.2)
Total Protein: 5.6 g/dL — ABNORMAL LOW (ref 6.5–8.1)

## 2023-09-30 LAB — MAGNESIUM: Magnesium: 1.7 mg/dL (ref 1.7–2.4)

## 2023-09-30 MED ORDER — POLYETHYLENE GLYCOL 3350 17 G PO PACK: 17.0000 g | PACK | Freq: Every evening | ORAL | Status: DC | PRN

## 2023-09-30 MED ORDER — PANTOPRAZOLE SODIUM 40 MG IV SOLR
40.0000 mg | Freq: Every day | INTRAVENOUS | Status: DC
  Administered 2023-09-30 – 2023-10-09 (×10): 40 mg via INTRAVENOUS
  Filled 2023-09-30 (×10): qty 10

## 2023-09-30 MED ORDER — LACTATED RINGERS IV SOLN: INTRAVENOUS | Status: AC

## 2023-09-30 MED ORDER — GADOBUTROL 1 MMOL/ML IV SOLN
10.0000 mL | Freq: Once | INTRAVENOUS | Status: AC | PRN
  Administered 2023-09-30: 10 mL via INTRAVENOUS

## 2023-09-30 NOTE — Progress Notes (Signed)
 PROGRESS NOTE  Kendra Parker ATF:[id number] DOB: [birth year] DOA: 09/27/2023 PCP: Joenathan Muslim, FNP   LOS: 1 day   Brief Narrative / Interim history: 53 year old with HTN, HLD, chronic migraine headaches comes into the hospital with dull abdominal pain for the past 2 and half weeks, radiating into her back.  This has been associated with nausea, vomiting.  Given persistent symptoms presented to the ER, and a CT scan revealed acute pancreatitis with no necrosis or fluid collection.  Right upper quadrant ultrasound showed fatty liver, and gallbladder was unremarkable.  She was admitted to the hospital.  Subjective / 24h Interval events: Continues to complain of significant pain today.  Mainly epigastric.  Also has a migraine but tells me that is "bearable".  Assesement and Plan: Principal Problem:   Pancreatitis Active Problems:   Dyspnea   Constipation   Essential hypertension   Sinus tachycardia   Migraine   Chronic pain   Dyslipidemia   Hypothyroidism   Insomnia   Obesity, Class III, BMI 40-49.9 (morbid obesity)   Principal problem Acute pancreatitis -continue supportive care, IV fluids, pain control.  CT scan on admission without complicating factors such as abscesses or necrosis.  There is no gallbladder pathology, no cholelithiasis or choledocholithiasis seen on imaging - She was recently started back on her Ubrelvy  for her migraines, and there are few case reports of pancreatitis associated with this. - She was also recently placed on Vyepti infusion every 3 months, but I do not see a correlation between this and pancreatitis. - Sumatriptan  can be linked with pancreatitis, but she tolerated it well for quite some time, but now I am holding this given lack of improvement - She is not improving despite n.p.o., IV fluids.  GI consulted today, appreciate input.  Will obtain MRI/MRCP  Active problems Migraine headaches-on multiple medications at home, follows with neurology  as an outpatient  Hyperlipidemia-continue statin  Hypothyroidism-continue Synthroid   Hypertension-continue metoprolol   Obesity, class III-BMI greater than 40.  She would benefit from weight loss  Scheduled Meds:  atorvastatin   20 mg Oral Daily   clonazePAM   1 mg Oral BID   DULoxetine   30 mg Oral QHS   DULoxetine   60 mg Oral QHS   enoxaparin  (LOVENOX ) injection  50 mg Subcutaneous Q24H   fenofibrate   54 mg Oral Daily   levETIRAcetam   500 mg Oral BID   levothyroxine   25 mcg Oral Daily   metoprolol  tartrate  50 mg Oral BID   pregabalin   25 mg Oral Daily   QUEtiapine   200 mg Oral QHS   senna-docusate  1 tablet Oral BID   sodium chloride  flush  3 mL Intravenous Q12H   venlafaxine  XR  75 mg Oral Q breakfast   Continuous Infusions:   PRN Meds:.acetaminophen  **OR** acetaminophen , acetaminophen  **AND** diphenhydrAMINE , albuterol , HYDROmorphone  (DILAUDID ) injection, methocarbamol , mouth rinse, oxyCODONE   Current Outpatient Medications  Medication Instructions   albuterol  (VENTOLIN  HFA) 108 (90 Base) MCG/ACT inhaler inhale 2 puffs into the lungs by mouth every 4 hours as needed for wheezing   atorvastatin  (LIPITOR) 20 mg, Oral, Daily   celecoxib (CELEBREX) 200 mg, Oral, Daily   clonazePAM  (KLONOPIN ) 1 mg, Oral, 2 times daily   Cyanocobalamin (VITAMIN B-12 PO) 1 capsule, Oral, Daily   diphenhydramine -acetaminophen  (TYLENOL  PM) 25-500 MG TABS tablet 2 tablets, Oral, Daily at bedtime   DULoxetine  (CYMBALTA ) 30 mg, Oral, Daily at bedtime   DULoxetine  (CYMBALTA ) 60 mg, Oral, Daily at bedtime   Eptinezumab-jjmr (VYEPTI IV) 300 mg,  Intravenous, Every 3 months, VYEPTI   fenofibrate  (TRICOR ) 48 MG tablet 1 tablet, Oral, Daily   FIBER PO 3 tablets, Oral, 2 times daily   HYDROcodone -acetaminophen  (NORCO/VICODIN) 5-325 MG tablet 1 tablet, Oral, 3 times daily   levETIRAcetam  (KEPPRA ) 500 mg, Oral, 2 times daily   levothyroxine  (SYNTHROID ) 25 MCG tablet 1 tablet, Oral, Daily   lisinopril  (ZESTRIL) 10 mg, Oral, Daily   MAGNESIUM PO 1 tablet, Oral, Daily   methocarbamol  (ROBAXIN ) 750 MG tablet Take every 8 hours to treat acute migraines. #30 allowed per month.   metoprolol  tartrate (LOPRESSOR ) 50 mg, Oral, 2 times daily   Multiple Vitamins-Minerals (MULTIVITAMIN WITH MINERALS) tablet 1 tablet, Oral, Daily   naloxone (NARCAN) 0.4 mg, Nasal,  Once   Nurtec 75 mg, Oral, Every other day   ondansetron  (ZOFRAN -ODT) 4 mg, Oral, Every 8 hours PRN   pregabalin  (LYRICA ) 25 mg, Oral, Daily   QUEtiapine  (SEROQUEL ) 200 mg, Oral, Daily at bedtime   rizatriptan  (MAXALT -MLT) 10 mg, Oral, As needed   spironolactone (ALDACTONE) 25 mg, Oral, Daily   SUMAtriptan  (IMITREX ) 100 mg, Oral, Daily   Ubrelvy  100 mg, Oral, As needed   venlafaxine  XR (EFFEXOR  XR) 37.5 mg, Oral, Daily with breakfast   Vitamin D3 2,000 Units, Oral, Daily    Diet Orders (From admission, onward)     Start     Ordered   09/29/23 0835  Diet NPO time specified Except for: Ice Chips, Sips with Meds  Diet effective now       Question Answer Comment  Except for Ice Chips   Except for Sips with Meds      09/29/23 0834            DVT prophylaxis:    Lab Results  Component Value Date   PLT 337 09/30/2023      Code Status: Full Code  Family Communication: family at bedside   Status is: Inpatient   Level of care: Telemetry Medical  Consultants:  None   Objective: Vitals:   09/29/23 1534 09/29/23 2002 09/30/23 0503 09/30/23 0829  BP: 132/71 (!) 130/116 (!) 116/59 129/76  Pulse: (!) 108 (!) 110 (!) 105 (!) 107  Resp: 19 18 18 18   Temp: 98.9 F (37.2 C) 99.1 F (37.3 C) 100.2 F (37.9 C) 99.8 F (37.7 C)  TempSrc: Oral Oral Oral Oral  SpO2: 95% 95% 91% 94%  Weight:      Height:        Intake/Output Summary (Last 24 hours) at 09/30/2023 1022 Last data filed at 09/29/2023 2300 Gross per 24 hour  Intake 2441.33 ml  Output --  Net 2441.33 ml   Wt Readings from Last 3 Encounters:  09/27/23 110  kg  09/26/23 110.2 kg  11/28/20 105.7 kg    Examination:  Constitutional: NAD Eyes: lids and conjunctivae normal, no scleral icterus ENMT: mmm Neck: normal, supple Respiratory: clear to auscultation bilaterally, no wheezing, no crackles.  Cardiovascular: Regular rate and rhythm, no murmurs / rubs / gallops.  Abdomen: Epigastric tenderness present   Data Reviewed: I have independently reviewed following labs and imaging studies   CBC Recent Labs  Lab 09/26/23 1733 09/27/23 0943 09/28/23 0500 09/29/23 0551 09/30/23 0642  WBC 7.4 9.9 11.8* 15.5* 13.1*  HGB 11.5* 10.9* 10.3* 9.8* 8.9*  HCT 33.8* 33.2* 31.3* 30.2* 27.2*  PLT 380 372 341 352 337  MCV 91.6 94.6 95.7 96.5 96.5  MCH 31.2 31.1 31.5 31.3 31.6  MCHC 34.0 32.8 32.9 32.5  32.7  RDW 13.4 14.2 14.1 14.1 13.8    Recent Labs  Lab 09/26/23 1733 09/27/23 0943 09/27/23 1435 09/28/23 0500 09/29/23 0551 09/30/23 0642  NA 132* 132*  --  135 133* 135  K 4.0 3.8  --  4.3 3.9 3.9  CL 91* 97*  --  100 102 100  CO2 19* 20*  --  23 22 24   GLUCOSE 103* 119*  --  117* 132* 114*  BUN 20 7  --  7 <5* <5*  CREATININE 0.70 0.59  --  0.55 0.57 0.53  CALCIUM  9.9 8.9  --  8.8* 8.5* 8.4*  AST 52* 51*  --  50* 51* 38  ALT 27 30  --  28 34 27  ALKPHOS 164* 125  --  107 116 103  BILITOT 0.2 0.5  --  0.4 0.4 0.5  ALBUMIN 3.7 2.6*  --  2.2* 2.2* 2.0*  MG  --   --   --   --  1.7 1.7  TSH  --   --  1.692  --   --   --     ------------------------------------------------------------------------------------------------------------------ No results for input(s): "CHOL", "HDL", "LDLCALC", "TRIG", "CHOLHDL", "LDLDIRECT" in the last 72 hours.   No results found for: "HGBA1C" ------------------------------------------------------------------------------------------------------------------ Recent Labs    09/27/23 1435  TSH 1.692    Cardiac Enzymes No results for input(s): "CKMB", "TROPONINI", "MYOGLOBIN" in the last 168  hours.  Invalid input(s): "CK" ------------------------------------------------------------------------------------------------------------------ No results found for: "BNP"  CBG: No results for input(s): "GLUCAP" in the last 168 hours.  No results found for this or any previous visit (from the past 240 hours).   Radiology Studies: No results found.    Kathlen Para, MD, PhD [facility]  Between 7 am - 7 pm I am available, please contact me via Amion (for emergencies) or Securechat (non urgent messages)  Between 7 pm - 7 am I am not available, please contact night coverage MD/APP via Amion

## 2023-09-30 NOTE — Consult Note (Addendum)
 Attending physician's note   I have taken a history, reviewed the chart, and examined the patient. I performed a substantive portion of this encounter, including complete performance of at least one of the key components, in conjunction with the APP. I agree with the APP's note, impression, and recommendations with my edits.   53 year old female with medical history as outlined below, to include history of migraines, HTN, HLD, obesity admitted with acute pancreatitis.  No prior history of pancreatitis or other pancreatic or hepatobiliary disease.  Aside from a grandparent with diabetes, no family history of pancreatic disease.  Abdominal pain and nausea/vomiting started about 2 weeks prior to arrival.  She was admitted on 09/27/2023, treated with IV fluids, pain control, supportive care.  She reports having some improvement the following day and tried clear liquids, but immediate recurrence of abdominal pain.  Has not had much of an appetite since then and continued abdominal pain.  MRCP today with markedly increased peripancreatic edema with ill-defined fluid signal in pancreatic neck and caudal to the pancreatic body/tail junction without pancreatic necrosis or PD dilation.  1) Acute pancreatitis 2) Epigastric pain 3) Nausea 4) Leukocytosis Not a clear etiology for her acute pancreatitis.  Medications reviewed with her today and were reviewed in depth by her primary Hospitalist without clear etiology.  Imaging otherwise with normal gallbladder and no PD stone or PD dilation noted on today's MRCP.  Lipase has improved since admission, and otherwise no hemoconcentration or renal dysfunction.  Liver enzymes, alkaline phosphatase, bilirubin normal. - Given continued pain and nausea, will restart LR at 250 cc/hour this evening and overnight.  Discussed possibility of volume overload that could require diuresis - If no meaningful p.o. intake in the coming days, may need to consider enteral feeding  tube - Daily CBC and CMP - Antiemetics and pain control per primary service - Checking IgG4 - Will need repeat CT in 1 week to reevaluate pancreas.  May eventually require outpatient EUS if no clear etiology - CXR in the morning  5) Constipation Likely related to combination of the acute pancreatitis and pain medication (takes hydrocodone /Cymbalta  as outpatient, now on inpatient pain meds as well) - Starting bowel regimen  GI service will continue to follow  Kendra Parker [phone] office          Referring Provider: Dr. Kathlen Para Primary Care Physician:  Joenathan Muslim, FNP Primary Gastroenterologist: Para Bold  Reason for Consultation: Pancreatitis  HPI: Kendra Parker is a 53 y.o. female with a past medical history of anxiety, migraine headaches, hypertension, hyperlipidemia, chronic pain and morbid obesity. She was seen by her PCP in office 09/26/2023 due to having left sided abdominal pain with N/V daily x 1 1/2 weeks. No fevers. She noted having SOB and she was tachycardic at that time therefore she was sent to the ED for further evaluation.  Labs in the ED showed a WBC count of 7.4.  Hemoglobin 11.5.  Hematocrit 33.8.  Platelet 380.  Sodium 132.  Potassium 4.0.  BUN 20.  Creatinine 0.70.  Total bili 0.2.  Alk phos 164.  AST 52.  ALT 27.  Lipase 149.  Lipase 98.  Calcium  9.9.  Chest CT negative for PE.  CTAP with contrast showed acute interstitial edematous pancreatitis without evidence of pancreatic necrosis or fluid collection and a moderate amount of stool in the colon.  RUQ sonogram showed a normal gallbladder without gallstones or biliary ductal dilatation and evidence of hepatic steatosis  noted.  Labs 09/27/2023: WBC 9.9.  Hemoglobin 10.9.  Platelet 372.  Total bili 0.5.  Alk phos 125.  AST 51.  ALT 30.  Triglycerides 77.  TSH 1.692.  HIV nonreactive.  Chest x-ray showed minimal right basilar subsegmental atelectasis.  Labs 09/28/2023: WBC 11.8.   Hemoglobin 10.3.  Total bili 0.4.  Alk phos 107.  AST 50.  ALT 20.  Labs 09/29/2023: WBC 15.5.  Hemoglobin 9.8.    Total bili 0.4.  Alk phos 116.  AST 51.  ALT 34.  Lipase 92.  Today's labs:     WBC 13.1.  Hemoglobin 8.9.    Total bili 0.5.  Alk phos 103.  AST 38.  ALT 27.  Received IV fluids normal saline at 200 cc an hour x 24 hours 5/8  Abdominal MRI/MRCP completed earlier today showed markedly increased moderate peripancreatic edema with areas of ill-defined fluid signal seen caudal to the pancreatic neck at 3.2 x 2.7 cm and caudal to the pancreatic body/tail junction at 2.8 x 1.2 cm without evidence of pancreatic necrosis or PD dilatation.  A GI consult was requested for further evaluation regarding CT and MRI imaging concerning for acute pancreatitis.   She continues to have generalized abdominal pain which is worse to the LUQ/left flank and LLQ. She denies having any GERD symptoms or dysphagia. She takes Advil 2 tabs weekly for headaches. Her left-sided abdominal pain and N/V started about 2 weeks ago. She stated anytime she ate she immediately vomited up the food she had just eaten.  No specific food triggers. No coffee-ground or hematemesis. She also noted going at least 1 week without passing a bowel movement and took several doses of MiraLAX which was ineffective. She received an enema 2 days ago and stated passing a moderate amount of nonbloody loose stool. No bloody or black stool. No prior history of pancreatitis. Her newest medication Vyepti infusion for migraine headaches was started 6 months ago, last infusion was 09/16/2023. No known family history of pancreatic disease. Father was diagnosed with colon cancer at the age of 83. She denies ever having an EGD or screening colonoscopy.  Past Medical History:  Diagnosis Date   Anxiety    Hypertension    Migraine    Numbness    Left leg   Pelvic pain     Past Surgical History:  Procedure Laterality Date   ABDOMINAL HYSTERECTOMY      Partial   INNER EAR SURGERY     LAPAROSCOPIC OVARIAN CYSTECTOMY     Left    Prior to Admission medications   Medication Sig Start Date End Date Taking? Authorizing Provider  albuterol  (VENTOLIN  HFA) 108 (90 Base) MCG/ACT inhaler inhale 2 puffs into the lungs by mouth every 4 hours as needed for wheezing 11/17/20  Yes [provider]  atorvastatin  (LIPITOR) 20 MG tablet Take 20 mg by mouth daily.   Yes [provider]  celecoxib (CELEBREX) 200 MG capsule Take 200 mg by mouth daily.   Yes [provider]  Cholecalciferol (VITAMIN D3) 2000 UNITS TABS Take 2,000 Units by mouth daily.    Yes [provider]  clonazePAM  (KLONOPIN ) 0.5 MG tablet Take 1 mg by mouth 2 (two) times daily.   Yes [provider]  Cyanocobalamin (VITAMIN B-12 PO) Take 1 capsule by mouth daily.   Yes [provider]  diphenhydramine -acetaminophen  (TYLENOL  PM) 25-500 MG TABS tablet Take 2 tablets by mouth at bedtime.   Yes [provider]  DULoxetine  (  CYMBALTA ) 30 MG capsule Take 30 mg by mouth at bedtime. 03/22/22  Yes [provider]  DULoxetine  (CYMBALTA ) 60 MG capsule Take 60 mg by mouth at bedtime. 11/17/20  Yes [provider]  Eptinezumab-jjmr (VYEPTI IV) Inject 300 mg into the vein every 3 (three) months. VYEPTI   Yes [provider]  fenofibrate  (TRICOR ) 48 MG tablet Take 1 tablet by mouth daily. 03/22/22  Yes [provider]  FIBER PO Take 3 tablets by mouth in the morning and at bedtime.   Yes [provider]  HYDROcodone -acetaminophen  (NORCO/VICODIN) 5-325 MG tablet Take 1 tablet by mouth in the morning, at noon, and at bedtime.   Yes [provider]  levETIRAcetam  (KEPPRA ) 250 MG tablet Take 500 mg by mouth 2 (two) times daily.   Yes [provider]  levothyroxine  (SYNTHROID ) 25 MCG tablet Take 1 tablet by mouth daily. 06/28/22  Yes [provider]  lisinopril (PRINIVIL,ZESTRIL) 10  MG tablet Take 10 mg by mouth daily.  08/22/17  Yes [provider]  MAGNESIUM PO Take 1 tablet by mouth daily.   Yes [provider]  methocarbamol  (ROBAXIN ) 750 MG tablet Take every 8 hours to treat acute migraines. #30 allowed per month. Patient taking differently: Take 750 mg by mouth 3 (three) times daily. 02/20/21  Yes Phebe Brasil, MD  metoprolol  (LOPRESSOR ) 50 MG tablet Take 50 mg by mouth 2 (two) times daily.  12/14/14  Yes [provider]  Multiple Vitamins-Minerals (MULTIVITAMIN WITH MINERALS) tablet Take 1 tablet by mouth daily.   Yes [provider]  naloxone (NARCAN) nasal spray 4 mg/0.1 mL Place 0.4 mg into the nose once.   Yes [provider]  NURTEC 75 MG TBDP Take 75 mg by mouth every other day.   Yes [provider]  ondansetron  (ZOFRAN -ODT) 4 MG disintegrating tablet Take 1 tablet (4 mg total) by mouth every 8 (eight) hours as needed. Patient taking differently: Take 4 mg by mouth daily as needed for nausea or vomiting. 09/26/23  Yes Small, Brooke L, PA  pregabalin  (LYRICA ) 25 MG capsule Take 25 mg by mouth daily.   Yes [provider]  QUEtiapine  (SEROQUEL ) 200 MG tablet Take 200 mg by mouth at bedtime.   Yes [provider]  rizatriptan  (MAXALT -MLT) 10 MG disintegrating tablet Take 10 mg by mouth as needed for migraine.   Yes [provider]  spironolactone (ALDACTONE) 25 MG tablet Take 25 mg by mouth daily.   Yes [provider]  SUMAtriptan  (IMITREX ) 100 MG tablet Take 100 mg by mouth daily.   Yes [provider]  Ubrogepant  (UBRELVY ) 100 MG TABS Take 100 mg by mouth as needed (migraines).   Yes [provider]  venlafaxine  XR (EFFEXOR  XR) 37.5 MG 24 hr capsule Take 1 capsule (37.5 mg total) by mouth daily with breakfast. Patient taking differently: Take 75 mg by mouth daily with breakfast. 07/10/20  Yes Wess Hammed, NP    Current Facility-Administered Medications   Medication Dose Route Frequency Provider Last Rate Last Admin   acetaminophen  (TYLENOL ) tablet 650 mg  650 mg Oral Q6H PRN Smith, Rondell A, MD       Or   acetaminophen  (TYLENOL ) suppository 650 mg  650 mg Rectal Q6H PRN Manny Sees A, MD       acetaminophen  (TYLENOL ) tablet 500 mg  500 mg Oral QHS PRN Manny Sees A, MD       And   diphenhydrAMINE  (BENADRYL ) capsule 25  mg  25 mg Oral QHS PRN Smith, Rondell A, MD       albuterol  (PROVENTIL ) (2.5 MG/3ML) 0.083% nebulizer solution 2.5 mg  2.5 mg Nebulization Q6H PRN Smith, Rondell A, MD       atorvastatin  (LIPITOR) tablet 20 mg  20 mg Oral Daily Smith, Rondell A, MD   20 mg at 09/30/23 9604   clonazePAM  (KLONOPIN ) tablet 1 mg  1 mg Oral BID Smith, Rondell A, MD   1 mg at 09/30/23 5409   DULoxetine  (CYMBALTA ) DR capsule 30 mg  30 mg Oral QHS Smith, Rondell A, MD   30 mg at 09/29/23 2211   DULoxetine  (CYMBALTA ) DR capsule 60 mg  60 mg Oral QHS Smith, Rondell A, MD   60 mg at 09/29/23 2211   enoxaparin  (LOVENOX ) injection 50 mg  50 mg Subcutaneous Q24H Smith, Rondell A, MD   50 mg at 09/29/23 1224   fenofibrate  tablet 54 mg  54 mg Oral Daily Smith, Rondell A, MD   54 mg at 09/30/23 0854   HYDROmorphone  (DILAUDID ) injection 1 mg  1 mg Intravenous Q3H PRN Smith, Rondell A, MD   1 mg at 09/30/23 0856   levETIRAcetam  (KEPPRA ) tablet 500 mg  500 mg Oral BID Smith, Rondell A, MD   500 mg at 09/30/23 8119   levothyroxine  (SYNTHROID ) tablet 25 mcg  25 mcg Oral Daily Smith, Rondell A, MD   25 mcg at 09/30/23 0645   methocarbamol  (ROBAXIN ) tablet 750 mg  750 mg Oral Q8H PRN Smith, Rondell A, MD   750 mg at 09/30/23 1478   metoprolol  tartrate (LOPRESSOR ) tablet 50 mg  50 mg Oral BID Smith, Rondell A, MD   50 mg at 09/30/23 2956   Oral care mouth rinse  15 mL Mouth Rinse PRN Manny Sees A, MD       oxyCODONE  (Oxy IR/ROXICODONE ) immediate release tablet 5 mg  5 mg Oral Q4H PRN Smith, Rondell A, MD   5 mg at 09/30/23 1048   pregabalin  (LYRICA ) capsule 25  mg  25 mg Oral Daily Smith, Rondell A, MD   25 mg at 09/30/23 2130   QUEtiapine  (SEROQUEL ) tablet 200 mg  200 mg Oral QHS Smith, Rondell A, MD   200 mg at 09/29/23 2212   senna-docusate (Senokot-S) tablet 1 tablet  1 tablet Oral BID Manny Sees A, MD   1 tablet at 09/30/23 8657   sodium chloride  flush (NS) 0.9 % injection 3 mL  3 mL Intravenous Q12H Smith, Rondell A, MD   3 mL at 09/30/23 0855   venlafaxine  XR (EFFEXOR -XR) 24 hr capsule 75 mg  75 mg Oral Q breakfast Manny Sees A, MD   75 mg at 09/30/23 0853    Allergies as of 09/27/2023 - Review Complete 09/27/2023  Allergen Reaction Noted   Lamotrigine Rash 01/01/2015   Topiramate Other (See Comments) 03/07/2023   Penicillins Rash 07/22/2011    Family History  Problem Relation Age of Onset   Multiple myeloma Mother    Breast cancer Mother    Emphysema Father     Social History   Socioeconomic History   Marital status: Married    Spouse name: Not on file   Number of children: 1   Years of education: HS   Highest education level: Not on file  Occupational History   Occupation: Unemployed  Tobacco Use   Smoking status: Former    Types: Cigarettes, E-cigarettes   Smokeless tobacco: Never   Tobacco comments:  Smokes 0.25 packs/week - uses vapor  Vaping Use   Vaping status: Every Day  Substance and Sexual Activity   Alcohol use: No    Alcohol/week: 0.0 standard drinks of alcohol   Drug use: No   Sexual activity: Yes    Birth control/protection: Surgical  Other Topics Concern   Not on file  Social History Narrative   Lives at home with her husband.   Left-handed.   24 oz caffeine per day.   Social Drivers of Corporate investment banker Strain: Not on file  Food Insecurity: No Food Insecurity (09/27/2023)   Hunger Vital Sign    Worried About Running Out of Food in the Last Year: Never true    Ran Out of Food in the Last Year: Never true  Transportation Needs: No Transportation Needs (09/27/2023)   PRAPARE -  Administrator, Civil Service (Medical): No    Lack of Transportation (Non-Medical): No  Physical Activity: Not on file  Stress: Not on file  Social Connections: Not on file  Intimate Partner Violence: Not At Risk (09/27/2023)   Humiliation, Afraid, Rape, and Kick questionnaire    Fear of Current or Ex-Partner: No    Emotionally Abused: No    Physically Abused: No    Sexually Abused: No   Review of Systems: Gen: Denies fever, sweats or chills. No weight loss.  CV: Denies chest pain, palpitations or edema. Resp: Denies cough, shortness of breath of hemoptysis.  GI: See HPI. GU : Denies urinary burning, blood in urine, increased urinary frequency or incontinence. MS: Denies joint pain, muscles aches or weakness. Derm: Denies rash, itchiness, skin lesions or unhealing ulcers. Psych: Denies depression, anxiety, memory loss or confusion. Heme: Denies easy bruising, bleeding. Neuro:  Denies headaches, dizziness or paresthesias. Endo:  Denies any problems with DM, thyroid or adrenal function.  Physical Exam: Vital signs in last 24 hours: Temp:  [98.9 F (37.2 C)-100.2 F (37.9 C)] 99.8 F (37.7 C) (05/09 0829) Pulse Rate:  [105-110] 107 (05/09 0829) Resp:  [18-19] 18 (05/09 0829) BP: (116-132)/(59-116) 129/76 (05/09 0829) SpO2:  [91 %-95 %] 94 % (05/09 0829) Last BM Date : 09/28/23 General:  Alert obese 53 year old female in no acute distress. Head:  Normocephalic and atraumatic. Eyes:  No scleral icterus.Conjunctiva pink. Ears:  Normal auditory acuity. Nose:  No deformity, discharge or lesions. Mouth: Upper dentures. No ulcers or lesions.  Neck:  Supple. No lymphadenopathy or thyromegaly.  Lungs: Breath sounds clear throughout. No wheezes, rhonchi or crackles.  Heart: Regular rate and rhythm, no murmurs. Abdomen: Soft, no obvious distention in setting of obese abdomen. Generalized tenderness except RLQ was nontender, no rebound or guarding.  No palpable mass.   Positive bowel sounds all 4 quadrants. Rectal: Deferred. Musculoskeletal:  Symmetrical without gross deformities.  Pulses:  Normal pulses noted. Extremities:  Without clubbing or edema. Neurologic:  Alert and  oriented x 4. No focal deficits.  Skin:  Intact without significant lesions or rashes. Psych:  Alert and cooperative. Normal mood and affect.  Intake/Output from previous day: 05/08 0701 - 05/09 0700 In: 2561.3 [P.O.:120; I.V.:2441.3] Out: -  Intake/Output this shift: No intake/output data recorded.  Lab Results: Recent Labs    09/28/23 0500 09/29/23 0551 09/30/23 0642  WBC 11.8* 15.5* 13.1*  HGB 10.3* 9.8* 8.9*  HCT 31.3* 30.2* 27.2*  PLT 341 352 337   BMET Recent Labs    09/28/23 0500 09/29/23 0551 09/30/23 0642  NA 135 133* 135  K 4.3 3.9 3.9  CL 100 102 100  CO2 23 22 24   GLUCOSE 117* 132* 114*  BUN 7 <5* <5*  CREATININE 0.55 0.57 0.53  CALCIUM  8.8* 8.5* 8.4*   LFT Recent Labs    09/30/23 0642  PROT 5.6*  ALBUMIN 2.0*  AST 38  ALT 27  ALKPHOS 103  BILITOT 0.5   PT/INR No results for input(s): "LABPROT", "INR" in the last 72 hours. Hepatitis Panel No results for input(s): "HEPBSAG", "HCVAB", "HEPAIGM", "HEPBIGM" in the last 72 hours.  Studies/Results: No results found.  IMPRESSION/PLAN:   53 year old female with N/V and left sided abdominal pain x 1 1/2 - 2 weeks. CTAP with contrast 5/5 showed acute interstitial edematous pancreatitis without evidence of pancreatic necrosis or fluid collection and showed a normal liver and gallbladder without biliary ductal dilatation. RUQ sonogram showed a normal gallbladder without gallstones or biliary ductal dilatation and hepatic steatosis noted. Abdominal MRI/MRCP today showed markedly increased moderate peripancreatic edema with areas of ill-defined fluid signal seen caudal to the pancreatic neck measuring at 3.2 x 2.7 cm on and caudal to the pancreatic body/tail junction 2.8 x 1.2 cm without evidence of  pancreatic necrosis or PD dilatation. Alk Phos 164 -> 103. AST 52 -> 38. ALT 27. Lipase 149 -> 98 -> 92. Normal calcium  and triglyceride levels. WBC 11.8 -> 13.1. Etiology of pancreatitis remains unclear.  - Clear liquid diet, change to NPO if not tolerated  - Pain management per the hospitalist - Aggressive IV hydration, LR @ 250 cc/hr x 24 hours - Pantoprazole 40 mg IV daily - Ondansetron  4 mg PR IV every 6 hours as needed - CBC, CMP, lipase and IgG 4 level in am - Await further recommendations per Dr. Karene Oto  Shortness of breath. Chest CT negative for PE. -Management per the hospitalist   Change in bowel pattern, constipation.  Patient passed a large amount of loose stool after she received a SMOG enema during this hospital admission. Chronic opioid use likely contributing to constipation. CTAP 5/5 showed a normal colon without mass or obstruction. -Miralax Q HS  Chronic neck and shoulder pain on Hydrocodone  and Cymbalta  followed by pain management  Family history of colon cancer in first-degree relative (father) diagnosed at the age of 68 - Eventual colonoscopy after pancreatitis resolves as an outpatient   Tory Freiberg  09/30/2023, 3:10 PM

## 2023-09-30 NOTE — Plan of Care (Signed)

## 2023-10-01 ENCOUNTER — Inpatient Hospital Stay (HOSPITAL_COMMUNITY)

## 2023-10-01 DIAGNOSIS — E876 Hypokalemia: Secondary | ICD-10-CM | POA: Diagnosis not present

## 2023-10-01 DIAGNOSIS — K859 Acute pancreatitis without necrosis or infection, unspecified: Secondary | ICD-10-CM | POA: Diagnosis not present

## 2023-10-01 LAB — COMPREHENSIVE METABOLIC PANEL WITH GFR
ALT: 24 U/L (ref 0–44)
AST: 33 U/L (ref 15–41)
Albumin: 2 g/dL — ABNORMAL LOW (ref 3.5–5.0)
Alkaline Phosphatase: 95 U/L (ref 38–126)
Anion gap: 13 (ref 5–15)
BUN: <5 mg/dL — ABNORMAL LOW (ref 6–20)
CO2: 23 mmol/L (ref 22–32)
Calcium: 8.3 mg/dL — ABNORMAL LOW (ref 8.9–10.3)
Chloride: 100 mmol/L (ref 98–111)
Creatinine, Ser: 0.49 mg/dL (ref 0.44–1.00)
GFR, Estimated: >60 mL/min (ref >60)
Glucose, Bld: 116 mg/dL — ABNORMAL HIGH (ref 70–99)
Potassium: 3.4 mmol/L — ABNORMAL LOW (ref 3.5–5.1)
Sodium: 136 mmol/L (ref 135–145)
Total Bilirubin: 0.6 mg/dL (ref 0.0–1.2)
Total Protein: 5.8 g/dL — ABNORMAL LOW (ref 6.5–8.1)

## 2023-10-01 LAB — CBC
HCT: 26.7 % — ABNORMAL LOW (ref 36.0–46.0)
Hemoglobin: 8.8 g/dL — ABNORMAL LOW (ref 12.0–15.0)
MCH: 31.5 pg (ref 26.0–34.0)
MCHC: 33 g/dL (ref 30.0–36.0)
MCV: 95.7 fL (ref 80.0–100.0)
Platelets: 394 10*3/uL (ref 150–400)
RBC: 2.79 MIL/uL — ABNORMAL LOW (ref 3.87–5.11)
RDW: 13.7 % (ref 11.5–15.5)
WBC: 10.5 10*3/uL (ref 4.0–10.5)
nRBC: 0 % (ref 0.0–0.2)

## 2023-10-01 LAB — LIPASE, BLOOD: Lipase: 78 U/L — ABNORMAL HIGH (ref 11–51)

## 2023-10-01 MED ORDER — POLYETHYLENE GLYCOL 3350 17 G PO PACK
17.0000 g | PACK | Freq: Two times a day (BID) | ORAL | Status: DC
  Administered 2023-10-02 – 2023-10-12 (×16): 17 g via ORAL
  Filled 2023-10-01 (×17): qty 1

## 2023-10-01 MED ORDER — POLYETHYLENE GLYCOL 3350 17 G PO PACK: 17.0000 g | PACK | Freq: Every day | ORAL | Status: DC

## 2023-10-01 MED ORDER — BISACODYL 10 MG RE SUPP: 10.0000 mg | Freq: Every day | RECTAL | Status: DC | PRN

## 2023-10-01 NOTE — Progress Notes (Addendum)
 Patient ID: Kendra Parker, female   DOB: 01-27-1971, 53 y.o.   MRN: [id number]     Attending physician's note   I have taken a history, reviewed the chart, and examined the patient. I performed a substantive portion of this encounter, including complete performance of at least one of the key components, in conjunction with the APP. I agree with the APP's note, impression, and recommendations with my edits.   Still with abdominal pain and not much appetite.  Does want to try some clear liquids again.  IV fluids were increased yesterday, but did have a leaking IV, send not entirely clear how much of the LR she received.  Lipase downtrending at 78.  Normal renal function, liver enzymes, no hemoconcentration.  Leukocytosis has resolved.  IgG4 pending.  - Continue LR at 250 today with plan to reduce tomorrow - Trial clears - Will stop Lipitor and fibrate as potential continued offending agents - Scheduled bowel regimen - GI service will continue to follow  Raseel Jans, DO, FACG [phone] office         Progress Note   Subjective   Day # 4 CC; acute pancreatitis  Tmax 99 8  No new labs Labs 09/30/2023 WBC 13.1/hemoglobin 8.9/hematocrit 27.2 Lipase 92 Sodium 135/potassium 3.9/BUN 5/creatinine 0.53/albumin 2 LFTs within normal limits IgG4 pending  MRI/MRCP yesterday-markedly increased moderate peripancreatic edema with areas of relatively ill-defined fluid signal seen caudal to the pancreatic neck measuring 3.2 x 2.2 cm and caudal to the pancreatic body tail junction 2.8 x 1.2 cm, no evidence of pancreatic necrosis, no ductal dilation.  Consider reimaging in 5 to 7 days to determine if developing pseudocysts No choledocholithiasis no ductal dilation, no gallstones  Patient is still having quite a bit of pain, she says that Dilaudid  does help but pain level is still 7 out of 10 and constant, no nausea or vomiting, denies shortness of breath Has not had a bowel movement for several  days   Objective   Vital signs in last 24 hours: Temp:  [98.2 F (36.8 C)-99.8 F (37.7 C)] 98.2 F (36.8 C) (05/10 0817) Pulse Rate:  [102-110] 110 (05/10 0817) Resp:  [18-19] 18 (05/10 0817) BP: (129-156)/(59-84) 151/79 (05/10 0817) SpO2:  [94 %-100 %] 95 % (05/10 0817) Last BM Date : 09/28/23 General: Older white female in NAD-very uncomfortable appearing Heart: Tachycardic, regular rate and rhythm; no murmurs Lungs: Respirations even and unlabored, decreased breath sounds at the bases Abdomen: Obese, soft, she is tender across the upper abdomen with some guarding no rebound bowel sounds are present Extremities:  Without edema. Neurologic:  Alert and oriented,  grossly normal neurologically. Psych:  Cooperative. Normal mood and affect.  Intake/Output from previous day: No intake/output data recorded. Intake/Output this shift: No intake/output data recorded.  Lab Results: Recent Labs    09/29/23 0551 09/30/23 0642  WBC 15.5* 13.1*  HGB 9.8* 8.9*  HCT 30.2* 27.2*  PLT 352 337   BMET Recent Labs    09/29/23 0551 09/30/23 0642  NA 133* 135  K 3.9 3.9  CL 102 100  CO2 22 24  GLUCOSE 132* 114*  BUN <5* <5*  CREATININE 0.57 0.53  CALCIUM  8.5* 8.4*   LFT Recent Labs    09/30/23 0642  PROT 5.6*  ALBUMIN 2.0*  AST 38  ALT 27  ALKPHOS 103  BILITOT 0.5   PT/INR No results for input(s): "LABPROT", "INR" in the last 72 hours.  Studies/Results: MR ABDOMEN MRCP W WO  CONTAST Result Date: 09/30/2023 CLINICAL DATA:  Pancreatitis. EXAM: MRI ABDOMEN WITHOUT AND WITH CONTRAST (INCLUDING MRCP) TECHNIQUE: Multiplanar multisequence MR imaging of the abdomen was performed both before and after the administration of intravenous contrast. Heavily T2-weighted images of the biliary and pancreatic ducts were obtained, and three-dimensional MRCP images were rendered by post processing. CONTRAST:  10mL GADAVIST GADOBUTROL 1 MMOL/ML IV SOLN COMPARISON:  09/26/2023 abdominal  ultrasound.  CT 09/26/2023. FINDINGS: Moderate motion degradation throughout. Lower chest: Mild cardiomegaly. Hepatobiliary: Hepatomegaly at 21.3 cm craniocaudal. Probable mild hepatic steatosis. No gallstones. No intra or extrahepatic biliary duct dilatation. No choledocholithiasis. Pancreas: Markedly increased, moderate peripancreatic edema. Areas of relatively ill-defined fluid signal are seen caudal to the pancreatic neck at 3.2 x 2.7 cm on 29/6 and caudal to the pancreatic body/tail junction at 2.8 x 1.2 cm on 32/6. Given motion, no evidence of pancreatic necrosis. No duct dilatation. Spleen:  Normal in size, without focal abnormality. Adrenals/Urinary Tract: Normal adrenal glands. Interpolar left renal subcentimeter lesion is likely a cyst . In the absence of clinically indicated signs/symptoms require(s) no independent follow-up. Normal right kidney. No hydronephrosis. Stomach/Bowel: Colonic stool burden suggests constipation. Grossly normal stomach and abdominal small bowel. Vascular/Lymphatic: Aortic atherosclerosis. Patent portal vein. Splenic vein poorly evaluated. Felt to be patent in the splenic hilum on 49/21. No gross abdominal adenopathy. Other:  Mesenteric edema. Musculoskeletal: No acute osseous abnormality. IMPRESSION: 1. Moderately motion degraded exam. 2. Significant progression of moderate acute interstitial pancreatitis. No convincing evidence of pancreatic necrosis. Relatively ill-defined fluid signal in the peripancreatic space, as delineated above. Depending on clinical symptomatology, consider follow-up with cross-sectional imaging at 5-7 days to exclude developing pseudocyst. Given limitations of the current MRI, contrast enhanced CT likely of higher yield. 3. No choledocholithiasis or biliary duct dilatation. 4. Hepatomegaly and probable hepatic steatosis. 5.  Possible constipation. Electronically Signed   By: Lore Rode M.D.   On: 09/30/2023 14:12   MR 3D Recon At Scanner Result  Date: 09/30/2023 CLINICAL DATA:  Pancreatitis. EXAM: MRI ABDOMEN WITHOUT AND WITH CONTRAST (INCLUDING MRCP) TECHNIQUE: Multiplanar multisequence MR imaging of the abdomen was performed both before and after the administration of intravenous contrast. Heavily T2-weighted images of the biliary and pancreatic ducts were obtained, and three-dimensional MRCP images were rendered by post processing. CONTRAST:  10mL GADAVIST GADOBUTROL 1 MMOL/ML IV SOLN COMPARISON:  09/26/2023 abdominal ultrasound.  CT 09/26/2023. FINDINGS: Moderate motion degradation throughout. Lower chest: Mild cardiomegaly. Hepatobiliary: Hepatomegaly at 21.3 cm craniocaudal. Probable mild hepatic steatosis. No gallstones. No intra or extrahepatic biliary duct dilatation. No choledocholithiasis. Pancreas: Markedly increased, moderate peripancreatic edema. Areas of relatively ill-defined fluid signal are seen caudal to the pancreatic neck at 3.2 x 2.7 cm on 29/6 and caudal to the pancreatic body/tail junction at 2.8 x 1.2 cm on 32/6. Given motion, no evidence of pancreatic necrosis. No duct dilatation. Spleen:  Normal in size, without focal abnormality. Adrenals/Urinary Tract: Normal adrenal glands. Interpolar left renal subcentimeter lesion is likely a cyst . In the absence of clinically indicated signs/symptoms require(s) no independent follow-up. Normal right kidney. No hydronephrosis. Stomach/Bowel: Colonic stool burden suggests constipation. Grossly normal stomach and abdominal small bowel. Vascular/Lymphatic: Aortic atherosclerosis. Patent portal vein. Splenic vein poorly evaluated. Felt to be patent in the splenic hilum on 49/21. No gross abdominal adenopathy. Other:  Mesenteric edema. Musculoskeletal: No acute osseous abnormality. IMPRESSION: 1. Moderately motion degraded exam. 2. Significant progression of moderate acute interstitial pancreatitis. No convincing evidence of pancreatic necrosis. Relatively ill-defined fluid signal in  the  peripancreatic space, as delineated above. Depending on clinical symptomatology, consider follow-up with cross-sectional imaging at 5-7 days to exclude developing pseudocyst. Given limitations of the current MRI, contrast enhanced CT likely of higher yield. 3. No choledocholithiasis or biliary duct dilatation. 4. Hepatomegaly and probable hepatic steatosis. 5.  Possible constipation. Electronically Signed   By: Lore Rode M.D.   On: 09/30/2023 14:12       Assessment / Plan:    #61 53 year old white female with acute pancreatitis of unclear etiology with MRI/MRCP showing markedly increased moderate peripancreatic edema of the pancreas and possible early pseudocysts.  No necrosis  No worrisome lab parameters Pain still not under good control, may need to consider PCA  IgG4 still pending Possible medication induced pancreatitis most likely culprits are Lipitor and fenofibrate , we have stopped both of those as of today  #2 hypokalemia-correcting  Plan; start clear liquids Continue liberal fluid resuscitation currently on LR at 250, will decrease to 200 cc/h through tomorrow Await pending labs As above stop Lipitor and fenofibrate  Out of bed to chair, incentive spirometry Start MiraLAX twice daily, and add Dulcolax suppository daily as needed  Consider PCA, GI will follow with you    Principal Problem:   Pancreatitis Active Problems:   Migraine   Insomnia   Dyspnea   Essential hypertension   Sinus tachycardia   Constipation   Chronic pain   Hypothyroidism   Dyslipidemia   Obesity, Class III, BMI 40-49.9 (morbid obesity)   Epigastric pain   Nausea without vomiting     LOS: 2 days   Amy EsterwoodPA-C  10/01/2023, 8:27 AM

## 2023-10-01 NOTE — Plan of Care (Signed)
   Problem: Health Behavior/Discharge Planning: Goal: Ability to manage health-related needs will improve Outcome: Progressing

## 2023-10-01 NOTE — Plan of Care (Signed)

## 2023-10-01 NOTE — Progress Notes (Signed)
 PROGRESS NOTE  Kendra Parker:[id number] DOB: [birth year] DOA: 09/27/2023 PCP: Joenathan Muslim, FNP   LOS: 2 days   Brief Narrative / Interim history: 53 year old with HTN, HLD, chronic migraine headaches comes into the hospital with dull abdominal pain for the past 2 and half weeks, radiating into her back.  This has been associated with nausea, vomiting.  Given persistent symptoms presented to the ER, and a CT scan revealed acute pancreatitis with no necrosis or fluid collection.  Right upper quadrant ultrasound showed fatty liver, and gallbladder was unremarkable.  She was admitted to the hospital.  Subjective / 24h Interval events: She is having significant pain, nausea, does not want to eat anything.  Not really improving  Assesement and Plan: Principal Problem:   Pancreatitis Active Problems:   Dyspnea   Constipation   Essential hypertension   Sinus tachycardia   Migraine   Chronic pain   Dyslipidemia   Hypothyroidism   Insomnia   Obesity, Class III, BMI 40-49.9 (morbid obesity)   Epigastric pain   Nausea without vomiting   Principal problem Acute pancreatitis -continue supportive care, IV fluids, pain control.  CT scan on admission without complicating factors such as abscesses or necrosis.  There is no gallbladder pathology, no cholelithiasis or choledocholithiasis seen on imaging - She was recently started back on her Ubrelvy  for her migraines, and there are few case reports of pancreatitis associated with this. - She was also recently placed on Vyepti infusion every 3 months, but I do not see a correlation between this and pancreatitis. - Sumatriptan  can be linked with pancreatitis, but she tolerated it well for quite some time, but now I am holding this given lack of improvement - She is not improving despite n.p.o., IV fluids.  GI consulted, discussed with Dr. Karene Oto, etiology for her pancreatic inflammation is not entirely clear.  MRCP just showed worsening  pancreatitis without any stones.  Continue supportive care  Active problems Migraine headaches-on multiple medications at home, follows with neurology as an outpatient  Hyperlipidemia-continue statin  Hypothyroidism-continue Synthroid   Hypertension-continue metoprolol   Obesity, class III-BMI greater than 40.  She would benefit from weight loss  Scheduled Meds:  clonazePAM   1 mg Oral BID   DULoxetine   30 mg Oral QHS   DULoxetine   60 mg Oral QHS   enoxaparin  (LOVENOX ) injection  50 mg Subcutaneous Q24H   levETIRAcetam   500 mg Oral BID   levothyroxine   25 mcg Oral Daily   metoprolol  tartrate  50 mg Oral BID   pantoprazole (PROTONIX) IV  40 mg Intravenous QHS   polyethylene glycol  17 g Oral Daily   pregabalin   25 mg Oral Daily   QUEtiapine   200 mg Oral QHS   senna-docusate  1 tablet Oral BID   sodium chloride  flush  3 mL Intravenous Q12H   venlafaxine  XR  75 mg Oral Q breakfast   Continuous Infusions:  lactated ringers 250 mL/hr at 10/01/23 0650    PRN Meds:.acetaminophen  **OR** acetaminophen , acetaminophen  **AND** diphenhydrAMINE , albuterol , HYDROmorphone  (DILAUDID ) injection, methocarbamol , mouth rinse, oxyCODONE   Current Outpatient Medications  Medication Instructions   albuterol  (VENTOLIN  HFA) 108 (90 Base) MCG/ACT inhaler inhale 2 puffs into the lungs by mouth every 4 hours as needed for wheezing   atorvastatin  (LIPITOR) 20 mg, Oral, Daily   celecoxib (CELEBREX) 200 mg, Oral, Daily   clonazePAM  (KLONOPIN ) 1 mg, Oral, 2 times daily   Cyanocobalamin (VITAMIN B-12 PO) 1 capsule, Oral, Daily   diphenhydramine -acetaminophen  (TYLENOL  PM) 25-500 MG  TABS tablet 2 tablets, Oral, Daily at bedtime   DULoxetine  (CYMBALTA ) 30 mg, Oral, Daily at bedtime   DULoxetine  (CYMBALTA ) 60 mg, Oral, Daily at bedtime   Eptinezumab-jjmr (VYEPTI IV) 300 mg, Intravenous, Every 3 months, VYEPTI   fenofibrate  (TRICOR ) 48 MG tablet 1 tablet, Oral, Daily   FIBER PO 3 tablets, Oral, 2 times daily    HYDROcodone -acetaminophen  (NORCO/VICODIN) 5-325 MG tablet 1 tablet, Oral, 3 times daily   levETIRAcetam  (KEPPRA ) 500 mg, Oral, 2 times daily   levothyroxine  (SYNTHROID ) 25 MCG tablet 1 tablet, Oral, Daily   lisinopril (ZESTRIL) 10 mg, Oral, Daily   MAGNESIUM PO 1 tablet, Oral, Daily   methocarbamol  (ROBAXIN ) 750 MG tablet Take every 8 hours to treat acute migraines. #30 allowed per month.   metoprolol  tartrate (LOPRESSOR ) 50 mg, Oral, 2 times daily   Multiple Vitamins-Minerals (MULTIVITAMIN WITH MINERALS) tablet 1 tablet, Oral, Daily   naloxone (NARCAN) 0.4 mg, Nasal,  Once   Nurtec 75 mg, Oral, Every other day   ondansetron  (ZOFRAN -ODT) 4 mg, Oral, Every 8 hours PRN   pregabalin  (LYRICA ) 25 mg, Oral, Daily   QUEtiapine  (SEROQUEL ) 200 mg, Oral, Daily at bedtime   rizatriptan  (MAXALT -MLT) 10 mg, Oral, As needed   spironolactone (ALDACTONE) 25 mg, Oral, Daily   SUMAtriptan  (IMITREX ) 100 mg, Oral, Daily   Ubrelvy  100 mg, Oral, As needed   venlafaxine  XR (EFFEXOR  XR) 37.5 mg, Oral, Daily with breakfast   Vitamin D3 2,000 Units, Oral, Daily    Diet Orders (From admission, onward)     Start     Ordered   10/01/23 1104  Diet clear liquid Room service appropriate? Yes; Fluid consistency: Thin  Diet effective now       Question Answer Comment  Room service appropriate? Yes   Fluid consistency: Thin      10/01/23 1103            DVT prophylaxis:    Lab Results  Component Value Date   PLT 394 10/01/2023      Code Status: Full Code  Family Communication: family at bedside   Status is: Inpatient  Level of care: Telemetry Medical  Consultants:  None   Objective: Vitals:   09/30/23 1712 09/30/23 2031 10/01/23 0504 10/01/23 0817  BP: (!) 142/59 (!) 148/74 (!) 156/84 (!) 151/79  Pulse: (!) 106 (!) 110 (!) 102 (!) 110  Resp: 18 19 19 18   Temp: 98.2 F (36.8 C) 98.9 F (37.2 C) 99.3 F (37.4 C) 98.2 F (36.8 C)  TempSrc: Oral Oral Oral   SpO2: 94% 95% 100% 95%   Weight:      Height:        Intake/Output Summary (Last 24 hours) at 10/01/2023 1104 Last data filed at 10/01/2023 0945 Gross per 24 hour  Intake 0 ml  Output 0 ml  Net 0 ml   Wt Readings from Last 3 Encounters:  09/27/23 110 kg  09/26/23 110.2 kg  11/28/20 105.7 kg    Examination:  Constitutional: NAD Eyes: lids and conjunctivae normal, no scleral icterus ENMT: mmm Neck: normal, supple Respiratory: clear to auscultation bilaterally, no wheezing, no crackles. Normal respiratory effort.  Cardiovascular: Regular rate and rhythm, no murmurs / rubs / gallops. No LE edema. Abdomen: soft, no distention, no tenderness. Bowel sounds positive.    Data Reviewed: I have independently reviewed following labs and imaging studies   CBC Recent Labs  Lab 09/27/23 0943 09/28/23 0500 09/29/23 0551 09/30/23 0642 10/01/23 0808  WBC 9.9  11.8* 15.5* 13.1* 10.5  HGB 10.9* 10.3* 9.8* 8.9* 8.8*  HCT 33.2* 31.3* 30.2* 27.2* 26.7*  PLT 372 341 352 337 394  MCV 94.6 95.7 96.5 96.5 95.7  MCH 31.1 31.5 31.3 31.6 31.5  MCHC 32.8 32.9 32.5 32.7 33.0  RDW 14.2 14.1 14.1 13.8 13.7    Recent Labs  Lab 09/27/23 0943 09/27/23 1435 09/28/23 0500 09/29/23 0551 09/30/23 0642 10/01/23 0808  NA 132*  --  135 133* 135 136  K 3.8  --  4.3 3.9 3.9 3.4*  CL 97*  --  100 102 100 100  CO2 20*  --  23 22 24 23   GLUCOSE 119*  --  117* 132* 114* 116*  BUN 7  --  7 <5* <5* <5*  CREATININE 0.59  --  0.55 0.57 0.53 0.49  CALCIUM  8.9  --  8.8* 8.5* 8.4* 8.3*  AST 51*  --  50* 51* 38 33  ALT 30  --  28 34 27 24  ALKPHOS 125  --  107 116 103 95  BILITOT 0.5  --  0.4 0.4 0.5 0.6  ALBUMIN 2.6*  --  2.2* 2.2* 2.0* 2.0*  MG  --   --   --  1.7 1.7  --   TSH  --  1.692  --   --   --   --     ------------------------------------------------------------------------------------------------------------------ No results for input(s): "CHOL", "HDL", "LDLCALC", "TRIG", "CHOLHDL", "LDLDIRECT" in the last 72  hours.   No results found for: "HGBA1C" ------------------------------------------------------------------------------------------------------------------ No results for input(s): "TSH", "T4TOTAL", "T3FREE", "THYROIDAB" in the last 72 hours.  Invalid input(s): "FREET3"   Cardiac Enzymes No results for input(s): "CKMB", "TROPONINI", "MYOGLOBIN" in the last 168 hours.  Invalid input(s): "CK" ------------------------------------------------------------------------------------------------------------------ No results found for: "BNP"  CBG: No results for input(s): "GLUCAP" in the last 168 hours.  No results found for this or any previous visit (from the past 240 hours).   Radiology Studies: DG Chest 2 View Result Date: 10/01/2023 CLINICAL DATA:  Shortness of breath. EXAM: CHEST - 2 VIEW COMPARISON:  09/27/2023, CT 09/26/2023 FINDINGS: The heart is normal in size with stable mediastinal contours. Scattered atelectasis, greatest at the lung bases. There is mild peribronchial thickening. No confluent consolidation. No significant pleural effusion. No pneumothorax. IMPRESSION: Mild peribronchial thickening and scattered atelectasis. Electronically Signed   By: Chadwick Colonel M.D.   On: 10/01/2023 11:00   MR ABDOMEN MRCP W WO CONTAST Result Date: 09/30/2023 CLINICAL DATA:  Pancreatitis. EXAM: MRI ABDOMEN WITHOUT AND WITH CONTRAST (INCLUDING MRCP) TECHNIQUE: Multiplanar multisequence MR imaging of the abdomen was performed both before and after the administration of intravenous contrast. Heavily T2-weighted images of the biliary and pancreatic ducts were obtained, and three-dimensional MRCP images were rendered by post processing. CONTRAST:  10mL GADAVIST GADOBUTROL 1 MMOL/ML IV SOLN COMPARISON:  09/26/2023 abdominal ultrasound.  CT 09/26/2023. FINDINGS: Moderate motion degradation throughout. Lower chest: Mild cardiomegaly. Hepatobiliary: Hepatomegaly at 21.3 cm craniocaudal. Probable mild hepatic  steatosis. No gallstones. No intra or extrahepatic biliary duct dilatation. No choledocholithiasis. Pancreas: Markedly increased, moderate peripancreatic edema. Areas of relatively ill-defined fluid signal are seen caudal to the pancreatic neck at 3.2 x 2.7 cm on 29/6 and caudal to the pancreatic body/tail junction at 2.8 x 1.2 cm on 32/6. Given motion, no evidence of pancreatic necrosis. No duct dilatation. Spleen:  Normal in size, without focal abnormality. Adrenals/Urinary Tract: Normal adrenal glands. Interpolar left renal subcentimeter lesion is likely a cyst . In the  absence of clinically indicated signs/symptoms require(s) no independent follow-up. Normal right kidney. No hydronephrosis. Stomach/Bowel: Colonic stool burden suggests constipation. Grossly normal stomach and abdominal small bowel. Vascular/Lymphatic: Aortic atherosclerosis. Patent portal vein. Splenic vein poorly evaluated. Felt to be patent in the splenic hilum on 49/21. No gross abdominal adenopathy. Other:  Mesenteric edema. Musculoskeletal: No acute osseous abnormality. IMPRESSION: 1. Moderately motion degraded exam. 2. Significant progression of moderate acute interstitial pancreatitis. No convincing evidence of pancreatic necrosis. Relatively ill-defined fluid signal in the peripancreatic space, as delineated above. Depending on clinical symptomatology, consider follow-up with cross-sectional imaging at 5-7 days to exclude developing pseudocyst. Given limitations of the current MRI, contrast enhanced CT likely of higher yield. 3. No choledocholithiasis or biliary duct dilatation. 4. Hepatomegaly and probable hepatic steatosis. 5.  Possible constipation. Electronically Signed   By: Lore Rode M.D.   On: 09/30/2023 14:12   MR 3D Recon At Scanner Result Date: 09/30/2023 CLINICAL DATA:  Pancreatitis. EXAM: MRI ABDOMEN WITHOUT AND WITH CONTRAST (INCLUDING MRCP) TECHNIQUE: Multiplanar multisequence MR imaging of the abdomen was performed  both before and after the administration of intravenous contrast. Heavily T2-weighted images of the biliary and pancreatic ducts were obtained, and three-dimensional MRCP images were rendered by post processing. CONTRAST:  10mL GADAVIST GADOBUTROL 1 MMOL/ML IV SOLN COMPARISON:  09/26/2023 abdominal ultrasound.  CT 09/26/2023. FINDINGS: Moderate motion degradation throughout. Lower chest: Mild cardiomegaly. Hepatobiliary: Hepatomegaly at 21.3 cm craniocaudal. Probable mild hepatic steatosis. No gallstones. No intra or extrahepatic biliary duct dilatation. No choledocholithiasis. Pancreas: Markedly increased, moderate peripancreatic edema. Areas of relatively ill-defined fluid signal are seen caudal to the pancreatic neck at 3.2 x 2.7 cm on 29/6 and caudal to the pancreatic body/tail junction at 2.8 x 1.2 cm on 32/6. Given motion, no evidence of pancreatic necrosis. No duct dilatation. Spleen:  Normal in size, without focal abnormality. Adrenals/Urinary Tract: Normal adrenal glands. Interpolar left renal subcentimeter lesion is likely a cyst . In the absence of clinically indicated signs/symptoms require(s) no independent follow-up. Normal right kidney. No hydronephrosis. Stomach/Bowel: Colonic stool burden suggests constipation. Grossly normal stomach and abdominal small bowel. Vascular/Lymphatic: Aortic atherosclerosis. Patent portal vein. Splenic vein poorly evaluated. Felt to be patent in the splenic hilum on 49/21. No gross abdominal adenopathy. Other:  Mesenteric edema. Musculoskeletal: No acute osseous abnormality. IMPRESSION: 1. Moderately motion degraded exam. 2. Significant progression of moderate acute interstitial pancreatitis. No convincing evidence of pancreatic necrosis. Relatively ill-defined fluid signal in the peripancreatic space, as delineated above. Depending on clinical symptomatology, consider follow-up with cross-sectional imaging at 5-7 days to exclude developing pseudocyst. Given limitations  of the current MRI, contrast enhanced CT likely of higher yield. 3. No choledocholithiasis or biliary duct dilatation. 4. Hepatomegaly and probable hepatic steatosis. 5.  Possible constipation. Electronically Signed   By: Lore Rode M.D.   On: 09/30/2023 14:12      Kathlen Para, MD, PhD [facility]  Between 7 am - 7 pm I am available, please contact me via Amion (for emergencies) or Securechat (non urgent messages)  Between 7 pm - 7 am I am not available, please contact night coverage MD/APP via Amion

## 2023-10-02 ENCOUNTER — Inpatient Hospital Stay (HOSPITAL_COMMUNITY)

## 2023-10-02 DIAGNOSIS — R0600 Dyspnea, unspecified: Secondary | ICD-10-CM | POA: Diagnosis not present

## 2023-10-02 DIAGNOSIS — K859 Acute pancreatitis without necrosis or infection, unspecified: Secondary | ICD-10-CM | POA: Diagnosis not present

## 2023-10-02 MED ORDER — LACTATED RINGERS IV SOLN: INTRAVENOUS | Status: AC

## 2023-10-02 NOTE — Progress Notes (Signed)
 PROGRESS NOTE  Kendra Parker ZOX:[id number] DOB: 22-Sep-1970 DOA: 09/27/2023 PCP: Joenathan Muslim, FNP   LOS: 3 days   Brief Narrative / Interim history: 53 year old with HTN, HLD, chronic migraine headaches comes into the hospital with dull abdominal pain for the past 2 and half weeks, radiating into her back.  This has been associated with nausea, vomiting.  Given persistent symptoms presented to the ER, and a CT scan revealed acute pancreatitis with no necrosis or fluid collection.  Right upper quadrant ultrasound showed fatty liver, and gallbladder was unremarkable.  She was admitted to the hospital.  Subjective / 24h Interval events: Continues to have significant pain.  Unable to eat anything without making her pain worse  Assesement and Plan: Principal Problem:   Pancreatitis Active Problems:   Dyspnea   Constipation   Essential hypertension   Sinus tachycardia   Migraine   Chronic pain   Dyslipidemia   Hypothyroidism   Insomnia   Obesity, Class III, BMI 40-49.9 (morbid obesity)   Epigastric pain   Nausea without vomiting   Principal problem Acute pancreatitis -continue supportive care, IV fluids, pain control.  CT scan on admission without complicating factors such as abscesses or necrosis.  There is no gallbladder pathology, no cholelithiasis or choledocholithiasis seen on imaging - She was recently started back on her Ubrelvy  for her migraines, and there are few case reports of pancreatitis associated with this. - She was also recently placed on Vyepti infusion every 3 months, but I do not see a correlation between this and pancreatitis. - Sumatriptan  can be linked with pancreatitis, but she tolerated it well for quite some time, but now I am holding this given lack of improvement - She is not improving despite n.p.o., IV fluids.  Appreciate gastroenterology follow-up.  Clinical picture is somewhat puzzling as she is not improving, persistently tender and unable to  tolerate fluids.  May need to do postpyloric feeding, timing deferred to GI  Active problems Migraine headaches-on multiple medications at home, follows with neurology as an outpatient  Hyperlipidemia-continue statin  Hypothyroidism-continue Synthroid   Hypertension-continue metoprolol   Obesity, class III-BMI greater than 40.  She would benefit from weight loss  Scheduled Meds:  clonazePAM   1 mg Oral BID   DULoxetine   30 mg Oral QHS   DULoxetine   60 mg Oral QHS   enoxaparin  (LOVENOX ) injection  50 mg Subcutaneous Q24H   levETIRAcetam   500 mg Oral BID   levothyroxine   25 mcg Oral Daily   metoprolol  tartrate  50 mg Oral BID   pantoprazole (PROTONIX) IV  40 mg Intravenous QHS   polyethylene glycol  17 g Oral BID   pregabalin   25 mg Oral Daily   QUEtiapine   200 mg Oral QHS   senna-docusate  1 tablet Oral BID   sodium chloride  flush  3 mL Intravenous Q12H   venlafaxine  XR  75 mg Oral Q breakfast   Continuous Infusions:  lactated ringers 200 mL/hr at 10/02/23 0944    PRN Meds:.acetaminophen  **OR** acetaminophen , acetaminophen  **AND** diphenhydrAMINE , albuterol , bisacodyl, HYDROmorphone  (DILAUDID ) injection, methocarbamol , mouth rinse, oxyCODONE   Current Outpatient Medications  Medication Instructions   albuterol  (VENTOLIN  HFA) 108 (90 Base) MCG/ACT inhaler inhale 2 puffs into the lungs by mouth every 4 hours as needed for wheezing   atorvastatin  (LIPITOR) 20 mg, Oral, Daily   celecoxib (CELEBREX) 200 mg, Oral, Daily   clonazePAM  (KLONOPIN ) 1 mg, Oral, 2 times daily   Cyanocobalamin (VITAMIN B-12 PO) 1 capsule, Oral, Daily   diphenhydramine -acetaminophen  (  TYLENOL  PM) 25-500 MG TABS tablet 2 tablets, Oral, Daily at bedtime   DULoxetine  (CYMBALTA ) 30 mg, Oral, Daily at bedtime   DULoxetine  (CYMBALTA ) 60 mg, Oral, Daily at bedtime   Eptinezumab-jjmr (VYEPTI IV) 300 mg, Intravenous, Every 3 months, VYEPTI   fenofibrate  (TRICOR ) 48 MG tablet 1 tablet, Oral, Daily   FIBER PO 3  tablets, Oral, 2 times daily   HYDROcodone -acetaminophen  (NORCO/VICODIN) 5-325 MG tablet 1 tablet, Oral, 3 times daily   levETIRAcetam  (KEPPRA ) 500 mg, Oral, 2 times daily   levothyroxine  (SYNTHROID ) 25 MCG tablet 1 tablet, Oral, Daily   lisinopril (ZESTRIL) 10 mg, Oral, Daily   MAGNESIUM PO 1 tablet, Oral, Daily   methocarbamol  (ROBAXIN ) 750 MG tablet Take every 8 hours to treat acute migraines. #30 allowed per month.   metoprolol  tartrate (LOPRESSOR ) 50 mg, Oral, 2 times daily   Multiple Vitamins-Minerals (MULTIVITAMIN WITH MINERALS) tablet 1 tablet, Oral, Daily   naloxone (NARCAN) 0.4 mg, Nasal,  Once   Nurtec 75 mg, Oral, Every other day   ondansetron  (ZOFRAN -ODT) 4 mg, Oral, Every 8 hours PRN   pregabalin  (LYRICA ) 25 mg, Oral, Daily   QUEtiapine  (SEROQUEL ) 200 mg, Oral, Daily at bedtime   rizatriptan  (MAXALT -MLT) 10 mg, Oral, As needed   spironolactone (ALDACTONE) 25 mg, Oral, Daily   SUMAtriptan  (IMITREX ) 100 mg, Oral, Daily   Ubrelvy  100 mg, Oral, As needed   venlafaxine  XR (EFFEXOR  XR) 37.5 mg, Oral, Daily with breakfast   Vitamin D3 2,000 Units, Oral, Daily    Diet Orders (From admission, onward)     Start     Ordered   10/01/23 1104  Diet clear liquid Room service appropriate? Yes; Fluid consistency: Thin  Diet effective now       Question Answer Comment  Room service appropriate? Yes   Fluid consistency: Thin      10/01/23 1103            DVT prophylaxis:    Lab Results  Component Value Date   PLT 394 10/01/2023      Code Status: Full Code  Family Communication: no family at bedside   Status is: Inpatient  Level of care: Telemetry Medical  Consultants:  None   Objective: Vitals:   10/01/23 1612 10/01/23 2051 10/02/23 0527 10/02/23 0530  BP: 138/79 (!) 144/77 (!) 154/85 (!) 154/85  Pulse: 96 92 93 93  Resp: 16 18 18 18   Temp: 99.1 F (37.3 C) 98.1 F (36.7 C) 98.5 F (36.9 C) 98.5 F (36.9 C)  TempSrc: Oral Oral Oral Oral  SpO2: 94% 95%  94% 94%  Weight:      Height:        Intake/Output Summary (Last 24 hours) at 10/02/2023 1121 Last data filed at 10/02/2023 0158 Gross per 24 hour  Intake 6661.73 ml  Output 0 ml  Net 6661.73 ml   Wt Readings from Last 3 Encounters:  09/27/23 110 kg  09/26/23 110.2 kg  11/28/20 105.7 kg    Examination:  Constitutional: NAD Eyes: lids and conjunctivae normal, no scleral icterus ENMT: mmm Neck: normal, supple Respiratory: clear to auscultation bilaterally, no wheezing, no crackles.  Cardiovascular: Regular rate and rhythm, no murmurs / rubs / gallops.  Abdomen: soft, no distention, no tenderness. Bowel sounds positive.    Data Reviewed: I have independently reviewed following labs and imaging studies   CBC Recent Labs  Lab 09/27/23 0943 09/28/23 0500 09/29/23 0551 09/30/23 0642 10/01/23 0808  WBC 9.9 11.8* 15.5* 13.1* 10.5  HGB 10.9* 10.3* 9.8* 8.9* 8.8*  HCT 33.2* 31.3* 30.2* 27.2* 26.7*  PLT 372 341 352 337 394  MCV 94.6 95.7 96.5 96.5 95.7  MCH 31.1 31.5 31.3 31.6 31.5  MCHC 32.8 32.9 32.5 32.7 33.0  RDW 14.2 14.1 14.1 13.8 13.7    Recent Labs  Lab 09/27/23 0943 09/27/23 1435 09/28/23 0500 09/29/23 0551 09/30/23 0642 10/01/23 0808  NA 132*  --  135 133* 135 136  K 3.8  --  4.3 3.9 3.9 3.4*  CL 97*  --  100 102 100 100  CO2 20*  --  23 22 24 23   GLUCOSE 119*  --  117* 132* 114* 116*  BUN 7  --  7 <5* <5* <5*  CREATININE 0.59  --  0.55 0.57 0.53 0.49  CALCIUM  8.9  --  8.8* 8.5* 8.4* 8.3*  AST 51*  --  50* 51* 38 33  ALT 30  --  28 34 27 24  ALKPHOS 125  --  107 116 103 95  BILITOT 0.5  --  0.4 0.4 0.5 0.6  ALBUMIN 2.6*  --  2.2* 2.2* 2.0* 2.0*  MG  --   --   --  1.7 1.7  --   TSH  --  1.692  --   --   --   --     ------------------------------------------------------------------------------------------------------------------ No results for input(s): "CHOL", "HDL", "LDLCALC", "TRIG", "CHOLHDL", "LDLDIRECT" in the last 72 hours.   No results  found for: "HGBA1C" ------------------------------------------------------------------------------------------------------------------ No results for input(s): "TSH", "T4TOTAL", "T3FREE", "THYROIDAB" in the last 72 hours.  Invalid input(s): "FREET3"   Cardiac Enzymes No results for input(s): "CKMB", "TROPONINI", "MYOGLOBIN" in the last 168 hours.  Invalid input(s): "CK" ------------------------------------------------------------------------------------------------------------------ No results found for: "BNP"  CBG: No results for input(s): "GLUCAP" in the last 168 hours.  No results found for this or any previous visit (from the past 240 hours).   Radiology Studies: No results found.     Kathlen Para, MD, PhD [facility]  Between 7 am - 7 pm I am available, please contact me via Amion (for emergencies) or Securechat (non urgent messages)  Between 7 pm - 7 am I am not available, please contact night coverage MD/APP via Amion

## 2023-10-02 NOTE — Progress Notes (Addendum)
 Patient ID: Kendra Parker, female   DOB: 1970/10/14, 53 y.o.   MRN: [id number]     Attending physician's note   I have taken a history, reviewed the chart, and examined the patient. I performed a substantive portion of this encounter, including complete performance of at least one of the key components, in conjunction with the APP. I agree with the APP's note, impression, and recommendations with my edits.   Have eliminated medications associated with pancreatitis, but has not made significant improvement in the last 24 hours.  Tried liquids, but recurrence of abdominal pain.  Otherwise HD stable.  Labs today are pending.  - Discussed changing to PCA, but patient would like to keep current pain medications. - Gentle, small volume sips of clears as tolerated - CXR to evaluate for pulmonary edema, pleural effusions - Postpyloric feeding tube placement - Encourage ambulation - MRCP on 5/9 with moderate pancreatitis.  If no significant improvement in the coming days, will need to consider repeat cross-sectional imaging with CT - GI service will continue to follow.  Dr. General Kenner will assume ongoing inpatient GI care starting tomorrow  Harry Lindau, DO, FACG [phone] office          Progress Note   Subjective   Day # 6 CC; acute pancreatitis  Tmax 99 1/ sat 94 on room air Patient says her pain level is still about the same unable to get below a 7 out of 10 even with the pain medication.  She had significant increase in pain with attempt at clear liquid diet yesterday but no vomiting. She denies shortness of breath.  Has been able to get out of bed  Labs today pending IgG4 pending   Objective   Vital signs in last 24 hours: Temp:  [98.1 F (36.7 C)-99.1 F (37.3 C)] 98.5 F (36.9 C) (05/11 0530) Pulse Rate:  [92-96] 93 (05/11 0530) Resp:  [16-18] 18 (05/11 0530) BP: (138-154)/(77-85) 154/85 (05/11 0530) SpO2:  [94 %-95 %] 94 % (05/11 0530) Last BM Date :  10/01/23 General: Older white female in NAD, pleasant, mild increased work of breathing Heart:  tachy Regular rate and rhythm; no murmurs Lungs: Respirations even and unlabored some mild increased work of breathing, no rhonchi or rails somewhat decreased breath sounds bases Abdomen:  Soft, obese, bowel sounds are present tender across the upper and mid abdomen some guarding no rebound Extremities:  Without edema. Neurologic:  Alert and oriented,  grossly normal neurologically. Psych:  Cooperative. Normal mood and affect.  Intake/Output from previous day: 05/10 0701 - 05/11 0700 In: 6661.7 [P.O.:100; I.V.:6561.7] Out: 0  Intake/Output this shift: No intake/output data recorded.  Lab Results: Recent Labs    09/30/23 0642 10/01/23 0808  WBC 13.1* 10.5  HGB 8.9* 8.8*  HCT 27.2* 26.7*  PLT 337 394   BMET Recent Labs    09/30/23 0642 10/01/23 0808  NA 135 136  K 3.9 3.4*  CL 100 100  CO2 24 23  GLUCOSE 114* 116*  BUN <5* <5*  CREATININE 0.53 0.49  CALCIUM  8.4* 8.3*   LFT Recent Labs    10/01/23 0808  PROT 5.8*  ALBUMIN 2.0*  AST 33  ALT 24  ALKPHOS 95  BILITOT 0.6   PT/INR No results for input(s): "LABPROT", "INR" in the last 72 hours.  Studies/Results: DG Chest 2 View Result Date: 10/01/2023 CLINICAL DATA:  Shortness of breath. EXAM: CHEST - 2 VIEW COMPARISON:  09/27/2023, CT 09/26/2023 FINDINGS: The heart is normal  in size with stable mediastinal contours. Scattered atelectasis, greatest at the lung bases. There is mild peribronchial thickening. No confluent consolidation. No significant pleural effusion. No pneumothorax. IMPRESSION: Mild peribronchial thickening and scattered atelectasis. Electronically Signed   By: Chadwick Colonel M.D.   On: 10/01/2023 11:00   MR ABDOMEN MRCP W WO CONTAST Result Date: 09/30/2023 CLINICAL DATA:  Pancreatitis. EXAM: MRI ABDOMEN WITHOUT AND WITH CONTRAST (INCLUDING MRCP) TECHNIQUE: Multiplanar multisequence MR imaging of the  abdomen was performed both before and after the administration of intravenous contrast. Heavily T2-weighted images of the biliary and pancreatic ducts were obtained, and three-dimensional MRCP images were rendered by post processing. CONTRAST:  10mL GADAVIST GADOBUTROL 1 MMOL/ML IV SOLN COMPARISON:  09/26/2023 abdominal ultrasound.  CT 09/26/2023. FINDINGS: Moderate motion degradation throughout. Lower chest: Mild cardiomegaly. Hepatobiliary: Hepatomegaly at 21.3 cm craniocaudal. Probable mild hepatic steatosis. No gallstones. No intra or extrahepatic biliary duct dilatation. No choledocholithiasis. Pancreas: Markedly increased, moderate peripancreatic edema. Areas of relatively ill-defined fluid signal are seen caudal to the pancreatic neck at 3.2 x 2.7 cm on 29/6 and caudal to the pancreatic body/tail junction at 2.8 x 1.2 cm on 32/6. Given motion, no evidence of pancreatic necrosis. No duct dilatation. Spleen:  Normal in size, without focal abnormality. Adrenals/Urinary Tract: Normal adrenal glands. Interpolar left renal subcentimeter lesion is likely a cyst . In the absence of clinically indicated signs/symptoms require(s) no independent follow-up. Normal right kidney. No hydronephrosis. Stomach/Bowel: Colonic stool burden suggests constipation. Grossly normal stomach and abdominal small bowel. Vascular/Lymphatic: Aortic atherosclerosis. Patent portal vein. Splenic vein poorly evaluated. Felt to be patent in the splenic hilum on 49/21. No gross abdominal adenopathy. Other:  Mesenteric edema. Musculoskeletal: No acute osseous abnormality. IMPRESSION: 1. Moderately motion degraded exam. 2. Significant progression of moderate acute interstitial pancreatitis. No convincing evidence of pancreatic necrosis. Relatively ill-defined fluid signal in the peripancreatic space, as delineated above. Depending on clinical symptomatology, consider follow-up with cross-sectional imaging at 5-7 days to exclude developing  pseudocyst. Given limitations of the current MRI, contrast enhanced CT likely of higher yield. 3. No choledocholithiasis or biliary duct dilatation. 4. Hepatomegaly and probable hepatic steatosis. 5.  Possible constipation. Electronically Signed   By: Lore Rode M.D.   On: 09/30/2023 14:12   MR 3D Recon At Scanner Result Date: 09/30/2023 CLINICAL DATA:  Pancreatitis. EXAM: MRI ABDOMEN WITHOUT AND WITH CONTRAST (INCLUDING MRCP) TECHNIQUE: Multiplanar multisequence MR imaging of the abdomen was performed both before and after the administration of intravenous contrast. Heavily T2-weighted images of the biliary and pancreatic ducts were obtained, and three-dimensional MRCP images were rendered by post processing. CONTRAST:  10mL GADAVIST GADOBUTROL 1 MMOL/ML IV SOLN COMPARISON:  09/26/2023 abdominal ultrasound.  CT 09/26/2023. FINDINGS: Moderate motion degradation throughout. Lower chest: Mild cardiomegaly. Hepatobiliary: Hepatomegaly at 21.3 cm craniocaudal. Probable mild hepatic steatosis. No gallstones. No intra or extrahepatic biliary duct dilatation. No choledocholithiasis. Pancreas: Markedly increased, moderate peripancreatic edema. Areas of relatively ill-defined fluid signal are seen caudal to the pancreatic neck at 3.2 x 2.7 cm on 29/6 and caudal to the pancreatic body/tail junction at 2.8 x 1.2 cm on 32/6. Given motion, no evidence of pancreatic necrosis. No duct dilatation. Spleen:  Normal in size, without focal abnormality. Adrenals/Urinary Tract: Normal adrenal glands. Interpolar left renal subcentimeter lesion is likely a cyst . In the absence of clinically indicated signs/symptoms require(s) no independent follow-up. Normal right kidney. No hydronephrosis. Stomach/Bowel: Colonic stool burden suggests constipation. Grossly normal stomach and abdominal small bowel. Vascular/Lymphatic: Aortic atherosclerosis.  Patent portal vein. Splenic vein poorly evaluated. Felt to be patent in the splenic hilum on  49/21. No gross abdominal adenopathy. Other:  Mesenteric edema. Musculoskeletal: No acute osseous abnormality. IMPRESSION: 1. Moderately motion degraded exam. 2. Significant progression of moderate acute interstitial pancreatitis. No convincing evidence of pancreatic necrosis. Relatively ill-defined fluid signal in the peripancreatic space, as delineated above. Depending on clinical symptomatology, consider follow-up with cross-sectional imaging at 5-7 days to exclude developing pseudocyst. Given limitations of the current MRI, contrast enhanced CT likely of higher yield. 3. No choledocholithiasis or biliary duct dilatation. 4. Hepatomegaly and probable hepatic steatosis. 5.  Possible constipation. Electronically Signed   By: Lore Rode M.D.   On: 09/30/2023 14:12       Assessment / Plan:    #63 53 year old female with acute pancreatitis of unclear etiology. MRI/MRCP 2 days ago showed marked increase in moderate peripancreatic edema and possibly early pseudocyst development, no necrosis.  No worrisome lab parameters yesterday, labs pending today  Has had a difficult time with pain control, we discussed PCA with her today and she is hesitant to proceed with this as she does not want to overdo the pain medications or become addicted.  She wants to stick with her current regimen and says she will let us  know if she cannot tolerate the pain any longer. Day 5/ 6 of hospitalization, and still unable to tolerate any p.o.'s, will need to start enteral nutrition  IgG4 still pending Pancreatitis may be medication induced, we have stopped Lipitor and fenofibrate  on the hospitalist stopped sumatriptan .  #2 mild dyspnea,-will rule out volume overload  Plan; sips of clear liquids only Decrease LR to 125 cc/h Follow-up today's labs Chest x-ray 2 view New incentive spirometry  Placed orders to get core track placed needs to be postpyloric, then start low volume feeds  Patient aware that if she decides  that she wants to proceed with a PCA for better pain control that that will be an option,  GI will continue to follow closely with you     Principal Problem:   Pancreatitis Active Problems:   Migraine   Insomnia   Dyspnea   Essential hypertension   Sinus tachycardia   Constipation   Chronic pain   Hypothyroidism   Dyslipidemia   Obesity, Class III, BMI 40-49.9 (morbid obesity)   Epigastric pain   Nausea without vomiting     LOS: 3 days   Amy EsterwoodPA-C  10/02/2023, 11:53 AM

## 2023-10-03 ENCOUNTER — Inpatient Hospital Stay (HOSPITAL_COMMUNITY)

## 2023-10-03 DIAGNOSIS — Z800 Family history of malignant neoplasm of digestive organs: Secondary | ICD-10-CM

## 2023-10-03 DIAGNOSIS — D649 Anemia, unspecified: Secondary | ICD-10-CM

## 2023-10-03 DIAGNOSIS — R160 Hepatomegaly, not elsewhere classified: Secondary | ICD-10-CM | POA: Diagnosis not present

## 2023-10-03 DIAGNOSIS — K859 Acute pancreatitis without necrosis or infection, unspecified: Secondary | ICD-10-CM | POA: Diagnosis not present

## 2023-10-03 LAB — COMPREHENSIVE METABOLIC PANEL WITH GFR
ALT: 26 U/L (ref 0–44)
AST: 39 U/L (ref 15–41)
Albumin: 1.9 g/dL — ABNORMAL LOW (ref 3.5–5.0)
Alkaline Phosphatase: 81 U/L (ref 38–126)
Anion gap: 10 (ref 5–15)
BUN: <5 mg/dL — ABNORMAL LOW (ref 6–20)
CO2: 26 mmol/L (ref 22–32)
Calcium: 8.6 mg/dL — ABNORMAL LOW (ref 8.9–10.3)
Chloride: 103 mmol/L (ref 98–111)
Creatinine, Ser: 0.44 mg/dL (ref 0.44–1.00)
GFR, Estimated: >60 mL/min (ref >60)
Glucose, Bld: 107 mg/dL — ABNORMAL HIGH (ref 70–99)
Potassium: 3.5 mmol/L (ref 3.5–5.1)
Sodium: 139 mmol/L (ref 135–145)
Total Bilirubin: 0.5 mg/dL (ref 0.0–1.2)
Total Protein: 5.5 g/dL — ABNORMAL LOW (ref 6.5–8.1)

## 2023-10-03 LAB — LIPASE, BLOOD: Lipase: 80 U/L — ABNORMAL HIGH (ref 11–51)

## 2023-10-03 LAB — CBC
HCT: 26.3 % — ABNORMAL LOW (ref 36.0–46.0)
Hemoglobin: 8.4 g/dL — ABNORMAL LOW (ref 12.0–15.0)
MCH: 31 pg (ref 26.0–34.0)
MCHC: 31.9 g/dL (ref 30.0–36.0)
MCV: 97 fL (ref 80.0–100.0)
Platelets: 471 10*3/uL — ABNORMAL HIGH (ref 150–400)
RBC: 2.71 MIL/uL — ABNORMAL LOW (ref 3.87–5.11)
RDW: 13.7 % (ref 11.5–15.5)
WBC: 6.5 10*3/uL (ref 4.0–10.5)
nRBC: 0 % (ref 0.0–0.2)

## 2023-10-03 LAB — MAGNESIUM: Magnesium: 1.7 mg/dL (ref 1.7–2.4)

## 2023-10-03 LAB — GLUCOSE, CAPILLARY: Glucose-Capillary: 102 mg/dL — ABNORMAL HIGH (ref 70–99)

## 2023-10-03 LAB — POTASSIUM: Potassium: 3.7 mmol/L (ref 3.5–5.1)

## 2023-10-03 MED ORDER — ADULT MULTIVITAMIN W/MINERALS CH
1.0000 | ORAL_TABLET | Freq: Every day | ORAL | Status: DC
  Administered 2023-10-03 – 2023-10-04 (×2): 1
  Filled 2023-10-03 (×2): qty 1

## 2023-10-03 MED ORDER — THIAMINE MONONITRATE 100 MG PO TABS
100.0000 mg | ORAL_TABLET | Freq: Every day | ORAL | Status: DC
  Administered 2023-10-03 – 2023-10-04 (×2): 100 mg
  Filled 2023-10-03 (×2): qty 1

## 2023-10-03 MED ORDER — SODIUM CHLORIDE (PF) 0.9 % IJ SOLN
INTRAMUSCULAR | Status: AC
  Administered 2023-10-03: 10 mL
  Filled 2023-10-03: qty 10

## 2023-10-03 MED ORDER — VITAL 1.5 CAL PO LIQD
1000.0000 mL | ORAL | Status: DC
Start: 2023-10-03 — End: 2023-10-04
  Administered 2023-10-03: 1000 mL
  Filled 2023-10-03: qty 1000

## 2023-10-03 MED ORDER — VITAL HIGH PROTEIN PO LIQD
1000.0000 mL | ORAL | Status: DC
  Filled 2023-10-03: qty 1000

## 2023-10-03 MED ORDER — BOOST / RESOURCE BREEZE PO LIQD CUSTOM
1.0000 | Freq: Three times a day (TID) | ORAL | Status: DC
  Administered 2023-10-03: 1 via ORAL

## 2023-10-03 NOTE — Procedures (Signed)
 Cortrak  Person Inserting Tube:  Charnice Zwilling, Kerstin Peeling, RD Tube Type:  Cortrak - 55 inches Tube Size:  10 Tube Location:  Left nare Initial Placement:  Stomach Secured by: Bridle Technique Used to Measure Tube Placement:  Marking at nare/corner of mouth Cortrak Secured At:  74 cm   Cortrak Tube Team Note:  Consult received to place a Cortrak feeding tube.   Imaging is required. RN may begin using tube once placement is confirmed.   If the tube becomes dislodged please keep the tube and contact the Cortrak team at [email or web address] for replacement.  If after hours and replacement cannot be delayed, place a NG tube and confirm placement with an abdominal x-ray.    Frederik Jansky, RD Registered Dietitian  See Amion for more information

## 2023-10-03 NOTE — Progress Notes (Signed)
 Daily Progress Note  DOA: 09/27/2023 Hospital Day: 7   Chief Complaint: acute pancreatitis  ASSESSMENT    53 y.o. year old female with a medical history not necessarily limited to migraines, HTN, hypothyroidism, obesity, chronic neck / shoulder pain. Admitted 5/6 with N/V/ abdominal pain and acute pancreatitis. See 5/9 GI consult note.   Acute pancreatitis. Etiology not clear .  Biliary source not suspected. Etoh not factor. Trig, Ca++ okay.  No known FH of pancreatic disease.   ? Medication related ( migraine meds, statin?).  Today:  Renal function normal .Albumin 1.9. WBC 13.1. Afebrile. Continues to severe, generalized left sided abdominal pain aggravated by PO intake ( including clear liquids). Requiring oxycodone  and Dilaudid .   [place] anemia, ? Chronic Hgb in Nov 2024 was 11.5 Hgb now in 8 range in setting IV fluids  Hepatomegaly / possible steatosis.  Normal LFTs  FMH of colon cancer in father at age 43  PLAN   --Cortrak tube in place now. Nutritionist to assess needs and start post-pyloric feedings --If still having severe pain tomorrow will consider repeat imaging ( last imaging was on 5/9)  --Outpatient workup of anemia and screening colonoscopy after resolution of pancreatitis.   Subjective   Awoke her from sleep. Has 10 /10 abdominal pain. Clear liquids this am made pain worse.   Objective   GI Studies:   Trig 77 MRI/MRCP completed earlier today showed markedly increased moderate peripancreatic edema with areas of ill-defined fluid signal seen caudal to the pancreatic neck at 3.2 x 2.7 cm and caudal to the pancreatic body/tail junction at 2.8 x 1.2 cm without evidence of pancreatic necrosis or PD dilatation.   Recent Labs    10/01/23 0808 10/03/23 0448  WBC 10.5 6.5  HGB 8.8* 8.4*  HCT 26.7* 26.3*  MCV 95.7 97.0  PLT 394 471*    Recent Labs    10/01/23 0808 10/03/23 0448  NA 136 139  K 3.4* 3.5  CL 100 103  CO2 23 26  GLUCOSE 116* 107*  BUN  <5* <5*  CREATININE 0.49 0.44  CALCIUM  8.3* 8.6*   Recent Labs    10/01/23 0808 10/03/23 0448  PROT 5.8* 5.5*  ALBUMIN 2.0* 1.9*  AST 33 39  ALT 24 26  ALKPHOS 95 81  BILITOT 0.6 0.5     Imaging:  DG Chest Port 1 View CLINICAL DATA:  Dyspnea.  EXAM: PORTABLE CHEST 1 VIEW  COMPARISON:  Radiograph yesterday  FINDINGS: The heart is prominent in size. Increasing opacity at the right lung base. Left lung base atelectasis is stable. Improving peribronchial thickening. Question developing right pleural effusion. No pneumothorax.  IMPRESSION: 1. Increasing opacity at the right lung base, may represent atelectasis or pneumonia. Question developing right pleural effusion. 2. Improving peribronchial thickening.  Electronically Signed   By: Chadwick Colonel M.D.   On: 10/02/2023 16:29     Scheduled inpatient medications:   clonazePAM   1 mg Oral BID   DULoxetine   30 mg Oral QHS   DULoxetine   60 mg Oral QHS   enoxaparin  (LOVENOX ) injection  50 mg Subcutaneous Q24H   levETIRAcetam   500 mg Oral BID   levothyroxine   25 mcg Oral Daily   metoprolol  tartrate  50 mg Oral BID   pantoprazole (PROTONIX) IV  40 mg Intravenous QHS   polyethylene glycol  17 g Oral BID   pregabalin   25 mg Oral Daily   QUEtiapine   200 mg Oral QHS   senna-docusate  1 tablet Oral BID   sodium chloride  flush  3 mL Intravenous Q12H   venlafaxine  XR  75 mg Oral Q breakfast   Continuous inpatient infusions:   lactated ringers 75 mL/hr at 10/03/23 0638   PRN inpatient medications: acetaminophen  **OR** acetaminophen , acetaminophen  **AND** diphenhydrAMINE , albuterol , bisacodyl, HYDROmorphone  (DILAUDID ) injection, methocarbamol , mouth rinse, oxyCODONE   Vital signs in last 24 hours: Temp:  [97.4 F (36.3 C)-98.3 F (36.8 C)] 97.9 F (36.6 C) (05/12 0828) Pulse Rate:  [82-98] 90 (05/12 0828) Resp:  [16-18] 18 (05/12 0828) BP: (123-149)/(72-89) 133/72 (05/12 0828) SpO2:  [93 %-100 %] 93 % (05/12  0828) Weight:  [111.9 kg] 111.9 kg (05/12 0700) Last BM Date : 10/01/23  Intake/Output Summary (Last 24 hours) at 10/03/2023 1053 Last data filed at 10/02/2023 2200 Gross per 24 hour  Intake 516.9 ml  Output 0 ml  Net 516.9 ml    Intake/Output from previous day: 05/11 0701 - 05/12 0700 In: 516.9 [P.O.:100; I.V.:416.9] Out: 0  Intake/Output this shift: No intake/output data recorded.   Physical Exam:  General: awoke from sleep. In NAD Heart:  Regular rate and rhythm.  Pulmonary: Normal respiratory effort Abdomen: Soft, nondistended, mild left mid abdominal tenderness. A few bowel sounds. Extremities: 1+ BLE edema edema  Neurologic: Alert and oriented Psych: Pleasant. Cooperative     LOS: 4 days   Mai Schwalbe ,NP 10/03/2023, 10:53 AM

## 2023-10-03 NOTE — Progress Notes (Addendum)
 Initial Nutrition Assessment  DOCUMENTATION CODES:  Morbid obesity  INTERVENTION:  Initiate tube feeding via Cortrak: Vital1.5 at 50 ml/h (1200 ml per day) Initiate at 20ml/hr and DO NOT ADVANCE When appropriate, advance by 54ml/hr q12 hours until goal rate achieved  Provides 1880 kcal, 81 gm protein, 917 ml free water daily   Boost Breeze po TID, each supplement provides 250 kcal and 9 grams of protein  MVI w/ minerals Monitor magnesium, potassium, and phosphorus dailyfor at least 3 days Add Thiamine 100 mg daily for 5 days  Monitor diet advancement and tolerance  NUTRITION DIAGNOSIS:  Inadequate oral intake related to inability to eat, poor appetite, acute illness as evidenced by per patient/family report, estimated needs.  GOAL:  Patient will meet greater than or equal to 90% of their needs  MONITOR:  PO intake, TF tolerance, Supplement acceptance  REASON FOR ASSESSMENT:  Consult Assessment of nutrition requirement/status, Enteral/tube feeding initiation and management  ASSESSMENT:   Pt with PMH significant for: HTN, HLD, chronic migraine headaches presented to hospital w/ abdominal pain x2.5 weeks. Reported it radiating into her back accompanied by N/V. CT revealed acute pancreatitis. RUG ultrasound showed fatty liver.  5/6 admitted, clear liquid diet 5/8 NPO  5/9 MRI/MRCP: markedly increased moderate peripancreatic edema w/ areas of relatively ill-defined fluid signal seen 5/10 clear liquid diet  Pancreatitis is of unknown etiology. GI recommending Cortrak placement as she has not had adequate nutrition in over 3 weeks. Unable to achieve post-pyloric and MD amicable to trialing trickles and assess tolerance tomorrow and ability to advance.   Average Meal Intake 5/7: 20-100% x3 documented meals 5/10: 0% x3 documented meals  She is having difficulty with PO intake and reports worsening abdominal pain s/p meal consumption for two and a half weeks PTA. Does not meet  estimated calorie/protein needs at baseline. See 24 hour recall below. No reported issues with chewing or swallowing. Upper dentures not in today, but she reports she has them and they are not ill-fitting.  24 Hour Recall B: yogurt w/ juice or water L: skips D: soup and salad or sandwich  She is at elevated risk for refeeding. Refeeding protocol and addition of thiamine recommended. No vomiting since admission, but notable nausea. Reporting ice chips causing GI upset this morning.   Discussed tube feeding with patient this morning who is amicable to proceeding. Discussed need for adequate calories and protein to prevent loss of lean body mass or development of skin breakdown. She verbalized understanding and is amicable to proceeding. Went over s/s of intolerance as well.   Admit Weight: 110kg Current Weight: 111.9kg  UBW reported as around 244lbs. No significant weight change reported in last 6-12 months. No edema on exam. Did finally have BM x2 yesterday. She reports BM every other day at baseline. No recent weight hx to review in chart, only from 2022.   Drains/Lines: Cortrak (55in) placed 5/12  Meds: levothyroxine , pantoprazole, Miralax, senna-docusate  Labs:  Na+ 133--->139 (wdl) K+ 3.4>3.5 (wdl) Mg 1.7 (wdl) Lipase 78>80 (H) CBGs 107-116 x24 hours  NUTRITION - FOCUSED PHYSICAL EXAM:  Flowsheet Row Most Recent Value  Orbital Region No depletion  Upper Arm Region No depletion  Thoracic and Lumbar Region No depletion  Buccal Region No depletion  Temple Region No depletion  Clavicle Bone Region No depletion  Clavicle and Acromion Bone Region No depletion  Scapular Bone Region No depletion  Dorsal Hand No depletion  Patellar Region No depletion  Anterior Thigh Region No depletion  Posterior Calf Region No depletion  Edema (RD Assessment) None  Hair Reviewed  Eyes Reviewed  Mouth Reviewed  [upper dentures not in, but present,  fit well, per patient report]  Skin  Reviewed  Nails Reviewed   Diet Order:   Diet Order             Diet clear liquid Room service appropriate? Yes; Fluid consistency: Thin  Diet effective now            EDUCATION NEEDS:   Education needs have been addressed  Skin:  Skin Assessment: Reviewed RN Assessment  Last BM:  5/11 - type 3/7 x2  Height:  Ht Readings from Last 1 Encounters:  09/27/23 5\' 3"  (1.6 m)   Weight:  Wt Readings from Last 1 Encounters:  10/03/23 111.9 kg   Ideal Body Weight:  52.3 kg  BMI:  Body mass index is 43.7 kg/m.  Estimated Nutritional Needs:   Kcal:  1700-1900 kcals  Protein:  75-90g  Fluid:  >1.7L/day  Con Decant MS, RD, LDN Registered Dietitian Clinical Nutrition RD Inpatient Contact Info in Amion

## 2023-10-03 NOTE — TOC Progression Note (Signed)
 Transition of Care [facility]) - Progression Note    Patient Details  Name: Kendra Parker MRN: [id number] Date of Birth: [birth year]  [facility] CM/SW Contact  Tom-Johnson, Kylan Liberati Daphne, RN Phone Number: 10/03/2023, 10:54 AM  Clinical Narrative:     Patient continues on pain management for worsening abdominal pain mostly when she tries to eat. GI following.   Patient not Medically ready for discharge.  CM will continue to follow as patient progresses with care towards discharge.           Expected Discharge Plan and Services                                               Social Determinants of Health (SDOH) Interventions SDOH Screenings   Food Insecurity: No Food Insecurity (09/27/2023)  Housing: Low Risk  (09/27/2023)  Transportation Needs: No Transportation Needs (09/27/2023)  Utilities: Not At Risk (09/27/2023)  Tobacco Use: Medium Risk (09/27/2023)    Readmission Risk Interventions     No data to display

## 2023-10-03 NOTE — Progress Notes (Signed)
 PROGRESS NOTE  Kendra Parker ZOX:[id number] DOB: [birth year] DOA: 09/27/2023 PCP: Joenathan Muslim, FNP   LOS: 4 days   Brief Narrative / Interim history: 53 year old with HTN, HLD, chronic migraine headaches comes into the hospital with dull abdominal pain for the past 2 and half weeks, radiating into her back.  This has been associated with nausea, vomiting.  Given persistent symptoms presented to the ER, and a CT scan revealed acute pancreatitis with no necrosis or fluid collection.  Right upper quadrant ultrasound showed fatty liver, and gallbladder was unremarkable.  She was admitted to the hospital.  Subjective / 24h Interval events: Continues to have significant pain.  It gets worse every time she tries to eat.  Assesement and Plan: Principal Problem:   Pancreatitis Active Problems:   Dyspnea   Constipation   Essential hypertension   Sinus tachycardia   Migraine   Chronic pain   Dyslipidemia   Hypothyroidism   Insomnia   Obesity, Class III, BMI 40-49.9 (morbid obesity)   Epigastric pain   Nausea without vomiting   Principal problem Acute pancreatitis -continue supportive care, IV fluids, pain control.  CT scan on admission without complicating factors such as abscesses or necrosis.  There is no gallbladder pathology, no cholelithiasis or choledocholithiasis seen on imaging - She was recently started back on her Ubrelvy  for her migraines, and there are few case reports of pancreatitis associated with this. - She was also recently placed on Vyepti infusion every 3 months, but I do not see a correlation between this and pancreatitis. - Sumatriptan  can be linked with pancreatitis, but she tolerated it well for quite some time, but now I am holding this given lack of improvement - She is not improving despite n.p.o., IV fluids.  Appreciate gastroenterology follow-up.  Clinical picture is somewhat puzzling as she is not improving, persistently tender and unable to tolerate  fluids.  May need to do postpyloric feeding, GI to see him today.  Appreciate input.  Active problems Migraine headaches-on multiple medications at home, follows with neurology as an outpatient.  Migraines acceptable today  Hyperlipidemia-continue statin  Hypothyroidism-continue Synthroid   Hypertension-continue metoprolol   Obesity, class III-BMI greater than 40.  She would benefit from weight loss  Scheduled Meds:  clonazePAM   1 mg Oral BID   DULoxetine   30 mg Oral QHS   DULoxetine   60 mg Oral QHS   enoxaparin  (LOVENOX ) injection  50 mg Subcutaneous Q24H   levETIRAcetam   500 mg Oral BID   levothyroxine   25 mcg Oral Daily   metoprolol  tartrate  50 mg Oral BID   pantoprazole (PROTONIX) IV  40 mg Intravenous QHS   polyethylene glycol  17 g Oral BID   pregabalin   25 mg Oral Daily   QUEtiapine   200 mg Oral QHS   senna-docusate  1 tablet Oral BID   sodium chloride  flush  3 mL Intravenous Q12H   venlafaxine  XR  75 mg Oral Q breakfast   Continuous Infusions:  lactated ringers 75 mL/hr at 10/03/23 0638    PRN Meds:.acetaminophen  **OR** acetaminophen , acetaminophen  **AND** diphenhydrAMINE , albuterol , bisacodyl, HYDROmorphone  (DILAUDID ) injection, methocarbamol , mouth rinse, oxyCODONE   Current Outpatient Medications  Medication Instructions   albuterol  (VENTOLIN  HFA) 108 (90 Base) MCG/ACT inhaler inhale 2 puffs into the lungs by mouth every 4 hours as needed for wheezing   atorvastatin  (LIPITOR) 20 mg, Oral, Daily   celecoxib (CELEBREX) 200 mg, Oral, Daily   clonazePAM  (KLONOPIN ) 1 mg, Oral, 2 times daily   Cyanocobalamin (VITAMIN B-12  PO) 1 capsule, Oral, Daily   diphenhydramine -acetaminophen  (TYLENOL  PM) 25-500 MG TABS tablet 2 tablets, Oral, Daily at bedtime   DULoxetine  (CYMBALTA ) 30 mg, Oral, Daily at bedtime   DULoxetine  (CYMBALTA ) 60 mg, Oral, Daily at bedtime   Eptinezumab-jjmr (VYEPTI IV) 300 mg, Intravenous, Every 3 months, VYEPTI   fenofibrate  (TRICOR ) 48 MG tablet 1  tablet, Oral, Daily   FIBER PO 3 tablets, Oral, 2 times daily   HYDROcodone -acetaminophen  (NORCO/VICODIN) 5-325 MG tablet 1 tablet, Oral, 3 times daily   levETIRAcetam  (KEPPRA ) 500 mg, Oral, 2 times daily   levothyroxine  (SYNTHROID ) 25 MCG tablet 1 tablet, Oral, Daily   lisinopril (ZESTRIL) 10 mg, Oral, Daily   MAGNESIUM PO 1 tablet, Oral, Daily   methocarbamol  (ROBAXIN ) 750 MG tablet Take every 8 hours to treat acute migraines. #30 allowed per month.   metoprolol  tartrate (LOPRESSOR ) 50 mg, Oral, 2 times daily   Multiple Vitamins-Minerals (MULTIVITAMIN WITH MINERALS) tablet 1 tablet, Oral, Daily   naloxone (NARCAN) 0.4 mg, Nasal,  Once   Nurtec 75 mg, Oral, Every other day   ondansetron  (ZOFRAN -ODT) 4 mg, Oral, Every 8 hours PRN   pregabalin  (LYRICA ) 25 mg, Oral, Daily   QUEtiapine  (SEROQUEL ) 200 mg, Oral, Daily at bedtime   rizatriptan  (MAXALT -MLT) 10 mg, Oral, As needed   spironolactone (ALDACTONE) 25 mg, Oral, Daily   SUMAtriptan  (IMITREX ) 100 mg, Oral, Daily   Ubrelvy  100 mg, Oral, As needed   venlafaxine  XR (EFFEXOR  XR) 37.5 mg, Oral, Daily with breakfast   Vitamin D3 2,000 Units, Oral, Daily    Diet Orders (From admission, onward)     Start     Ordered   10/01/23 1104  Diet clear liquid Room service appropriate? Yes; Fluid consistency: Thin  Diet effective now       Question Answer Comment  Room service appropriate? Yes   Fluid consistency: Thin      10/01/23 1103            DVT prophylaxis:    Lab Results  Component Value Date   PLT 471 (H) 10/03/2023      Code Status: Full Code  Family Communication: no family at bedside   Status is: Inpatient  Level of care: Telemetry Medical  Consultants:  None   Objective: Vitals:   10/02/23 2102 10/03/23 0500 10/03/23 0700 10/03/23 0828  BP: 123/77 137/89  133/72  Pulse: 98 82  90  Resp: 18 18  18   Temp: 98.1 F (36.7 C) (!) 97.4 F (36.3 C)  97.9 F (36.6 C)  TempSrc: Oral Oral  Oral  SpO2: 97% 98%   93%  Weight:   111.9 kg   Height:        Intake/Output Summary (Last 24 hours) at 10/03/2023 1610 Last data filed at 10/02/2023 2200 Gross per 24 hour  Intake 516.9 ml  Output 0 ml  Net 516.9 ml   Wt Readings from Last 3 Encounters:  10/03/23 111.9 kg  09/26/23 110.2 kg  11/28/20 105.7 kg    Examination:  Constitutional: NAD Eyes: lids and conjunctivae normal, no scleral icterus ENMT: mmm Neck: normal, supple Respiratory: clear to auscultation bilaterally, no wheezing, no crackles.  Cardiovascular: Regular rate and rhythm, no murmurs / rubs / gallops.  Abdomen: soft, no distention, no tenderness. Bowel sounds positive.    Data Reviewed: I have independently reviewed following labs and imaging studies   CBC Recent Labs  Lab 09/28/23 0500 09/29/23 0551 09/30/23 0642 10/01/23 0808 10/03/23 0448  WBC 11.8*  15.5* 13.1* 10.5 6.5  HGB 10.3* 9.8* 8.9* 8.8* 8.4*  HCT 31.3* 30.2* 27.2* 26.7* 26.3*  PLT 341 352 337 394 471*  MCV 95.7 96.5 96.5 95.7 97.0  MCH 31.5 31.3 31.6 31.5 31.0  MCHC 32.9 32.5 32.7 33.0 31.9  RDW 14.1 14.1 13.8 13.7 13.7    Recent Labs  Lab 09/27/23 1435 09/28/23 0500 09/29/23 0551 09/30/23 0642 10/01/23 0808 10/03/23 0448  NA  --  135 133* 135 136 139  K  --  4.3 3.9 3.9 3.4* 3.5  CL  --  100 102 100 100 103  CO2  --  23 22 24 23 26   GLUCOSE  --  117* 132* 114* 116* 107*  BUN  --  7 <5* <5* <5* <5*  CREATININE  --  0.55 0.57 0.53 0.49 0.44  CALCIUM   --  8.8* 8.5* 8.4* 8.3* 8.6*  AST  --  50* 51* 38 33 39  ALT  --  28 34 27 24 26   ALKPHOS  --  107 116 103 95 81  BILITOT  --  0.4 0.4 0.5 0.6 0.5  ALBUMIN  --  2.2* 2.2* 2.0* 2.0* 1.9*  MG  --   --  1.7 1.7  --  1.7  TSH 1.692  --   --   --   --   --     ------------------------------------------------------------------------------------------------------------------ No results for input(s): "CHOL", "HDL", "LDLCALC", "TRIG", "CHOLHDL", "LDLDIRECT" in the last 72 hours.   No results  found for: "HGBA1C" ------------------------------------------------------------------------------------------------------------------ No results for input(s): "TSH", "T4TOTAL", "T3FREE", "THYROIDAB" in the last 72 hours.  Invalid input(s): "FREET3"   Cardiac Enzymes No results for input(s): "CKMB", "TROPONINI", "MYOGLOBIN" in the last 168 hours.  Invalid input(s): "CK" ------------------------------------------------------------------------------------------------------------------ No results found for: "BNP"  CBG: No results for input(s): "GLUCAP" in the last 168 hours.  No results found for this or any previous visit (from the past 240 hours).   Radiology Studies: DG Chest Port 1 View Result Date: 10/02/2023 CLINICAL DATA:  Dyspnea. EXAM: PORTABLE CHEST 1 VIEW COMPARISON:  Radiograph yesterday FINDINGS: The heart is prominent in size. Increasing opacity at the right lung base. Left lung base atelectasis is stable. Improving peribronchial thickening. Question developing right pleural effusion. No pneumothorax. IMPRESSION: 1. Increasing opacity at the right lung base, may represent atelectasis or pneumonia. Question developing right pleural effusion. 2. Improving peribronchial thickening. Electronically Signed   By: Chadwick Colonel M.D.   On: 10/02/2023 16:29       Kathlen Para, MD, PhD [facility]  Between 7 am - 7 pm I am available, please contact me via Amion (for emergencies) or Securechat (non urgent messages)  Between 7 pm - 7 am I am not available, please contact night coverage MD/APP via Amion

## 2023-10-03 NOTE — Progress Notes (Incomplete)
 Per patientt,whwnever she drinks juiuce,broth her stomach hurts

## 2023-10-04 DIAGNOSIS — K859 Acute pancreatitis without necrosis or infection, unspecified: Secondary | ICD-10-CM | POA: Diagnosis not present

## 2023-10-04 LAB — CBC
HCT: 28 % — ABNORMAL LOW (ref 36.0–46.0)
Hemoglobin: 9 g/dL — ABNORMAL LOW (ref 12.0–15.0)
MCH: 31.6 pg (ref 26.0–34.0)
MCHC: 32.1 g/dL (ref 30.0–36.0)
MCV: 98.2 fL (ref 80.0–100.0)
Platelets: 490 10*3/uL — ABNORMAL HIGH (ref 150–400)
RBC: 2.85 MIL/uL — ABNORMAL LOW (ref 3.87–5.11)
RDW: 13.6 % (ref 11.5–15.5)
WBC: 5.4 10*3/uL (ref 4.0–10.5)
nRBC: 0 % (ref 0.0–0.2)

## 2023-10-04 LAB — MAGNESIUM: Magnesium: 1.8 mg/dL (ref 1.7–2.4)

## 2023-10-04 LAB — GLUCOSE, CAPILLARY
Glucose-Capillary: 105 mg/dL — ABNORMAL HIGH (ref 70–99)
Glucose-Capillary: 113 mg/dL — ABNORMAL HIGH (ref 70–99)
Glucose-Capillary: 115 mg/dL — ABNORMAL HIGH (ref 70–99)
Glucose-Capillary: 117 mg/dL — ABNORMAL HIGH (ref 70–99)
Glucose-Capillary: 119 mg/dL — ABNORMAL HIGH (ref 70–99)
Glucose-Capillary: 132 mg/dL — ABNORMAL HIGH (ref 70–99)

## 2023-10-04 LAB — PHOSPHORUS: Phosphorus: 4.1 mg/dL (ref 2.5–4.6)

## 2023-10-04 LAB — IGG 4: IgG, Subclass 4: 5 mg/dL (ref 2–96)

## 2023-10-04 MED ORDER — SODIUM CHLORIDE (PF) 0.9 % IJ SOLN
INTRAMUSCULAR | Status: AC
Start: 2023-10-04 — End: 2023-10-04
  Administered 2023-10-04: 10 mL
  Filled 2023-10-04: qty 10

## 2023-10-04 MED ORDER — VITAL 1.5 CAL PO LIQD
1000.0000 mL | ORAL | Status: DC
  Administered 2023-10-04 – 2023-10-05 (×2): 1000 mL
  Filled 2023-10-04 (×3): qty 1000

## 2023-10-04 MED ORDER — THIAMINE MONONITRATE 100 MG PO TABS
100.0000 mg | ORAL_TABLET | Freq: Every day | ORAL | Status: AC
  Administered 2023-10-05 – 2023-10-07 (×3): 100 mg via ORAL
  Filled 2023-10-04 (×3): qty 1

## 2023-10-04 MED ORDER — ADULT MULTIVITAMIN W/MINERALS CH
1.0000 | ORAL_TABLET | Freq: Every day | ORAL | Status: DC
  Administered 2023-10-05 – 2023-10-22 (×18): 1 via ORAL
  Filled 2023-10-04 (×18): qty 1

## 2023-10-04 NOTE — Plan of Care (Signed)
  Problem: Education: Goal: Knowledge of General Education information will improve Description: Including pain rating scale, medication(s)/side effects and non-pharmacologic comfort measures Outcome: Progressing   Problem: Health Behavior/Discharge Planning: Goal: Ability to manage health-related needs will improve Outcome: Progressing   Problem: Clinical Measurements: Goal: Ability to maintain clinical measurements within normal limits will improve Outcome: Progressing Goal: Will remain free from infection Outcome: Progressing Goal: Diagnostic test results will improve Outcome: Progressing Goal: Respiratory complications will improve Outcome: Progressing Goal: Cardiovascular complication will be avoided Outcome: Progressing   Problem: Activity: Goal: Risk for activity intolerance will decrease Outcome: Progressing   Problem: Nutrition: Goal: Adequate nutrition will be maintained Outcome: Progressing   Problem: Coping: Goal: Level of anxiety will decrease Outcome: Progressing   Problem: Elimination: Goal: Will not experience complications related to bowel motility Outcome: Progressing Goal: Will not experience complications related to urinary retention Outcome: Progressing   Problem: Pain Managment: Goal: General experience of comfort will improve and/or be controlled Outcome: Progressing   Problem: Safety: Goal: Ability to remain free from injury will improve Outcome: Progressing   Problem: Skin Integrity: Goal: Risk for impaired skin integrity will decrease Outcome: Progressing  Tolerating tube feeds at rate of 30, poor po intake, + loose stools with TF, pain meds still ongoing

## 2023-10-04 NOTE — Progress Notes (Signed)
 Progress Note   Subjective  Patient appears to be tolerating tube feeds. No vomiting. Whenever she drinks clears she feels worse. Pain about the same.    Objective   Vital signs in last 24 hours: Temp:  [97.8 F (36.6 C)-98.3 F (36.8 C)] 98.1 F (36.7 C) (05/13 0822) Pulse Rate:  [78-91] 91 (05/13 0822) Resp:  [17-18] 18 (05/13 0434) BP: (127-177)/(77-94) 127/81 (05/13 0822) SpO2:  [94 %-99 %] 97 % (05/13 0822) Last BM Date : 10/03/23 General:    white female in NAD, cortrak in place Abdomen:  Soft, mid / epigastric TTP Neurologic:  Alert and oriented,  grossly normal neurologically. Psych:  Cooperative. Normal mood and affect.  Intake/Output from previous day: 05/12 0701 - 05/13 0700 In: 297.3 [P.O.:100; NG/GT:197.3] Out: 0  Intake/Output this shift: Total I/O In: 229 [P.O.:100; I.V.:3; NG/GT:126] Out: -   Lab Results: Recent Labs    10/03/23 0448  WBC 6.5  HGB 8.4*  HCT 26.3*  PLT 471*   BMET Recent Labs    10/03/23 0448 10/03/23 1524  NA 139  --   K 3.5 3.7  CL 103  --   CO2 26  --   GLUCOSE 107*  --   BUN <5*  --   CREATININE 0.44  --   CALCIUM  8.6*  --    LFT Recent Labs    10/03/23 0448  PROT 5.5*  ALBUMIN 1.9*  AST 39  ALT 26  ALKPHOS 81  BILITOT 0.5   PT/INR No results for input(s): "LABPROT", "INR" in the last 72 hours.  Studies/Results: DG Abd Portable 1V Result Date: 10/03/2023 CLINICAL DATA:  Nasogastric tube placement. EXAM: PORTABLE ABDOMEN - 1 VIEW COMPARISON:  None Available. FINDINGS: Tip of the weighted enteric tube in the right upper quadrant in the region of the distal stomach or proximal duodenum. No bowel dilatation in the included upper abdomen. IMPRESSION: Tip of the weighted enteric tube in the right upper quadrant in the region of the distal stomach or proximal duodenum. Electronically Signed   By: Chadwick Colonel M.D.   On: 10/03/2023 11:48   DG Chest Port 1 View Result Date: 10/02/2023 CLINICAL DATA:   Dyspnea. EXAM: PORTABLE CHEST 1 VIEW COMPARISON:  Radiograph yesterday FINDINGS: The heart is prominent in size. Increasing opacity at the right lung base. Left lung base atelectasis is stable. Improving peribronchial thickening. Question developing right pleural effusion. No pneumothorax. IMPRESSION: 1. Increasing opacity at the right lung base, may represent atelectasis or pneumonia. Question developing right pleural effusion. 2. Improving peribronchial thickening. Electronically Signed   By: Chadwick Colonel M.D.   On: 10/02/2023 16:29       Assessment / Plan:    53 y/o female here with the following:  Acute pancreatitis - idiopathic - med induced? All potential offending meds stopped.  HD#8. Cortrak placed and she is tolerating initiation of tube feeds without vomiting. She has not done well with clear liquids but so far tolerating tube feeds. Will increase slowly if she tolerates it. WBC has trended down, labs for today pending. She remains in bed, still complains of baseline pain which is unchanged. Will await lab today. I think she will warrant repeat imaging of her pancreas to assess for complications from it, fluid collections, etc. She is hemodynamically stable currently, afebrile. Last imaged on 5/9. Discussed timing on when to repeat imaging. She is agreeable to continue tube feeds today and consider imaging tomorrow with CT if she  is not feeling improved. This will be a prolonged course given her hospital course to date, suspect it will take her several more days to recover.   Will follow call with questions.  Christi Coward, MD [facility]

## 2023-10-04 NOTE — Progress Notes (Signed)
 PROGRESS NOTE  Kendra Parker [id number]:[id number] DOB: 29-Jul-1970 DOA: 09/27/2023 PCP: Joenathan Muslim, FNP   LOS: 5 days   Brief Narrative / Interim history: 53 year old with HTN, HLD, chronic migraine headaches comes into the hospital with dull abdominal pain for the past 2 and half weeks, radiating into her back.  This has been associated with nausea, vomiting.  Given persistent symptoms presented to the ER, and a CT scan revealed acute pancreatitis with no necrosis or fluid collection.  Right upper quadrant ultrasound showed fatty liver, and gallbladder was unremarkable.  She was admitted to the hospital.  Subjective / 24h Interval events: Does not feel like the trickle tube feeds are bothering her, but every time she tries to drink something she gets worsening of her abdominal pain  Assesement and Plan: Principal Problem:   Pancreatitis Active Problems:   Dyspnea   Constipation   Essential hypertension   Sinus tachycardia   Migraine   Chronic pain   Dyslipidemia   Hypothyroidism   Insomnia   Obesity, Class III, BMI 40-49.9 (morbid obesity)   Epigastric pain   Nausea without vomiting   Principal problem Acute pancreatitis -continue supportive care, IV fluids, pain control.  CT scan on admission without complicating factors such as abscesses or necrosis.  There is no gallbladder pathology, no cholelithiasis or choledocholithiasis seen on imaging - She is following with neurology as an outpatient and is on Ubrelvy , Vyepti, sumatriptan , none of them highly associated with pancreatitis.  I discontinued sumatriptan  as this could be associated with it - Given lack of improvement in initial couple of days with IV fluids, n.p.o., failure to advance diet gastroenterology consulted.  She eventually had an Dobbhoff placed and now getting trickle tube feeds.  GI considering reimaging, defer to them timing  Active problems Migraine headaches-on multiple medications at home, follows with  neurology as an outpatient.  Migraines acceptable today  Hyperlipidemia-continue statin  Hypothyroidism-continue Synthroid   Hypertension-continue metoprolol , blood pressure acceptable, elevated at times due to pain  Obesity, class III-BMI greater than 40.  She would benefit from weight loss  Scheduled Meds:  clonazePAM   1 mg Oral BID   DULoxetine   30 mg Oral QHS   DULoxetine   60 mg Oral QHS   enoxaparin  (LOVENOX ) injection  50 mg Subcutaneous Q24H   feeding supplement  1 Container Oral TID BM   levETIRAcetam   500 mg Oral BID   levothyroxine   25 mcg Oral Daily   metoprolol  tartrate  50 mg Oral BID   multivitamin with minerals  1 tablet Per Tube Daily   pantoprazole (PROTONIX) IV  40 mg Intravenous QHS   polyethylene glycol  17 g Oral BID   pregabalin   25 mg Oral Daily   QUEtiapine   200 mg Oral QHS   senna-docusate  1 tablet Oral BID   sodium chloride  flush  3 mL Intravenous Q12H   thiamine  100 mg Per Tube Daily   venlafaxine  XR  75 mg Oral Q breakfast   Continuous Infusions:  feeding supplement (VITAL 1.5 CAL) 1,000 mL (10/03/23 1708)    PRN Meds:.acetaminophen  **OR** acetaminophen , acetaminophen  **AND** diphenhydrAMINE , albuterol , bisacodyl, HYDROmorphone  (DILAUDID ) injection, methocarbamol , mouth rinse, oxyCODONE   Current Outpatient Medications  Medication Instructions   albuterol  (VENTOLIN  HFA) 108 (90 Base) MCG/ACT inhaler inhale 2 puffs into the lungs by mouth every 4 hours as needed for wheezing   atorvastatin  (LIPITOR) 20 mg, Oral, Daily   celecoxib (CELEBREX) 200 mg, Oral, Daily   clonazePAM  (KLONOPIN ) 1 mg, Oral,  2 times daily   Cyanocobalamin (VITAMIN B-12 PO) 1 capsule, Oral, Daily   diphenhydramine -acetaminophen  (TYLENOL  PM) 25-500 MG TABS tablet 2 tablets, Oral, Daily at bedtime   DULoxetine  (CYMBALTA ) 30 mg, Oral, Daily at bedtime   DULoxetine  (CYMBALTA ) 60 mg, Oral, Daily at bedtime   Eptinezumab-jjmr (VYEPTI IV) 300 mg, Intravenous, Every 3 months, VYEPTI    fenofibrate  (TRICOR ) 48 MG tablet 1 tablet, Oral, Daily   FIBER PO 3 tablets, Oral, 2 times daily   HYDROcodone -acetaminophen  (NORCO/VICODIN) 5-325 MG tablet 1 tablet, Oral, 3 times daily   levETIRAcetam  (KEPPRA ) 500 mg, Oral, 2 times daily   levothyroxine  (SYNTHROID ) 25 MCG tablet 1 tablet, Oral, Daily   lisinopril (ZESTRIL) 10 mg, Oral, Daily   MAGNESIUM PO 1 tablet, Oral, Daily   methocarbamol  (ROBAXIN ) 750 MG tablet Take every 8 hours to treat acute migraines. #30 allowed per month.   metoprolol  tartrate (LOPRESSOR ) 50 mg, Oral, 2 times daily   Multiple Vitamins-Minerals (MULTIVITAMIN WITH MINERALS) tablet 1 tablet, Oral, Daily   naloxone (NARCAN) 0.4 mg, Nasal,  Once   Nurtec 75 mg, Oral, Every other day   ondansetron  (ZOFRAN -ODT) 4 mg, Oral, Every 8 hours PRN   pregabalin  (LYRICA ) 25 mg, Oral, Daily   QUEtiapine  (SEROQUEL ) 200 mg, Oral, Daily at bedtime   rizatriptan  (MAXALT -MLT) 10 mg, Oral, As needed   spironolactone (ALDACTONE) 25 mg, Oral, Daily   SUMAtriptan  (IMITREX ) 100 mg, Oral, Daily   Ubrelvy  100 mg, Oral, As needed   venlafaxine  XR (EFFEXOR  XR) 37.5 mg, Oral, Daily with breakfast   Vitamin D3 2,000 Units, Oral, Daily    Diet Orders (From admission, onward)     Start     Ordered   10/01/23 1104  Diet clear liquid Room service appropriate? Yes; Fluid consistency: Thin  Diet effective now       Question Answer Comment  Room service appropriate? Yes   Fluid consistency: Thin      10/01/23 1103            DVT prophylaxis:    Lab Results  Component Value Date   PLT 471 (H) 10/03/2023      Code Status: Full Code  Family Communication: no family at bedside   Status is: Inpatient  Level of care: Telemetry Medical  Consultants:  None   Objective: Vitals:   10/03/23 1631 10/03/23 1948 10/04/23 0434 10/04/23 0822  BP: (!) 148/77 (!) 177/94 (!) 145/78 127/81  Pulse: 78 86 82 91  Resp: 18 17 18    Temp: 97.8 F (36.6 C) 97.9 F (36.6 C) 98.3 F  (36.8 C) 98.1 F (36.7 C)  TempSrc: Oral Oral Oral Oral  SpO2: 99% 96% 94% 97%  Weight:      Height:        Intake/Output Summary (Last 24 hours) at 10/04/2023 0910 Last data filed at 10/04/2023 0300 Gross per 24 hour  Intake 297.33 ml  Output --  Net 297.33 ml   Wt Readings from Last 3 Encounters:  10/03/23 111.9 kg  09/26/23 110.2 kg  11/28/20 105.7 kg    Examination:  Constitutional: NAD Eyes: lids and conjunctivae normal, no scleral icterus ENMT: mmm Neck: normal, supple Respiratory: clear to auscultation bilaterally, no wheezing, no crackles.  Cardiovascular: Regular rate and rhythm, no murmurs / rubs / gallops.  Abdomen: soft, tender throughout, no guarding or rebound   Data Reviewed: I have independently reviewed following labs and imaging studies   CBC Recent Labs  Lab 09/28/23 0500 09/29/23 0551  09/30/23 0642 10/01/23 0808 10/03/23 0448  WBC 11.8* 15.5* 13.1* 10.5 6.5  HGB 10.3* 9.8* 8.9* 8.8* 8.4*  HCT 31.3* 30.2* 27.2* 26.7* 26.3*  PLT 341 352 337 394 471*  MCV 95.7 96.5 96.5 95.7 97.0  MCH 31.5 31.3 31.6 31.5 31.0  MCHC 32.9 32.5 32.7 33.0 31.9  RDW 14.1 14.1 13.8 13.7 13.7    Recent Labs  Lab 09/27/23 1435 09/28/23 0500 09/29/23 0551 09/30/23 0642 10/01/23 0808 10/03/23 0448 10/03/23 1524 10/04/23 0510  NA  --  135 133* 135 136 139  --   --   K  --  4.3 3.9 3.9 3.4* 3.5 3.7  --   CL  --  100 102 100 100 103  --   --   CO2  --  23 22 24 23 26   --   --   GLUCOSE  --  117* 132* 114* 116* 107*  --   --   BUN  --  7 <5* <5* <5* <5*  --   --   CREATININE  --  0.55 0.57 0.53 0.49 0.44  --   --   CALCIUM   --  8.8* 8.5* 8.4* 8.3* 8.6*  --   --   AST  --  50* 51* 38 33 39  --   --   ALT  --  28 34 27 24 26   --   --   ALKPHOS  --  107 116 103 95 81  --   --   BILITOT  --  0.4 0.4 0.5 0.6 0.5  --   --   ALBUMIN  --  2.2* 2.2* 2.0* 2.0* 1.9*  --   --   MG  --   --  1.7 1.7  --  1.7  --  1.8  TSH 1.692  --   --   --   --   --   --   --      ------------------------------------------------------------------------------------------------------------------ No results for input(s): "CHOL", "HDL", "LDLCALC", "TRIG", "CHOLHDL", "LDLDIRECT" in the last 72 hours.   No results found for: "HGBA1C" ------------------------------------------------------------------------------------------------------------------ No results for input(s): "TSH", "T4TOTAL", "T3FREE", "THYROIDAB" in the last 72 hours.  Invalid input(s): "FREET3"   Cardiac Enzymes No results for input(s): "CKMB", "TROPONINI", "MYOGLOBIN" in the last 168 hours.  Invalid input(s): "CK" ------------------------------------------------------------------------------------------------------------------ No results found for: "BNP"  CBG: Recent Labs  Lab 10/03/23 1949 10/04/23 0009 10/04/23 0429  GLUCAP 102* 119* 132*    No results found for this or any previous visit (from the past 240 hours).   Radiology Studies: DG Abd Portable 1V Result Date: 10/03/2023 CLINICAL DATA:  Nasogastric tube placement. EXAM: PORTABLE ABDOMEN - 1 VIEW COMPARISON:  None Available. FINDINGS: Tip of the weighted enteric tube in the right upper quadrant in the region of the distal stomach or proximal duodenum. No bowel dilatation in the included upper abdomen. IMPRESSION: Tip of the weighted enteric tube in the right upper quadrant in the region of the distal stomach or proximal duodenum. Electronically Signed   By: Chadwick Colonel M.D.   On: 10/03/2023 11:48       Kathlen Para, MD, PhD [facility]  Between 7 am - 7 pm I am available, please contact me via Amion (for emergencies) or Securechat (non urgent messages)  Between 7 pm - 7 am I am not available, please contact night coverage MD/APP via Amion

## 2023-10-04 NOTE — Progress Notes (Signed)
 Nutrition Brief Note  INTERVENTION:  Advance tube feeding via Cortrak: Vital1.5 at 50 ml/h (1200 ml per day) Increase to 30ml/hr x6 hours. If tolerates, advance to 37ml/hr and DO NOT ADVANCE  Provides 1880 kcal, 81 gm protein, 917 ml free water daily    Continue Boost Breeze po TID, each supplement provides 250 kcal and 9 grams of protein  Continue MVI w/ minerals Monitor magnesium, potassium, and phosphorus dailyfor at least 3 days Continue thiamine 100 mg daily for 5 days  Monitor diet advancement and tolerance  Pt appears to be tolerating tube feeds at trickle rate. Spoke with GI and attending via secure chat. Amicable to increasing to 30ml/hr for 6 hours then 40ml/hr if she tolerates. Recommend holding at 24ml/hr for today. Can increase to goal rate of 73ml/hr tomorrow if she tolerates. Pt sleeping at time of bedside visit and elected no to wake her. Alerted RN to this update. Will re-assess tolerance tomorrow.   Labs today pending. Continue to assess for refeeding and replete as indicated. RN reporting she can take MVI and thiamine by mouth. Orders updated to reflect this.   NUTRITION DIAGNOSIS:  Inadequate oral intake related to inability to eat, poor appetite, acute illness as evidenced by per patient/family report, estimated needs. - remains applicable   GOAL:  Patient will meet greater than or equal to 90% of their needs - progressing and to be met with tube feeds running at goal rate   MONITOR:  PO intake, TF tolerance, Supplement acceptance  Con Decant MS, RD, LDN Registered Dietitian Clinical Nutrition RD Inpatient Contact Info in Amion

## 2023-10-05 ENCOUNTER — Inpatient Hospital Stay (HOSPITAL_COMMUNITY)

## 2023-10-05 DIAGNOSIS — K859 Acute pancreatitis without necrosis or infection, unspecified: Secondary | ICD-10-CM | POA: Diagnosis not present

## 2023-10-05 LAB — CBC
HCT: 30.8 % — ABNORMAL LOW (ref 36.0–46.0)
Hemoglobin: 9.7 g/dL — ABNORMAL LOW (ref 12.0–15.0)
MCH: 31.1 pg (ref 26.0–34.0)
MCHC: 31.5 g/dL (ref 30.0–36.0)
MCV: 98.7 fL (ref 80.0–100.0)
Platelets: 500 10*3/uL — ABNORMAL HIGH (ref 150–400)
RBC: 3.12 MIL/uL — ABNORMAL LOW (ref 3.87–5.11)
RDW: 13.6 % (ref 11.5–15.5)
WBC: 6.1 10*3/uL (ref 4.0–10.5)
nRBC: 0 % (ref 0.0–0.2)

## 2023-10-05 LAB — COMPREHENSIVE METABOLIC PANEL WITH GFR
ALT: 24 U/L (ref 0–44)
AST: 31 U/L (ref 15–41)
Albumin: 2.3 g/dL — ABNORMAL LOW (ref 3.5–5.0)
Alkaline Phosphatase: 81 U/L (ref 38–126)
Anion gap: 8 (ref 5–15)
BUN: <5 mg/dL — ABNORMAL LOW (ref 6–20)
CO2: 28 mmol/L (ref 22–32)
Calcium: 8.8 mg/dL — ABNORMAL LOW (ref 8.9–10.3)
Chloride: 106 mmol/L (ref 98–111)
Creatinine, Ser: 0.64 mg/dL (ref 0.44–1.00)
GFR, Estimated: >60 mL/min (ref >60)
Glucose, Bld: 110 mg/dL — ABNORMAL HIGH (ref 70–99)
Potassium: 3.6 mmol/L (ref 3.5–5.1)
Sodium: 142 mmol/L (ref 135–145)
Total Bilirubin: <0.2 mg/dL (ref 0.0–1.2)
Total Protein: 6.1 g/dL — ABNORMAL LOW (ref 6.5–8.1)

## 2023-10-05 LAB — GLUCOSE, CAPILLARY
Glucose-Capillary: 105 mg/dL — ABNORMAL HIGH (ref 70–99)
Glucose-Capillary: 113 mg/dL — ABNORMAL HIGH (ref 70–99)
Glucose-Capillary: 124 mg/dL — ABNORMAL HIGH (ref 70–99)
Glucose-Capillary: 130 mg/dL — ABNORMAL HIGH (ref 70–99)
Glucose-Capillary: 136 mg/dL — ABNORMAL HIGH (ref 70–99)
Glucose-Capillary: 137 mg/dL — ABNORMAL HIGH (ref 70–99)
Glucose-Capillary: 92 mg/dL (ref 70–99)

## 2023-10-05 LAB — PHOSPHORUS: Phosphorus: 4.9 mg/dL — ABNORMAL HIGH (ref 2.5–4.6)

## 2023-10-05 LAB — MAGNESIUM: Magnesium: 2.1 mg/dL (ref 1.7–2.4)

## 2023-10-05 MED ORDER — IOHEXOL 350 MG/ML SOLN
120.0000 mL | Freq: Once | INTRAVENOUS | Status: AC | PRN
  Administered 2023-10-05: 120 mL via INTRAVENOUS

## 2023-10-05 MED ORDER — VITAL 1.5 CAL PO LIQD
1000.0000 mL | ORAL | Status: DC
  Administered 2023-10-06 – 2023-10-16 (×10): 1000 mL
  Filled 2023-10-05 (×17): qty 1000

## 2023-10-05 NOTE — Plan of Care (Signed)
  Problem: Education: Goal: Knowledge of General Education information will improve Description: Including pain rating scale, medication(s)/side effects and non-pharmacologic comfort measures Outcome: Progressing   Problem: Health Behavior/Discharge Planning: Goal: Ability to manage health-related needs will improve Outcome: Progressing   Problem: Clinical Measurements: Goal: Will remain free from infection Outcome: Progressing Goal: Diagnostic test results will improve Outcome: Progressing Goal: Respiratory complications will improve Outcome: Progressing Goal: Cardiovascular complication will be avoided Outcome: Progressing   Problem: Activity: Goal: Risk for activity intolerance will decrease Outcome: Progressing   Problem: Coping: Goal: Level of anxiety will decrease Outcome: Progressing   Problem: Elimination: Goal: Will not experience complications related to bowel motility Outcome: Progressing Goal: Will not experience complications related to urinary retention Outcome: Progressing   Problem: Safety: Goal: Ability to remain free from injury will improve Outcome: Progressing   Problem: Skin Integrity: Goal: Risk for impaired skin integrity will decrease Outcome: Progressing   Problem: Clinical Measurements: Goal: Ability to maintain clinical measurements within normal limits will improve Outcome: Not Progressing   Problem: Nutrition: Goal: Adequate nutrition will be maintained Outcome: Not Progressing   Problem: Pain Managment: Goal: General experience of comfort will improve and/or be controlled Outcome: Not Progressing  Tolerating little PO intake, She is tolerating TF at 50. Still with abd pain

## 2023-10-05 NOTE — Progress Notes (Signed)
  Progress Note   Patient: Kendra Parker ZOX:[id number] DOB: 23-Jul-1970 DOA: 09/27/2023     6 DOS: the patient was seen and examined on 10/05/2023   Brief hospital course: 53 year old with HTN, HLD, chronic migraine headaches comes into the hospital with dull abdominal pain for the past 2 and half weeks, radiating into her back. This has been associated with nausea, vomiting. Given persistent symptoms presented to the ER, and a CT scan revealed acute pancreatitis with no necrosis or fluid collection. Right upper quadrant ultrasound showed fatty liver, and gallbladder was unremarkable. She was admitted to the hospital.   Assessment and Plan: Principal problem Acute pancreatitis -continue supportive care, IV fluids, pain control.  CT scan on admission without complicating factors such as abscesses or necrosis.  There is no gallbladder pathology, no cholelithiasis or choledocholithiasis seen on imaging - She is following with neurology as an outpatient and is on Ubrelvy , Vyepti, sumatriptan , none of them highly associated with pancreatitis.  I discontinued sumatriptan  as this could be associated with it - Given lack of improvement in initial couple of days with IV fluids, n.p.o., failure to advance diet gastroenterology consulted.  She eventually had an Dobbhoff placed and now getting trickle tube feeds.  GI considering reimaging, defer to them timing   Active problems Migraine headaches-on multiple medications at home, follows with neurology as an outpatient.    Hyperlipidemia-continue statin   Hypothyroidism-continue Synthroid    Hypertension-continue metoprolol , blood pressure acceptable, elevated at times due to pain   Obesity, class III-BMI greater than 40.  She would benefit from weight loss      Subjective: Still complaining of abd discomfort and decreased appetitie  Physical Exam: Vitals:   10/05/23 0514 10/05/23 0832 10/05/23 1023 10/05/23 1618  BP: (!) 151/87 (!) 154/75  (!) 154/72   Pulse: 73 82  74  Resp: 18 18    Temp: 97.7 F (36.5 C) 97.8 F (36.6 C)  97.6 F (36.4 C)  TempSrc: Oral   Oral  SpO2: 95% 93%  99%  Weight:   (P) 107.5 kg   Height:       General exam: Awake, laying in bed, in nad Respiratory system: Normal respiratory effort, no wheezing Cardiovascular system: regular rate, s1, s2 Gastrointestinal system: Soft, nondistended, decreased BS Central nervous system: CN2-12 grossly intact, strength intact Extremities: Perfused, no clubbing Skin: Normal skin turgor, no notable skin lesions seen Psychiatry: Mood normal // no visual hallucinations   Data Reviewed:  Labs reviewed: Na 142, K 3.6, Cr 0.64, WBC 6.1, Hgb 9.7, Plts 500  Family Communication: Pt in room, family not at bedside  Disposition: Status is: Inpatient Remains inpatient appropriate because: severity of illness  Planned Discharge Destination: Home    Author: Cherylle Corwin, MD 10/05/2023 6:29 PM  For on call review [email or web address].

## 2023-10-05 NOTE — Progress Notes (Signed)
 Nutrition Brief Note  Continues to struggle with abdominal pain s/p  clear liquid intake. Pt tolerating TF at rate of 35ml/hr. Reached out to attending and surgery regarding appropriateness to advance to goal rate of 7ml/hr. Surgery is amicable to this.  Estimated Nutritional Needs:  Kcal:  1700-1900 kcals Protein:  75-90g Fluid:  >1.7L/day  Patient labs stable. Monitoring for refeeding. Will go for CT today. No N/V/C/D endorsed. No edema noted.  INTERVENTION:  Advance tube feeding to goal rate via Cortrak: Vital1.5 at 50 ml/h (1200 ml per day)   Provides 1880 kcal, 81 gm protein, 917 ml free water daily    Discontinue Boost Breeze po TID until able to tolerate oral intake Continue MVI w/ minerals Monitor magnesium, potassium, and phosphorus dailyfor at least 3 days Continue thiamine 100 mg daily for 5 days  Monitor diet advancement and tolerance  Wt Readings from Last 15 Encounters:  10/03/23 111.9 kg  09/26/23 110.2 kg  11/28/20 105.7 kg  07/10/20 101.2 kg  07/26/19 98.2 kg  01/25/19 100.9 kg  07/24/18 95.6 kg  11/21/17 91.9 kg  11/17/17 90.7 kg  09/27/17 92.5 kg  01/13/15 97.1 kg  01/01/15 97.1 kg  07/23/11 89 kg   NUTRITION DIAGNOSIS:  Inadequate oral intake related to inability to eat, poor appetite, acute illness as evidenced by per patient/family report, estimated needs. - remains applicable   GOAL:  Patient will meet greater than or equal to 90% of their needs - progressing and to be met with tube feeds running at goal rate   MONITOR:  PO intake, TF tolerance, Supplement acceptance  Con Decant MS, RD, LDN Registered Dietitian Clinical Nutrition RD Inpatient Contact Info in Amion

## 2023-10-05 NOTE — Progress Notes (Signed)
      Progress Note   Subjective  Patient is about the same. Tolerating higher rate of tube feeds (now at 40cc / hr) but still having difficulty with clear liquids. Pain is the same.    Objective   Vital signs in last 24 hours: Temp:  [97.7 F (36.5 C)-98.1 F (36.7 C)] 97.7 F (36.5 C) (05/14 0514) Pulse Rate:  [71-91] 73 (05/14 0514) Resp:  [18] 18 (05/14 0514) BP: (127-185)/(75-87) 151/87 (05/14 0514) SpO2:  [95 %-99 %] 95 % (05/14 0514) Last BM Date : 10/04/23 General:    white female in NAD Abdomen:  Soft, tenderness to mid and upper abdomen.  Neurologic:  Alert and oriented,  grossly normal neurologically. Psych:  Cooperative. Normal mood and affect.  Intake/Output from previous day: 05/13 0701 - 05/14 0700 In: 825.8 [P.O.:450; I.V.:3; NG/GT:372.8] Out: 1260 [Urine:1100; Emesis/NG output:160] Intake/Output this shift: No intake/output data recorded.  Lab Results: Recent Labs    10/03/23 0448 10/04/23 0510 10/05/23 0453  WBC 6.5 5.4 6.1  HGB 8.4* 9.0* 9.7*  HCT 26.3* 28.0* 30.8*  PLT 471* 490* 500*   BMET Recent Labs    10/03/23 0448 10/03/23 1524 10/05/23 0453  NA 139  --  142  K 3.5 3.7 3.6  CL 103  --  106  CO2 26  --  28  GLUCOSE 107*  --  110*  BUN <5*  --  <5*  CREATININE 0.44  --  0.64  CALCIUM  8.6*  --  8.8*   LFT Recent Labs    10/05/23 0453  PROT 6.1*  ALBUMIN 2.3*  AST 31  ALT 24  ALKPHOS 81  BILITOT <0.2   PT/INR No results for input(s): "LABPROT", "INR" in the last 72 hours.  Studies/Results: DG Abd Portable 1V Result Date: 10/03/2023 CLINICAL DATA:  Nasogastric tube placement. EXAM: PORTABLE ABDOMEN - 1 VIEW COMPARISON:  None Available. FINDINGS: Tip of the weighted enteric tube in the right upper quadrant in the region of the distal stomach or proximal duodenum. No bowel dilatation in the included upper abdomen. IMPRESSION: Tip of the weighted enteric tube in the right upper quadrant in the region of the distal stomach or  proximal duodenum. Electronically Signed   By: Chadwick Colonel M.D.   On: 10/03/2023 11:48       Assessment / Plan:    53 y/o female here with the following:   Acute pancreatitis - idiopathic - med induced? All potential offending meds stopped.   HD#9. She continues to do okay with tube feeds, now vomiting, now rate has been increased to 40cc/hr. Can slowly titrate up as she tolerates. She has not done well with clear liquids, I told her she can stop trying them, the tube feeds are giving her nutrition. Her WBC is okay, normalized, but she has pretty significant pain that has not gotten better. It has been 5 days since last imaging, will do pancreatic protocol CT scan today to evaluate for complications of pancreatitis, enlarging fluid collections, etc. Further recommendations pending that result.   Call with questions.  Christi Coward, MD [facility]astroenter[facility]

## 2023-10-05 NOTE — Hospital Course (Addendum)
 53 year old with HTN, HLD, chronic migraine headaches comes into the hospital with dull abdominal pain for the past 2 and half weeks, radiating into her back. This has been associated with nausea, vomiting. Given persistent symptoms presented to the ER, and a CT scan revealed acute pancreatitis with no necrosis or fluid collection. Right upper quadrant ultrasound showed fatty liver, and gallbladder was unremarkable. She was admitted to the hospital.   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  Assessment and Plan: Acute pancreatitis likely drug-induced Gallbladder sludge. GI have been consulted. CT scan on admission without complicating factors such as abscesses or necrosis.  There is no gallbladder pathology, no cholelithiasis or choledocholithiasis seen on imaging.  There are some sludge seen. She is following with neurology as an outpatient and is on Ubrelvy , Vyepti, sumatriptan . Migraine meds being held incase they may be related to pt's pancreatitis NG tube placed postpyloric.  Was receiving nighttime tube feeding. Cortrak removed on 5/30.  Diet advanced to heart healthy diet.  Patient was educated extensively with regards to importance of low-fat food as she was seen in the room with milkshakes from cookout on 5/30! Continue pain control although minimize narcotics. Discontinue sumatriptan  as per my discussion with patient on 5/28.   Migraine headaches- on multiple medications at home, follows with neurology as an outpatient.  GI initially recommended to hold off on resuming migraine meds given chance that migraine meds may be related to pancreatitis.  Headache had historically improved with reglan , toradol , and benadryl  Discontinue sumatriptan  as there is increased risk of pancreatitis. Continue other medications as home regimen.   Hyperlipidemia- continue statin   Hypothyroidism- continue Synthroid    Hypertension- continue metoprolol , blood pressure  acceptable, elevated at times due to pain   Obesity, class III- Body mass index is 41.63 kg/m.  Outpatient correlation with PCP recommended.  Chronic pain syndrome Has been on Lyrica . increase the dose to 3 times daily.

## 2023-10-06 DIAGNOSIS — D649 Anemia, unspecified: Secondary | ICD-10-CM | POA: Diagnosis not present

## 2023-10-06 DIAGNOSIS — R160 Hepatomegaly, not elsewhere classified: Secondary | ICD-10-CM | POA: Diagnosis not present

## 2023-10-06 DIAGNOSIS — K859 Acute pancreatitis without necrosis or infection, unspecified: Secondary | ICD-10-CM | POA: Diagnosis not present

## 2023-10-06 LAB — COMPREHENSIVE METABOLIC PANEL WITH GFR
ALT: 21 U/L (ref 0–44)
AST: 27 U/L (ref 15–41)
Albumin: 2.5 g/dL — ABNORMAL LOW (ref 3.5–5.0)
Alkaline Phosphatase: 81 U/L (ref 38–126)
Anion gap: 9 (ref 5–15)
BUN: 7 mg/dL (ref 6–20)
CO2: 27 mmol/L (ref 22–32)
Calcium: 8.8 mg/dL — ABNORMAL LOW (ref 8.9–10.3)
Chloride: 103 mmol/L (ref 98–111)
Creatinine, Ser: 0.63 mg/dL (ref 0.44–1.00)
GFR, Estimated: >60 mL/min (ref >60)
Glucose, Bld: 113 mg/dL — ABNORMAL HIGH (ref 70–99)
Potassium: 3.8 mmol/L (ref 3.5–5.1)
Sodium: 139 mmol/L (ref 135–145)
Total Bilirubin: 0.4 mg/dL (ref 0.0–1.2)
Total Protein: 6 g/dL — ABNORMAL LOW (ref 6.5–8.1)

## 2023-10-06 LAB — CBC
HCT: 29.6 % — ABNORMAL LOW (ref 36.0–46.0)
Hemoglobin: 9.4 g/dL — ABNORMAL LOW (ref 12.0–15.0)
MCH: 31.1 pg (ref 26.0–34.0)
MCHC: 31.8 g/dL (ref 30.0–36.0)
MCV: 98 fL (ref 80.0–100.0)
Platelets: 491 10*3/uL — ABNORMAL HIGH (ref 150–400)
RBC: 3.02 MIL/uL — ABNORMAL LOW (ref 3.87–5.11)
RDW: 13.6 % (ref 11.5–15.5)
WBC: 7.4 10*3/uL (ref 4.0–10.5)
nRBC: 0 % (ref 0.0–0.2)

## 2023-10-06 LAB — GLUCOSE, CAPILLARY
Glucose-Capillary: 116 mg/dL — ABNORMAL HIGH (ref 70–99)
Glucose-Capillary: 147 mg/dL — ABNORMAL HIGH (ref 70–99)
Glucose-Capillary: 174 mg/dL — ABNORMAL HIGH (ref 70–99)
Glucose-Capillary: 82 mg/dL (ref 70–99)
Glucose-Capillary: 123 mg/dL — ABNORMAL HIGH (ref 70–99)

## 2023-10-06 LAB — MAGNESIUM: Magnesium: 2 mg/dL (ref 1.7–2.4)

## 2023-10-06 LAB — PHOSPHORUS: Phosphorus: 5.7 mg/dL — ABNORMAL HIGH (ref 2.5–4.6)

## 2023-10-06 NOTE — Consult Note (Signed)
 Daily Progress Note  DOA: 09/27/2023 Hospital Day: 10   Cc:  Acute pancreatitis  Brief History:  53 y.o. year old female with a medical history not necessarily limited to migraines, HTN, hypothyroidism, obesity, chronic neck / shoulder pain. Admitted 5/6 with N/V and abdominal pain and acute pancreatitis. See 5/9 GI consult note.   ASSESSMENT    Acute pancreatitis, ? Medication related CT scan with pancreatic protocol yesterday showed  mild increase in peripancreatic fluid. No pseudocysts or abscess . Reactive inflammatory changes involving proximal small bowel in the RUQ   [place] anemia, ? Chronic Hgb in Nov 2024 was 11.5 Hgb now in 9 range in setting IV fluids   Hepatomegaly / possible steatosis.  Normal LFTs   [facility] of colon cancer in father at age 20   PLAN   --IR to adjust small bowel feeding tube as CT scan shows it has folded back on itself with tip being in the stomach. Tube feeds off for now.   --Advised her to hold off on any PO intake for now since it is making pain worse.   Subjective   Still having generalized upper abdominal pain. Pain gets worse with clear liquids.   Objective    Recent Labs    10/04/23 0510 10/05/23 0453 10/06/23 0515  WBC 5.4 6.1 7.4  HGB 9.0* 9.7* 9.4*  HCT 28.0* 30.8* 29.6*  MCV 98.2 98.7 98.0  PLT 490* 500* 491*   No results for input(s): "FOLATE", "VITAMINB12", "FERRITIN", "TIBC", "IRONPCTSAT" in the last 72 hours. Recent Labs    10/03/23 1524 10/05/23 0453 10/06/23 0515  NA  --  142 139  K 3.7 3.6 3.8  CL  --  106 103  CO2  --  28 27  GLUCOSE  --  110* 113*  BUN  --  <5* 7  CREATININE  --  0.64 0.63  CALCIUM   --  8.8* 8.8*   Recent Labs    10/05/23 0453 10/06/23 0515  PROT 6.1* 6.0*  ALBUMIN 2.3* 2.5*  AST 31 27  ALT 24 21  ALKPHOS 81 81  BILITOT <0.2 0.4      Imaging:  CT PANCREAS ABD W/WO CLINICAL DATA:  Prolonged course of acute, severe pancreatitis. Clinical concern for  complications.  EXAM: CT ABDOMEN WITHOUT AND WITH CONTRAST  TECHNIQUE: Multidetector CT imaging of the abdomen was performed following the standard protocol before and following the bolus administration of intravenous contrast.  RADIATION DOSE REDUCTION: This exam was performed according to the departmental dose-optimization program which includes automated exposure control, adjustment of the mA and/or kV according to patient size and/or use of iterative reconstruction technique.  CONTRAST:  OMNIPAQUE  IOHEXOL  350 MG/ML SOLN  COMPARISON:  09/26/2023 and abdomen MR with MRCP dated 09/30/2023.  FINDINGS: Lower chest: Normal sized heart. Minimal bibasilar atelectasis. Minimal left pleural effusion.  Hepatobiliary: Heterogeneous hepatic steatosis. Interval sludge in the dependent portion of the gallbladder. No gallbladder wall thickening or pericholecystic fluid.  Pancreas: Better defined pancreatic margins. Mild increase in peripancreatic fluid with no walled-off fluid collections to indicate pseudocyst or abscess formation at this time.  Spleen: Normal in size without focal abnormality.  Adrenals/Urinary Tract: Unremarkable adrenal glands, kidneys and proximal ureters.  Stomach/Bowel: Feeding tube extending through the stomach and into the proximal duodenum and folded back upon itself with its tip in the distal stomach. Normal caliber small bowel and colon. Low-density wall thickening involving the proximal small bowel in the right upper quadrant of  the abdomen in the area peripancreatic edematous changes.  Vascular/Lymphatic: Mild atheromatous aortic calcifications. No enlarged lymph nodes.  Other: None.  Musculoskeletal: Mild lumbar and lower thoracic spine degenerative changes.  IMPRESSION: 1. Mild increase in peripancreatic fluid with no walled-off fluid collections to indicate pseudocyst or abscess formation at this time. 2. Low-density wall thickening  involving the proximal small bowel in the right upper quadrant of the abdomen in the area of peripancreatic edematous changes, consistent with reactive inflammatory changes. 3. Heterogeneous hepatic steatosis. 4. Interval sludge in the dependent portion of the gallbladder. 5. Minimal left pleural effusion. 6. Minimal bibasilar atelectasis. 7. Aortic atherosclerosis.  Aortic Atherosclerosis (ICD10-I70.0).  Electronically Signed   By: Catherin Closs M.D.   On: 10/05/2023 14:11     Scheduled inpatient medications:   clonazePAM   1 mg Oral BID   DULoxetine   30 mg Oral QHS   DULoxetine   60 mg Oral QHS   enoxaparin  (LOVENOX ) injection  50 mg Subcutaneous Q24H   feeding supplement  1 Container Oral TID BM   levETIRAcetam   500 mg Oral BID   levothyroxine   25 mcg Oral Daily   metoprolol  tartrate  50 mg Oral BID   multivitamin with minerals  1 tablet Oral Daily   pantoprazole (PROTONIX) IV  40 mg Intravenous QHS   polyethylene glycol  17 g Oral BID   pregabalin   25 mg Oral Daily   QUEtiapine   200 mg Oral QHS   senna-docusate  1 tablet Oral BID   sodium chloride  flush  3 mL Intravenous Q12H   thiamine  100 mg Oral Daily   venlafaxine  XR  75 mg Oral Q breakfast   Continuous inpatient infusions:   feeding supplement (VITAL 1.5 CAL) 50 mL/hr at 10/05/23 1752   PRN inpatient medications: acetaminophen  **OR** acetaminophen , acetaminophen  **AND** diphenhydrAMINE , albuterol , bisacodyl, HYDROmorphone  (DILAUDID ) injection, methocarbamol , mouth rinse, oxyCODONE   Vital signs in last 24 hours: Temp:  [97.6 F (36.4 C)-98.3 F (36.8 C)] 97.9 F (36.6 C) (05/15 0820) Pulse Rate:  [74-82] 82 (05/15 0820) Resp:  [18] 18 (05/15 0820) BP: (124-154)/(72-86) 144/79 (05/15 0820) SpO2:  [93 %-99 %] 99 % (05/15 0820) Weight:  [113.1 kg] 113.1 kg (05/15 0543) Last BM Date : 10/05/23  Intake/Output Summary (Last 24 hours) at 10/06/2023 1138 Last data filed at 10/06/2023 0942 Gross per 24 hour  Intake  1722.34 ml  Output 1900 ml  Net -177.66 ml    Intake/Output from previous day: 05/14 0701 - 05/15 0700 In: 1522.3 [P.O.:640; I.V.:3; NG/GT:879.3] Out: 1100 [Urine:1100] Intake/Output this shift: Total I/O In: 200 [P.O.:200] Out: 800 [Urine:800]   Physical Exam:  General: Alert female in NAD Heart:  Regular rate and rhythm.  Pulmonary: Normal respiratory effort Abdomen: Soft, nondistended, mild moderate mid upper and LUQ tenderness.  Neurologic: Alert and oriented Psych: Pleasant. Cooperative     LOS: 7 days   Mai Schwalbe ,NP 10/06/2023, 11:38 AM

## 2023-10-06 NOTE — Progress Notes (Addendum)
  Progress Note   Patient: Kendra Parker AOZ:[id number] DOB: 1970/06/03 DOA: 09/27/2023     7 DOS: the patient was seen and examined on 10/06/2023   Brief hospital course: 53 year old with HTN, HLD, chronic migraine headaches comes into the hospital with dull abdominal pain for the past 2 and half weeks, radiating into her back. This has been associated with nausea, vomiting. Given persistent symptoms presented to the ER, and a CT scan revealed acute pancreatitis with no necrosis or fluid collection. Right upper quadrant ultrasound showed fatty liver, and gallbladder was unremarkable. She was admitted to the hospital.   Assessment and Plan: Principal problem Acute pancreatitis -continuing supportive care.  CT scan on admission without complicating factors such as abscesses or necrosis.  There is no gallbladder pathology, no cholelithiasis or choledocholithiasis seen on imaging - She is following with neurology as an outpatient and is on Ubrelvy , Vyepti, sumatriptan , none of them highly associated with pancreatitis. Discontinued sumatriptan  as this could be associated with it - Given lack of improvement in initial couple of days with IV fluids, n.p.o., failure to advance diet gastroenterology was consulted.  Now with coretrak tube feed. Anticipate slow recovery.Discussed with GI -Coretrak is up in stomach. Consulted IR to adjust tube placement.   Active problems Migraine headaches-on multiple medications at home, follows with neurology as an outpatient.    Hyperlipidemia-continue statin   Hypothyroidism-continue Synthroid    Hypertension-continue metoprolol , blood pressure acceptable, elevated at times due to pain   Obesity, class III-BMI greater than 40.  She would benefit from weight loss      Subjective: Still complaining of abd discomfort and decreased appetitie  Physical Exam: Vitals:   10/05/23 1618 10/05/23 2009 10/06/23 0543 10/06/23 0820  BP: (!) 154/72 (!) 142/86 124/73 (!) 144/79   Pulse: 74 79 76 82  Resp:  18 18 18   Temp: 97.6 F (36.4 C) 98.3 F (36.8 C) 98 F (36.7 C) 97.9 F (36.6 C)  TempSrc: Oral Oral Oral Oral  SpO2: 99% 93% 97% 99%  Weight:   113.1 kg   Height:       General exam: Conversant, appears uncomfortable Respiratory system: normal chest rise, clear, no audible wheezing Cardiovascular system: regular rhythm, s1-s2 Gastrointestinal system: Nondistended, pos bs Central nervous system: No seizures, no tremors Extremities: No cyanosis, no joint deformities Skin: No rashes, no pallor Psychiatry: Affect normal // no auditory hallucinations   Data Reviewed:  Labs reviewed: Na 139, K 3.8, Cr 0.63, WBC 7.4, Hgb 9.4, Plts 491  Family Communication: Pt in room, family not at bedside  Disposition: Status is: Inpatient Remains inpatient appropriate because: severity of illness  Planned Discharge Destination: Home    Author: Cherylle Corwin, MD 10/06/2023 4:26 PM  For on call review [email or web address].

## 2023-10-06 NOTE — Plan of Care (Signed)
   Problem: Education: Goal: Knowledge of General Education information will improve Description: Including pain rating scale, medication(s)/side effects and non-pharmacologic comfort measures Outcome: Progressing   Problem: Health Behavior/Discharge Planning: Goal: Ability to manage health-related needs will improve Outcome: Progressing   Problem: Clinical Measurements: Goal: Ability to maintain clinical measurements within normal limits will improve Outcome: Progressing   Problem: Activity: Goal: Risk for activity intolerance will decrease Outcome: Progressing   Problem: Nutrition: Goal: Adequate nutrition will be maintained Outcome: Progressing   Problem: Coping: Goal: Level of anxiety will decrease Outcome: Progressing   Problem: Pain Managment: Goal: General experience of comfort will improve and/or be controlled Outcome: Progressing

## 2023-10-07 ENCOUNTER — Inpatient Hospital Stay (HOSPITAL_COMMUNITY)

## 2023-10-07 DIAGNOSIS — K859 Acute pancreatitis without necrosis or infection, unspecified: Secondary | ICD-10-CM | POA: Diagnosis not present

## 2023-10-07 LAB — CBC
HCT: 30.5 % — ABNORMAL LOW (ref 36.0–46.0)
Hemoglobin: 9.5 g/dL — ABNORMAL LOW (ref 12.0–15.0)
MCH: 30.6 pg (ref 26.0–34.0)
MCHC: 31.1 g/dL (ref 30.0–36.0)
MCV: 98.4 fL (ref 80.0–100.0)
Platelets: 434 10*3/uL — ABNORMAL HIGH (ref 150–400)
RBC: 3.1 MIL/uL — ABNORMAL LOW (ref 3.87–5.11)
RDW: 13.4 % (ref 11.5–15.5)
WBC: 4.8 10*3/uL (ref 4.0–10.5)
nRBC: 0 % (ref 0.0–0.2)

## 2023-10-07 LAB — GLUCOSE, CAPILLARY
Glucose-Capillary: 114 mg/dL — ABNORMAL HIGH (ref 70–99)
Glucose-Capillary: 115 mg/dL — ABNORMAL HIGH (ref 70–99)
Glucose-Capillary: 118 mg/dL — ABNORMAL HIGH (ref 70–99)
Glucose-Capillary: 129 mg/dL — ABNORMAL HIGH (ref 70–99)
Glucose-Capillary: 138 mg/dL — ABNORMAL HIGH (ref 70–99)
Glucose-Capillary: 140 mg/dL — ABNORMAL HIGH (ref 70–99)

## 2023-10-07 LAB — COMPREHENSIVE METABOLIC PANEL WITH GFR
ALT: 26 U/L (ref 0–44)
AST: 30 U/L (ref 15–41)
Albumin: 2.6 g/dL — ABNORMAL LOW (ref 3.5–5.0)
Alkaline Phosphatase: 81 U/L (ref 38–126)
Anion gap: 10 (ref 5–15)
BUN: 7 mg/dL (ref 6–20)
CO2: 26 mmol/L (ref 22–32)
Calcium: 9 mg/dL (ref 8.9–10.3)
Chloride: 104 mmol/L (ref 98–111)
Creatinine, Ser: 0.57 mg/dL (ref 0.44–1.00)
GFR, Estimated: >60 mL/min (ref >60)
Glucose, Bld: 114 mg/dL — ABNORMAL HIGH (ref 70–99)
Potassium: 4 mmol/L (ref 3.5–5.1)
Sodium: 140 mmol/L (ref 135–145)
Total Bilirubin: 0.3 mg/dL (ref 0.0–1.2)
Total Protein: 6.2 g/dL — ABNORMAL LOW (ref 6.5–8.1)

## 2023-10-07 MED ORDER — BANATROL TF EN LIQD
60.0000 mL | Freq: Two times a day (BID) | ENTERAL | Status: DC
  Administered 2023-10-07 – 2023-10-21 (×16): 60 mL
  Filled 2023-10-07 (×29): qty 60

## 2023-10-07 MED ORDER — SODIUM CHLORIDE (PF) 0.9 % IJ SOLN
INTRAMUSCULAR | Status: AC
  Administered 2023-10-07: 10 mL
  Filled 2023-10-07: qty 10

## 2023-10-07 MED ORDER — IOHEXOL 300 MG/ML  SOLN
25.0000 mL | Freq: Once | INTRAMUSCULAR | Status: AC | PRN
  Administered 2023-10-07: 25 mL

## 2023-10-07 NOTE — Progress Notes (Addendum)
  Progress Note   Patient: Kendra Parker UJW:[id number] DOB: 05-Jul-1970 DOA: 09/27/2023     8 DOS: the patient was seen and examined on 10/07/2023   Brief hospital course: 53 year old with HTN, HLD, chronic migraine headaches comes into the hospital with dull abdominal pain for the past 2 and half weeks, radiating into her back. This has been associated with nausea, vomiting. Given persistent symptoms presented to the ER, and a CT scan revealed acute pancreatitis with no necrosis or fluid collection. Right upper quadrant ultrasound showed fatty liver, and gallbladder was unremarkable. She was admitted to the hospital.   Assessment and Plan: Principal problem Acute pancreatitis -continuing supportive care.  CT scan on admission without complicating factors such as abscesses or necrosis.  There is no gallbladder pathology, no cholelithiasis or choledocholithiasis seen on imaging - She is following with neurology as an outpatient and is on Ubrelvy , Vyepti, sumatriptan , none of them highly associated with pancreatitis. Discontinued sumatriptan  as this could be associated with it - Given lack of improvement in initial couple of days with IV fluids, n.p.o., failure to advance diet gastroenterology was consulted.  Now with coretrak tube feed. Anticipate slow recovery. -Tip of coretrak has been repositioned  -Having worse pain this AM. GI recs noted for only sips of clears for comfort, but primarily strict NPO otherwise   Active problems Migraine headaches-on multiple medications at home, follows with neurology as an outpatient.  -discussed with GI. Recommendation to hold off on resuming migraine meds given chance that migraine meds may be related to pancreatitis.    Hyperlipidemia-continue statin   Hypothyroidism-continue Synthroid    Hypertension-continue metoprolol , blood pressure acceptable, elevated at times due to pain   Obesity, class III-BMI greater than 40.  She would benefit from weight loss       Subjective: Having worse abd pain this AM  Physical Exam: Vitals:   10/06/23 2030 10/06/23 2033 10/07/23 0404 10/07/23 0828  BP: (!) 130/105  136/83 127/84  Pulse: 70  75 78  Resp: 18  18 19   Temp:  97.9 F (36.6 C) 98.4 F (36.9 C) 98.2 F (36.8 C)  TempSrc:  Oral Oral   SpO2: 95%  99% 99%  Weight:   109 kg   Height:       General exam: Awake, laying in bed, in nad Respiratory system: Normal respiratory effort, no wheezing Cardiovascular system: regular rate, s1, s2 Gastrointestinal system: Soft, nondistended Central nervous system: CN2-12 grossly intact, strength intact Extremities: Perfused, no clubbing Skin: Normal skin turgor, no notable skin lesions seen Psychiatry: Mood normal // no visual hallucinations   Data Reviewed:  Labs reviewed: Na 140, K 4.0, Cr 0.57, WBC 4.8, Hgb 9.5, Plts 434  Family Communication: Pt in room, family at bedside  Disposition: Status is: Inpatient Remains inpatient appropriate because: severity of illness  Planned Discharge Destination: Home    Author: Cherylle Corwin, MD 10/07/2023 5:14 PM  For on call review [email or web address].

## 2023-10-07 NOTE — Progress Notes (Signed)
 Daily Progress Note  DOA: 09/27/2023 Hospital Day: 68  Cc:  Acute pancreatitis   Brief History:  53 y.o. year old female with a medical history not necessarily limited to migraines, HTN, hypothyroidism, obesity, chronic neck / shoulder pain. Admitted 5/6 with N/V and abdominal pain and acute pancreatitis. See 5/9 GI consult note.  ASSESSMENT    Acute pancreatitis, ? Medication related. Had sludge in gallbladder on CT scan / mildly elevated LFTs . Microlithiasis may have caused pancreatitis.  CT scan with pancreatic protocol 5/14 showed  mild increase in peripancreatic fluid / reactive inflammatory changes involving proximal small bowel in the RUQ.  No pseudocysts or abscess .  Today:  Having a lot of upper abdominal pain ( more than yesterday) despite IV and PO pain meds. Small bowel feedings going at 50 ml / hr. Still taking clears though says PO intake causes worsening pain. Has had some "explosive" BMs ( probably related to tube feeds) Normal WBC, afebrile. Having migraines  Migraines Currently untreated as there has been concern that pancreatitis could have been related to one of her migraine medications.    [place] anemia, ? Chronic Hgb in Nov 2024 was 11.5 Hgb now in 9 range but stable   Hepatomegaly / possible steatosis.  Minor elevation in AST earlier this admission which may not even be of liver origin.     [facility] of colon cancer in father at age 36   PLAN   --Continue tube feeds, supportive care --Again I recommended she take only sips of clear liquids for comfort.  --Since there is at least some suspicion that pancreatitis could be medication induced, would not resume any migraines meds she was previously taking.  --Consider cholecystectomy once recovered.   Subjective   Having a lot of mid upper and LUQ abdominal pain - worse than yesterday. Liquids exacerbate the pain. Explosive BMs. Having migraines   Objective   Recent Labs    10/05/23 0453 10/06/23 0515  10/07/23 0628  WBC 6.1 7.4 4.8  HGB 9.7* 9.4* 9.5*  HCT 30.8* 29.6* 30.5*  MCV 98.7 98.0 98.4  PLT 500* 491* 434*   Recent Labs    10/05/23 0453 10/06/23 0515 10/07/23 0628  NA 142 139 140  K 3.6 3.8 4.0  CL 106 103 104  CO2 28 27 26   GLUCOSE 110* 113* 114*  BUN <5* 7 7  CREATININE 0.64 0.63 0.57  CALCIUM  8.8* 8.8* 9.0   Recent Labs    10/05/23 0453 10/06/23 0515 10/07/23 0628  PROT 6.1* 6.0* 6.2*  ALBUMIN 2.3* 2.5* 2.6*  AST 31 27 30   ALT 24 21 26   ALKPHOS 81 81 81  BILITOT <0.2 0.4 0.3      Imaging:  DG Abd 1 View CLINICAL DATA:  Feeding tube placement.  EXAM: ABDOMEN - 1 VIEW; IR NASO/ORO GTUBE THRU DUO - REPOSITION  COMPARISON:  None Available.  FINDINGS: Intermittent fluoroscopy was used to assist with feeding tube placement. Feeding tube tip is transpyloric, in the distal duodenum near the ligament of Treitz. Tip position was confirmed by injection of a small amount of enteric contrast.  IMPRESSION: Feeding tube tip is transpyloric, in the distal duodenum near the ligament of Treitz. Feeding tube is ready for use.  Electronically Signed   By: Donnal Fusi M.D.   On: 10/07/2023 09:00 DG Naso/Oro Gtube Thru Duo-Reposition CLINICAL DATA:  Feeding tube placement.  EXAM: ABDOMEN - 1 VIEW; IR NASO/ORO GTUBE THRU DUO - REPOSITION  COMPARISON:  None Available.  FINDINGS: Intermittent fluoroscopy was used to assist with feeding tube placement. Feeding tube tip is transpyloric, in the distal duodenum near the ligament of Treitz. Tip position was confirmed by injection of a small amount of enteric contrast.  IMPRESSION: Feeding tube tip is transpyloric, in the distal duodenum near the ligament of Treitz. Feeding tube is ready for use.  Electronically Signed   By: Donnal Fusi M.D.   On: 10/07/2023 09:00     Scheduled inpatient medications:   clonazePAM   1 mg Oral BID   DULoxetine   30 mg Oral QHS   DULoxetine   60 mg Oral QHS    enoxaparin  (LOVENOX ) injection  50 mg Subcutaneous Q24H   feeding supplement  1 Container Oral TID BM   levETIRAcetam   500 mg Oral BID   levothyroxine   25 mcg Oral Daily   metoprolol  tartrate  50 mg Oral BID   multivitamin with minerals  1 tablet Oral Daily   pantoprazole (PROTONIX) IV  40 mg Intravenous QHS   polyethylene glycol  17 g Oral BID   pregabalin   25 mg Oral Daily   QUEtiapine   200 mg Oral QHS   senna-docusate  1 tablet Oral BID   sodium chloride  flush  3 mL Intravenous Q12H   venlafaxine  XR  75 mg Oral Q breakfast   Continuous inpatient infusions:   feeding supplement (VITAL 1.5 CAL) 1,000 mL (10/07/23 1241)   PRN inpatient medications: acetaminophen  **OR** acetaminophen , acetaminophen  **AND** diphenhydrAMINE , albuterol , bisacodyl, HYDROmorphone  (DILAUDID ) injection, methocarbamol , mouth rinse, oxyCODONE   Vital signs in last 24 hours: Temp:  [97.6 F (36.4 C)-98.4 F (36.9 C)] 98.2 F (36.8 C) (05/16 0828) Pulse Rate:  [70-78] 78 (05/16 0828) Resp:  [18-19] 19 (05/16 0828) BP: (127-152)/(83-105) 127/84 (05/16 0828) SpO2:  [95 %-99 %] 99 % (05/16 0828) Weight:  [109 kg] 109 kg (05/16 0404) Last BM Date : 10/05/23  Intake/Output Summary (Last 24 hours) at 10/07/2023 1332 Last data filed at 10/07/2023 1121 Gross per 24 hour  Intake 553 ml  Output 200 ml  Net 353 ml    Intake/Output from previous day: 05/15 0701 - 05/16 0700 In: 600 [P.O.:600] Out: 800 [Urine:800] Intake/Output this shift: Total I/O In: 153 [P.O.:150; I.V.:3] Out: 200 [Urine:200]   Physical Exam:  General: Alert female in NAD Heart:  Regular rate and rhythm.  Pulmonary: Normal respiratory effort Abdomen: Soft, nondistended, moderate mid upper / LUQ tenderness.  Normal bowel sounds. Extremities: No lower extremity edema  Neurologic: Alert and oriented Psych: Pleasant. Cooperative     LOS: 8 days   Mai Schwalbe ,NP 10/07/2023, 1:32 PM

## 2023-10-07 NOTE — Progress Notes (Addendum)
 Nutrition Brief Note  Tube was able to be advanced post-pyloric today by IR. Feeding tube tip is transpyloric, in the distal duodenum near the ligament of Treitz. Tube feeds continue to run at goal rate. Some "explosive" diarrhea today. Will add Banatrol BID as stool bulking agent. Continues with complaints of upper abdominal pain w/ PO intake, especially clear liquids.   Estimated Nutritional Needs:  Kcal:  1700-1900 kcals Protein:  75-90g Fluid:  >1.7L/day   CT scan on 5/14 showed mild increase in peripancreatic fluid/reactive inflammatory changes involving proximal small bowel in RUQ. No psuedocysts or abscesses.    INTERVENTION:  Continue TF via Cortrak: Vital1.5 at 50 ml/h (1200 ml per day)   Provides 1880 kcal, 81 gm protein, 917 ml free water daily    Continue MVI w/ minerals Add Banatrol BID-provides 45kcal, 5g soluble fiber and 2g protein per serving.  Continue thiamine 100 mg daily for 5 days  Monitor diet advancement and tolerance  NUTRITION DIAGNOSIS:  Inadequate oral intake related to inability to eat, poor appetite, acute illness as evidenced by per patient/family report, estimated needs. - remains applicable   GOAL:  Patient will meet greater than or equal to 90% of their needs - progressing and to be met with tube feeds running at goal rate   MONITOR:  PO intake, TF tolerance, Supplement acceptance    Con Decant MS, RD, LDN Registered Dietitian Clinical Nutrition RD Inpatient Contact Info in Amion

## 2023-10-07 NOTE — Plan of Care (Signed)
  Problem: Education: Goal: Knowledge of General Education information will improve Description: Including pain rating scale, medication(s)/side effects and non-pharmacologic comfort measures Outcome: Progressing   Problem: Health Behavior/Discharge Planning: Goal: Ability to manage health-related needs will improve Outcome: Progressing   Problem: Clinical Measurements: Goal: Ability to maintain clinical measurements within normal limits will improve Outcome: Progressing Goal: Will remain free from infection Outcome: Progressing Goal: Diagnostic test results will improve Outcome: Progressing Goal: Respiratory complications will improve Outcome: Progressing Goal: Cardiovascular complication will be avoided Outcome: Progressing   Problem: Activity: Goal: Risk for activity intolerance will decrease Outcome: Progressing   Problem: Coping: Goal: Level of anxiety will decrease Outcome: Progressing   Problem: Elimination: Goal: Will not experience complications related to urinary retention Outcome: Progressing   Problem: Pain Managment: Goal: General experience of comfort will improve and/or be controlled Outcome: Progressing   Problem: Safety: Goal: Ability to remain free from injury will improve Outcome: Progressing   Problem: Skin Integrity: Goal: Risk for impaired skin integrity will decrease Outcome: Progressing   Problem: Nutrition: Goal: Adequate nutrition will be maintained Outcome: Not Progressing   Problem: Elimination: Goal: Will not experience complications related to bowel motility Outcome: Not Progressing  Tolerating tube feeds but not oral intake other than small amount of clears. + diarrhea

## 2023-10-08 DIAGNOSIS — R1013 Epigastric pain: Secondary | ICD-10-CM | POA: Diagnosis not present

## 2023-10-08 DIAGNOSIS — K859 Acute pancreatitis without necrosis or infection, unspecified: Secondary | ICD-10-CM | POA: Diagnosis not present

## 2023-10-08 DIAGNOSIS — R7989 Other specified abnormal findings of blood chemistry: Secondary | ICD-10-CM | POA: Diagnosis not present

## 2023-10-08 LAB — CBC
HCT: 31.7 % — ABNORMAL LOW (ref 36.0–46.0)
Hemoglobin: 10.2 g/dL — ABNORMAL LOW (ref 12.0–15.0)
MCH: 31.1 pg (ref 26.0–34.0)
MCHC: 32.2 g/dL (ref 30.0–36.0)
MCV: 96.6 fL (ref 80.0–100.0)
Platelets: 449 10*3/uL — ABNORMAL HIGH (ref 150–400)
RBC: 3.28 MIL/uL — ABNORMAL LOW (ref 3.87–5.11)
RDW: 13.3 % (ref 11.5–15.5)
WBC: 5.5 10*3/uL (ref 4.0–10.5)
nRBC: 0 % (ref 0.0–0.2)

## 2023-10-08 LAB — COMPREHENSIVE METABOLIC PANEL WITH GFR
ALT: 26 U/L (ref 0–44)
AST: 27 U/L (ref 15–41)
Albumin: 2.8 g/dL — ABNORMAL LOW (ref 3.5–5.0)
Alkaline Phosphatase: 82 U/L (ref 38–126)
Anion gap: 9 (ref 5–15)
BUN: 6 mg/dL (ref 6–20)
CO2: 27 mmol/L (ref 22–32)
Calcium: 9 mg/dL (ref 8.9–10.3)
Chloride: 102 mmol/L (ref 98–111)
Creatinine, Ser: 0.55 mg/dL (ref 0.44–1.00)
GFR, Estimated: >60 mL/min (ref >60)
Glucose, Bld: 117 mg/dL — ABNORMAL HIGH (ref 70–99)
Potassium: 4 mmol/L (ref 3.5–5.1)
Sodium: 138 mmol/L (ref 135–145)
Total Bilirubin: 0.5 mg/dL (ref 0.0–1.2)
Total Protein: 6.6 g/dL (ref 6.5–8.1)

## 2023-10-08 LAB — GLUCOSE, CAPILLARY
Glucose-Capillary: 116 mg/dL — ABNORMAL HIGH (ref 70–99)
Glucose-Capillary: 113 mg/dL — ABNORMAL HIGH (ref 70–99)
Glucose-Capillary: 101 mg/dL — ABNORMAL HIGH (ref 70–99)
Glucose-Capillary: 133 mg/dL — ABNORMAL HIGH (ref 70–99)

## 2023-10-08 MED ORDER — METOCLOPRAMIDE HCL 5 MG/ML IJ SOLN
5.0000 mg | Freq: Once | INTRAMUSCULAR | Status: AC
  Administered 2023-10-08: 5 mg via INTRAVENOUS
  Filled 2023-10-08: qty 2

## 2023-10-08 MED ORDER — LACTATED RINGERS IV SOLN: INTRAVENOUS | Status: AC

## 2023-10-08 MED ORDER — DIPHENHYDRAMINE HCL 50 MG/ML IJ SOLN
25.0000 mg | Freq: Once | INTRAMUSCULAR | Status: AC
  Administered 2023-10-08: 25 mg via INTRAVENOUS
  Filled 2023-10-08: qty 1

## 2023-10-08 MED ORDER — KETOROLAC TROMETHAMINE 30 MG/ML IJ SOLN
30.0000 mg | Freq: Once | INTRAMUSCULAR | Status: AC
  Administered 2023-10-08: 30 mg via INTRAVENOUS
  Filled 2023-10-08: qty 1

## 2023-10-08 NOTE — Progress Notes (Signed)
 Noted that feeding tube was accidentally pulled out last night. IR consult placed for feeding tube replacement. Discussed with IR. These tubes typically are not placed on weekends, thus plan for feeding tube placement Monday. For now, will continue pt on 125cc/hr basal IVF

## 2023-10-08 NOTE — Plan of Care (Signed)

## 2023-10-08 NOTE — Progress Notes (Signed)
  Progress Note   Patient: Kendra Parker XBM:[id number] DOB: 05/08/1971 DOA: 09/27/2023     9 DOS: the patient was seen and examined on 10/08/2023   Brief hospital course: 53 year old with HTN, HLD, chronic migraine headaches comes into the hospital with dull abdominal pain for the past 2 and half weeks, radiating into her back. This has been associated with nausea, vomiting. Given persistent symptoms presented to the ER, and a CT scan revealed acute pancreatitis with no necrosis or fluid collection. Right upper quadrant ultrasound showed fatty liver, and gallbladder was unremarkable. She was admitted to the hospital.   Assessment and Plan: Principal problem Acute pancreatitis -continuing supportive care.  CT scan on admission without complicating factors such as abscesses or necrosis.  There is no gallbladder pathology, no cholelithiasis or choledocholithiasis seen on imaging - She is following with neurology as an outpatient and is on Ubrelvy , Vyepti, sumatriptan , none of them highly associated with pancreatitis. Discontinued sumatriptan  as this could be associated with it - Given lack of improvement in initial couple of days with IV fluids, n.p.o., failure to advance diet gastroenterology was consulted.  -Coretrak was placed for tube feed, however tube was accidentally pulled out last night. Plan tube replacement 5/19 with IR.  -Cont on 125cc/hr basal IVF for now   Active problems Migraine headaches-on multiple medications at home, follows with neurology as an outpatient.  -discussed with GI. Recommendation to hold off on resuming migraine meds given chance that migraine meds may be related to pancreatitis.  -Headache now improved with reglan, toradol , and benadryl  x1. Discussed with pharmacy   Hyperlipidemia-continue statin   Hypothyroidism-continue Synthroid    Hypertension-continue metoprolol , blood pressure acceptable, elevated at times due to pain   Obesity, class III-BMI greater than  40.  She would benefit from weight loss      Subjective: Complaining of continued epigastric abd pain  Physical Exam: Vitals:   10/08/23 0437 10/08/23 0855 10/08/23 0855 10/08/23 1708  BP: 118/69 98/68 98/68  126/85  Pulse: 72 73 73 75  Resp: 19 19    Temp: 99 F (37.2 C) 97.7 F (36.5 C) 97.7 F (36.5 C) 98.2 F (36.8 C)  TempSrc: Oral Oral    SpO2: 95% 96% 96% 99%  Weight:      Height:       General exam: Conversant, in no acute distress Respiratory system: normal chest rise, clear, no audible wheezing Cardiovascular system: regular rhythm, s1-s2 Gastrointestinal system: Nondistended, nontender, pos BS Central nervous system: No seizures, no tremors Extremities: No cyanosis, no joint deformities Skin: No rashes, no pallor Psychiatry: Affect normal // no auditory hallucinations   Data Reviewed:  Labs reviewed: Na 138, K 4.0, Cr 0.55, WBC 5.5, hgb 10.2, Plts 449  Family Communication: Pt in room, family at bedside  Disposition: Status is: Inpatient Remains inpatient appropriate because: severity of illness  Planned Discharge Destination: Home    Author: Cherylle Corwin, MD 10/08/2023 5:24 PM  For on call review [email or web address].

## 2023-10-08 NOTE — Progress Notes (Signed)
 Daily Progress Note  DOA: 09/27/2023 Hospital Day: 12   Cc:  Acute pancreatitis  Brief History:  53 y.o. year old female with a medical history not necessarily limited to migraines, HTN, hypothyroidism, obesity, chronic neck / shoulder pain. Admitted 5/6 with N/V and abdominal pain and acute pancreatitis. See 5/9 GI consult note.   ASSESSMENT    Acute pancreatitis, ? Medication related. Had sludge in gallbladder on CT scan / mildly elevated LFTs . Microlithiasis is another consideration.   CT scan with pancreatic protocol 5/14 showed  mild increase in peripancreatic fluid / reactive inflammatory changes involving proximal small bowel in the RUQ.  No pseudocysts or abscess .  Today Improvement has been slow. She continues to have upper abdominal pain. Unfortunately she inadvertently dislodged the Cortrak and it can not be replaced until Monday. WBC 5, temp 99   PLAN   --Replace Cortrak on Monday. Continue IVF in interim. She did drink some Boost.    Subjective   Still has significant upper abdominal pain.  Having some loose stools with enteral feedings  Objective     Recent Labs    10/06/23 0515 10/07/23 0628 10/08/23 0506  WBC 7.4 4.8 5.5  HGB 9.4* 9.5* 10.2*  HCT 29.6* 30.5* 31.7*  MCV 98.0 98.4 96.6  PLT 491* 434* 449*   No results for input(s): "FOLATE", "VITAMINB12", "FERRITIN", "TIBC", "IRONPCTSAT" in the last 72 hours. Recent Labs    10/06/23 0515 10/07/23 0628 10/08/23 0506  NA 139 140 138  K 3.8 4.0 4.0  CL 103 104 102  CO2 27 26 27   GLUCOSE 113* 114* 117*  BUN 7 7 6   CREATININE 0.63 0.57 0.55  CALCIUM  8.8* 9.0 9.0   Recent Labs    10/06/23 0515 10/07/23 0628 10/08/23 0506  PROT 6.0* 6.2* 6.6  ALBUMIN 2.5* 2.6* 2.8*  AST 27 30 27   ALT 21 26 26   ALKPHOS 81 81 82  BILITOT 0.4 0.3 0.5      Imaging:  DG Abd 1 View CLINICAL DATA:  Feeding tube placement.  EXAM: ABDOMEN - 1 VIEW; IR NASO/ORO GTUBE THRU DUO -  REPOSITION  COMPARISON:  None Available.  FINDINGS: Intermittent fluoroscopy was used to assist with feeding tube placement. Feeding tube tip is transpyloric, in the distal duodenum near the ligament of Treitz. Tip position was confirmed by injection of a small amount of enteric contrast.  IMPRESSION: Feeding tube tip is transpyloric, in the distal duodenum near the ligament of Treitz. Feeding tube is ready for use.  Electronically Signed   By: Donnal Fusi M.D.   On: 10/07/2023 09:00 DG Naso/Oro Gtube Thru Duo-Reposition CLINICAL DATA:  Feeding tube placement.  EXAM: ABDOMEN - 1 VIEW; IR NASO/ORO GTUBE THRU DUO - REPOSITION  COMPARISON:  None Available.  FINDINGS: Intermittent fluoroscopy was used to assist with feeding tube placement. Feeding tube tip is transpyloric, in the distal duodenum near the ligament of Treitz. Tip position was confirmed by injection of a small amount of enteric contrast.  IMPRESSION: Feeding tube tip is transpyloric, in the distal duodenum near the ligament of Treitz. Feeding tube is ready for use.  Electronically Signed   By: Donnal Fusi M.D.   On: 10/07/2023 09:00     Scheduled inpatient medications:   clonazePAM   1 mg Oral BID   DULoxetine   30 mg Oral QHS   DULoxetine   60 mg Oral QHS   enoxaparin  (LOVENOX ) injection  50 mg Subcutaneous Q24H   feeding  supplement  1 Container Oral TID BM   fiber supplement (BANATROL TF)  60 mL Per Tube BID   levETIRAcetam   500 mg Oral BID   levothyroxine   25 mcg Oral Daily   metoprolol  tartrate  50 mg Oral BID   multivitamin with minerals  1 tablet Oral Daily   pantoprazole  (PROTONIX ) IV  40 mg Intravenous QHS   polyethylene glycol  17 g Oral BID   pregabalin   25 mg Oral Daily   QUEtiapine   200 mg Oral QHS   senna-docusate  1 tablet Oral BID   sodium chloride  flush  3 mL Intravenous Q12H   venlafaxine  XR  75 mg Oral Q breakfast   Continuous inpatient infusions:   feeding supplement (VITAL  1.5 CAL) Stopped (10/08/23 0229)   lactated ringers  125 mL/hr at 10/08/23 1340   PRN inpatient medications: acetaminophen  **OR** acetaminophen , acetaminophen  **AND** diphenhydrAMINE , albuterol , bisacodyl , HYDROmorphone  (DILAUDID ) injection, methocarbamol , mouth rinse, oxyCODONE   Vital signs in last 24 hours: Temp:  [97.7 F (36.5 C)-99 F (37.2 C)] 97.7 F (36.5 C) (05/17 0855) Pulse Rate:  [71-83] 73 (05/17 0855) Resp:  [19] 19 (05/17 0855) BP: (98-153)/(68-101) 98/68 (05/17 0855) SpO2:  [95 %-97 %] 96 % (05/17 0855) Last BM Date : 10/07/23  Intake/Output Summary (Last 24 hours) at 10/08/2023 1612 Last data filed at 10/08/2023 0914 Gross per 24 hour  Intake 1438 ml  Output 1000 ml  Net 438 ml    Intake/Output from previous day: 05/16 0701 - 05/17 0700 In: 1671 [P.O.:1270; I.V.:3; NG/GT:398] Out: 1200 [Urine:1200] Intake/Output this shift: Total I/O In: 120 [P.O.:120] Out: -    Physical Exam:  General: Alert female, no distress Heart:  Regular rate and rhythm.  Pulmonary: Normal respiratory effort Abdomen: Soft, diffusely tender to palpation in the epigastrium normal bowel sounds. Extremities: No lower extremity edema  Neurologic: Alert and oriented   LOS: 9 days    Eugenia Hess, MD

## 2023-10-08 NOTE — Plan of Care (Signed)
   Problem: Education: Goal: Knowledge of General Education information will improve Description: Including pain rating scale, medication(s)/side effects and non-pharmacologic comfort measures Outcome: Completed/Met

## 2023-10-08 NOTE — Progress Notes (Signed)
 Patient pulled her small bore tube feeding while asleep. Will update MD.

## 2023-10-09 DIAGNOSIS — K859 Acute pancreatitis without necrosis or infection, unspecified: Secondary | ICD-10-CM | POA: Diagnosis not present

## 2023-10-09 LAB — CBC
HCT: 33.8 % — ABNORMAL LOW (ref 36.0–46.0)
Hemoglobin: 10.7 g/dL — ABNORMAL LOW (ref 12.0–15.0)
MCH: 30.6 pg (ref 26.0–34.0)
MCHC: 31.7 g/dL (ref 30.0–36.0)
MCV: 96.6 fL (ref 80.0–100.0)
Platelets: 438 10*3/uL — ABNORMAL HIGH (ref 150–400)
RBC: 3.5 MIL/uL — ABNORMAL LOW (ref 3.87–5.11)
RDW: 13.3 % (ref 11.5–15.5)
WBC: 5.2 10*3/uL (ref 4.0–10.5)
nRBC: 0 % (ref 0.0–0.2)

## 2023-10-09 LAB — COMPREHENSIVE METABOLIC PANEL WITH GFR
ALT: 24 U/L (ref 0–44)
AST: 30 U/L (ref 15–41)
Albumin: 2.8 g/dL — ABNORMAL LOW (ref 3.5–5.0)
Alkaline Phosphatase: 81 U/L (ref 38–126)
Anion gap: 9 (ref 5–15)
BUN: 5 mg/dL — ABNORMAL LOW (ref 6–20)
CO2: 26 mmol/L (ref 22–32)
Calcium: 9.4 mg/dL (ref 8.9–10.3)
Chloride: 106 mmol/L (ref 98–111)
Creatinine, Ser: 0.52 mg/dL (ref 0.44–1.00)
GFR, Estimated: >60 mL/min (ref >60)
Glucose, Bld: 123 mg/dL — ABNORMAL HIGH (ref 70–99)
Potassium: 4 mmol/L (ref 3.5–5.1)
Sodium: 141 mmol/L (ref 135–145)
Total Bilirubin: 0.4 mg/dL (ref 0.0–1.2)
Total Protein: 6.5 g/dL (ref 6.5–8.1)

## 2023-10-09 LAB — GLUCOSE, CAPILLARY
Glucose-Capillary: 100 mg/dL — ABNORMAL HIGH (ref 70–99)
Glucose-Capillary: 101 mg/dL — ABNORMAL HIGH (ref 70–99)
Glucose-Capillary: 111 mg/dL — ABNORMAL HIGH (ref 70–99)

## 2023-10-09 NOTE — Progress Notes (Signed)
  Progress Note   Patient: Kendra Parker YNW:[id number] DOB: 10/16/1970 DOA: 09/27/2023     10 DOS: the patient was seen and examined on 10/09/2023   Brief hospital course: 53 year old with HTN, HLD, chronic migraine headaches comes into the hospital with dull abdominal pain for the past 2 and half weeks, radiating into her back. This has been associated with nausea, vomiting. Given persistent symptoms presented to the ER, and a CT scan revealed acute pancreatitis with no necrosis or fluid collection. Right upper quadrant ultrasound showed fatty liver, and gallbladder was unremarkable. She was admitted to the hospital.   Assessment and Plan: Principal problem Acute pancreatitis -continuing supportive care.  CT scan on admission without complicating factors such as abscesses or necrosis.  There is no gallbladder pathology, no cholelithiasis or choledocholithiasis seen on imaging - She is following with neurology as an outpatient and is on Ubrelvy , Vyepti, sumatriptan , none of them highly associated with pancreatitis. Discontinued sumatriptan  as this could be associated with it - Given lack of improvement in initial couple of days with IV fluids, n.p.o., failure to advance diet gastroenterology was consulted.  -Coretrak was placed for tube feed, however tube was accidentally pulled out last night. Plan tube replacement 5/19 with IR.  -Cont on 125cc/hr basal IVF for now   Active problems Migraine headaches-on multiple medications at home, follows with neurology as an outpatient.  -discussed with GI. Recommendation to hold off on resuming migraine meds given chance that migraine meds may be related to pancreatitis.  -Headache now improved with reglan, toradol , and benadryl  x1.   Hyperlipidemia-continue statin   Hypothyroidism-continue Synthroid    Hypertension-continue metoprolol , blood pressure acceptable, elevated at times due to pain   Obesity, class III-BMI greater than 40.  She would benefit  from weight loss       Subjective: complaining of suprapubic pain this AM. Just under 500cc on bladder scan, with pt voiding 600cc and <100cc post-void residual  Physical Exam: Vitals:   10/08/23 1936 10/09/23 0515 10/09/23 0810 10/09/23 1642  BP: (!) 149/90 139/84 137/65 134/85  Pulse: 77 64 73 79  Resp:  16    Temp: 98.4 F (36.9 C) 98.2 F (36.8 C)    TempSrc:      SpO2: 99% 97% 98% 99%  Weight:      Height:       General exam: Awake, laying in bed, in nad Respiratory system: Normal respiratory effort, no wheezing Cardiovascular system: regular rate, s1, s2 Gastrointestinal system: Soft, nondistended, tenderness throughout, mainly over suprapubic region Central nervous system: CN2-12 grossly intact, strength intact Extremities: Perfused, no clubbing Skin: Normal skin turgor, no notable skin lesions seen Psychiatry: Mood normal // no visual hallucinations   Data Reviewed:  Labs reviewed: Na 141, K 4.0, Cr 0.52, WBC 5.2, hgb 10.7   Family Communication: Pt in room, family at bedside  Disposition: Status is: Inpatient Remains inpatient appropriate because: severity of illness  Planned Discharge Destination: Home    Author: Cherylle Corwin, MD 10/09/2023 5:49 PM  For on call review [email or web address].

## 2023-10-09 NOTE — Plan of Care (Signed)

## 2023-10-10 ENCOUNTER — Inpatient Hospital Stay (HOSPITAL_COMMUNITY)

## 2023-10-10 DIAGNOSIS — K859 Acute pancreatitis without necrosis or infection, unspecified: Secondary | ICD-10-CM | POA: Diagnosis not present

## 2023-10-10 DIAGNOSIS — G8929 Other chronic pain: Secondary | ICD-10-CM | POA: Diagnosis not present

## 2023-10-10 LAB — GLUCOSE, CAPILLARY
Glucose-Capillary: 108 mg/dL — ABNORMAL HIGH (ref 70–99)
Glucose-Capillary: 145 mg/dL — ABNORMAL HIGH (ref 70–99)
Glucose-Capillary: 128 mg/dL — ABNORMAL HIGH (ref 70–99)
Glucose-Capillary: 146 mg/dL — ABNORMAL HIGH (ref 70–99)
Glucose-Capillary: 156 mg/dL — ABNORMAL HIGH (ref 70–99)
Glucose-Capillary: 112 mg/dL — ABNORMAL HIGH (ref 70–99)

## 2023-10-10 LAB — COMPREHENSIVE METABOLIC PANEL WITH GFR
ALT: 26 U/L (ref 0–44)
AST: 33 U/L (ref 15–41)
Albumin: 3.1 g/dL — ABNORMAL LOW (ref 3.5–5.0)
Alkaline Phosphatase: 95 U/L (ref 38–126)
Anion gap: 11 (ref 5–15)
BUN: 5 mg/dL — ABNORMAL LOW (ref 6–20)
CO2: 25 mmol/L (ref 22–32)
Calcium: 9.9 mg/dL (ref 8.9–10.3)
Chloride: 103 mmol/L (ref 98–111)
Creatinine, Ser: 0.68 mg/dL (ref 0.44–1.00)
GFR, Estimated: >60 mL/min (ref >60)
Glucose, Bld: 110 mg/dL — ABNORMAL HIGH (ref 70–99)
Potassium: 4.4 mmol/L (ref 3.5–5.1)
Sodium: 139 mmol/L (ref 135–145)
Total Bilirubin: 0.5 mg/dL (ref 0.0–1.2)
Total Protein: 7.2 g/dL (ref 6.5–8.1)

## 2023-10-10 LAB — CBC
HCT: 35.2 % — ABNORMAL LOW (ref 36.0–46.0)
Hemoglobin: 11.2 g/dL — ABNORMAL LOW (ref 12.0–15.0)
MCH: 30.9 pg (ref 26.0–34.0)
MCHC: 31.8 g/dL (ref 30.0–36.0)
MCV: 97.2 fL (ref 80.0–100.0)
Platelets: 489 10*3/uL — ABNORMAL HIGH (ref 150–400)
RBC: 3.62 MIL/uL — ABNORMAL LOW (ref 3.87–5.11)
RDW: 13.2 % (ref 11.5–15.5)
WBC: 6.4 10*3/uL (ref 4.0–10.5)
nRBC: 0 % (ref 0.0–0.2)

## 2023-10-10 LAB — C-REACTIVE PROTEIN: CRP: 1.6 mg/dL — ABNORMAL HIGH (ref <1.0)

## 2023-10-10 MED ORDER — BOOST / RESOURCE BREEZE PO LIQD CUSTOM
1.0000 | Freq: Three times a day (TID) | ORAL | Status: DC
  Administered 2023-10-11 – 2023-10-21 (×20): 1 via ORAL

## 2023-10-10 MED ORDER — IOHEXOL 300 MG/ML  SOLN
20.0000 mL | Freq: Once | INTRAMUSCULAR | Status: AC | PRN
  Administered 2023-10-10: 20 mL

## 2023-10-10 MED ORDER — LIDOCAINE VISCOUS HCL 2 % MT SOLN
3.0000 mL | Freq: Once | OROMUCOSAL | Status: AC
  Administered 2023-10-10: 3 mL via OROMUCOSAL

## 2023-10-10 MED ORDER — PANTOPRAZOLE SODIUM 40 MG PO TBEC
40.0000 mg | DELAYED_RELEASE_TABLET | Freq: Every day | ORAL | Status: DC
  Administered 2023-10-10 – 2023-10-18 (×9): 40 mg via ORAL
  Filled 2023-10-10 (×10): qty 1

## 2023-10-10 NOTE — Plan of Care (Signed)
   Problem: Nutrition: Goal: Adequate nutrition will be maintained Outcome: Not Progressing

## 2023-10-10 NOTE — Progress Notes (Signed)
 Nutrition Follow-up  DOCUMENTATION CODES:   Morbid obesity  INTERVENTION:  Continue TF via small bore feeding tube: Vital1.5 at 50 ml/h (1200 ml per day)   Provides 1880 kcal, 81 gm protein, 917 ml free water daily    Continue MVI w/ minerals Continue Banatrol BID-provides 45kcal, 5g soluble fiber and 2g protein per serving.  Monitor diet advancement and tolerance Discontinue Boost Breeze as causing GI upset and patient refusing   NUTRITION DIAGNOSIS:  Inadequate oral intake related to inability to eat, poor appetite, acute illness as evidenced by per patient/family report, estimated needs. - remains applicable  GOAL:  Patient will meet greater than or equal to 90% of their needs - being met via TF  MONITOR:  PO intake, TF tolerance, Supplement acceptance  REASON FOR ASSESSMENT:  Consult Assessment of nutrition requirement/status, Enteral/tube feeding initiation and management  ASSESSMENT:   Pt with PMH significant for: HTN, HLD, chronic migraine headaches presented to hospital w/ abdominal pain x2.5 weeks. Reported it radiating into her back accompanied by N/V. CT revealed acute pancreatitis. RUG ultrasound showed fatty liver.  5/6 admitted, clear liquid diet 5/8 NPO  5/9 MRI/MRCP: markedly increased moderate peripancreatic edema w/ areas of relatively ill-defined fluid signal seen 5/10 clear liquid diet 5/12 Cortrak placed - unable to get post-pyloric; trickle TF initiated 5/13 TF advanced 5/16 Cortrak advanced by IR to post pyloric  5/17 Cortrak dislodged 5/19 Cortrak replaced, TF re-initiated   Continue to consumed clear liquid diet with upper abdominal pain. Cortak dislodged over the weekend. Replaced this morning. New c/o suprapubic pain yesterday. No significant findings s/p bladder scan.  Average Meal Intake 5/16: 50% x2 documented meals 5/17: 25-100% x3 documented meals 5/18: 0-50% x2 documented meals   Continues with abdominal pain s/p consumption of  clear liquids. Will d/c Boost Breeze for now, given refusing d/t GI upset. Can re-start TF at goal rate.    Admit Weight: 110kg Current Weight: 103.6kg   Has shown 5% wt loss in last three days. This is questionable, however would be expected with negligible intake. Will re-assess s/p next weight collection. No edema noted. Two bowel movements noted yesterday.  Drains/Lines: Small bore feeding tube (duodenum) placed 5/19 UOP: + 2 unmeasured occurrences   Meds: clonazepan, levothyroxine , MVI, pantoprazole , Miralax , senna-docusate   Labs:  Na+ 139 (wdl) K+ 4.4 (wdl) CBGs 110-123 x24 hours  Diet Order:   Diet Order             Diet clear liquid Room service appropriate? Yes; Fluid consistency: Thin  Diet effective now            EDUCATION NEEDS:  Education needs have been addressed  Skin:  Skin Assessment: Reviewed RN Assessment  Last BM:  5/11 - type 3/7 x2  Height:  Ht Readings from Last 1 Encounters:  09/27/23 5\' 3"  (1.6 m)   Weight:  Wt Readings from Last 1 Encounters:  10/10/23 103.6 kg   Ideal Body Weight:  52.3 kg  BMI:  Body mass index is 40.46 kg/m.  Estimated Nutritional Needs:   Kcal:  1700-1900 kcals  Protein:  75-90g  Fluid:  >1.7L/day  Con Decant MS, RD, LDN Registered Dietitian Clinical Nutrition RD Inpatient Contact Info in Amion

## 2023-10-10 NOTE — Progress Notes (Signed)
 Cortrak Tube Team Note:  Consult received to place a Bridle on a small bore tube placed by radiology. Tube was secured at 80 cm in the right nare.    Frederik Jansky, RD Registered Dietitian  See Amion for more information

## 2023-10-10 NOTE — Progress Notes (Signed)
 Patient ID: Kendra Parker, female   DOB: [birth year], 53 y.o.   MRN: [id number]    Progress Note   Subjective   Day # 14 CC; acute pancreatitis, unclear etiology, possibly medication induced  Core track tube replaced this a.m.-to resume tube feedings at goal rate of 50 cc/h  Labs today--WBC 6.4/hemoglobin 11.2/hematocrit 35.2 Potassium 4.4/BUN 5/creatinine 0.68 Albumin 3.1 LFTs within normal limits Last CT 10/05/2022-better defined pancreatic margins, mild increase in peripancreatic fluid, no walled off fluid collections to indicate pseudocyst or abscess, hepatic steatosis, sludge in the dependent portion of the gallbladder, low-density wall thickening involving the proximal small bowel in the right upper quadrant in the area of peripancreatic edematous changes consistent with reactive inflammation.  Patient says that she has been able to be up and ambulate in the hallway, no complaints of shortness of breath, has been trying some clear liquids, and keeping them down though has not had a tray as yet.  She continues to complain of severe upper abdominal pain, rates it as a 7-8 out of 10 but also says she has not had any pain medication at all today and that she has been trying to stretch out the interval.  She is asking about how much longer she might need to be in the hospital.   Objective   Vital signs in last 24 hours: Temp:  [97.7 F (36.5 C)-97.9 F (36.6 C)] 97.9 F (36.6 C) (05/19 0909) Pulse Rate:  [71-84] 78 (05/19 0909) Resp:  [14-16] 14 (05/19 0503) BP: (117-141)/(79-85) 141/79 (05/19 0909) SpO2:  [94 %-99 %] 94 % (05/19 0909) Weight:  [103.6 kg] 103.6 kg (05/19 0500) Last BM Date : 10/09/23 General: Older white female in NAD Heart:  Regular rate and rhythm; no murmurs Lungs: Respirations even and unlabored, lungs CTA bilaterally Abdomen:  Soft, remains tender across the upper abdomen some guarding no rebound no palpable mass or hepatosplenomegaly. Normal bowel  sounds. Extremities:  Without edema. Neurologic:  Alert and oriented,  grossly normal neurologically. Psych:  Cooperative. Normal mood and affect.  Intake/Output from previous day: 05/18 0701 - 05/19 0700 In: 360 [P.O.:360] Out: 600 [Urine:600] Intake/Output this shift: No intake/output data recorded.  Lab Results: Recent Labs    10/08/23 0506 10/09/23 0756 10/10/23 0534  WBC 5.5 5.2 6.4  HGB 10.2* 10.7* 11.2*  HCT 31.7* 33.8* 35.2*  PLT 449* 438* 489*   BMET Recent Labs    10/08/23 0506 10/09/23 0756 10/10/23 0534  NA 138 141 139  K 4.0 4.0 4.4  CL 102 106 103  CO2 27 26 25   GLUCOSE 117* 123* 110*  BUN 6 5* 5*  CREATININE 0.55 0.52 0.68  CALCIUM  9.0 9.4 9.9   LFT Recent Labs    10/10/23 0534  PROT 7.2  ALBUMIN 3.1*  AST 33  ALT 26  ALKPHOS 95  BILITOT 0.5   PT/INR No results for input(s): "LABPROT", "INR" in the last 72 hours.  Studies/Results: DG Abd 1 View Result Date: 10/10/2023 CLINICAL DATA:  Feeding tube placement. EXAM: ABDOMEN - 1 VIEW COMPARISON:  None Available. FINDINGS: Single intraoperative fluoroscopic spot image provided. The total fluoroscopic time is 1 minutes 54 seconds with a cumulative air Karma of 18.2 mGy. Feeding tube in the region of the duodenum. IMPRESSION: Feeding tube in the region of the duodenum. Electronically Signed   By: Angus Bark M.D.   On: 10/10/2023 09:42       Assessment / Plan:    #54 53 year old female with  acute moderate to severe pancreatitis-unclear etiology-possibly medication induced, several meds discontinued on admission IgG4 within normal limits  Continues to have complaints of his fairly severe abdominal pain otherwise is stable. Resuming core track feedings today, also allowing clear liquid tray and boost. Encouraged her to take more of the clear liquids in hopes that we can discontinue tube feeding soon.  Plan; out of bed and ambulating is much as possible Clear liquid tray, add boost in  between meals-if can consistently take p.o.'s can discontinue tube feedings in the next couple of days. Her barriers to discharge are pain management and oral intake. Hopefully can work on converting her to oral pain medications  in the next couple of days GI will continue to follow with you.      Principal Problem:   Pancreatitis Active Problems:   Migraine   Insomnia   Dyspnea   Essential hypertension   Sinus tachycardia   Constipation   Chronic pain   Hypothyroidism   Dyslipidemia   Obesity, Class III, BMI 40-49.9 (morbid obesity)   Epigastric pain   Nausea without vomiting     LOS: 11 days   Riyan Gavina EsterwoodPA-C  10/10/2023, 12:42 PM

## 2023-10-10 NOTE — Progress Notes (Signed)
  Progress Note   Patient: Kendra Parker OZH:[id number] DOB: [birth year] DOA: 09/27/2023     11 DOS: the patient was seen and examined on 10/10/2023   Brief hospital course: 53 year old with HTN, HLD, chronic migraine headaches comes into the hospital with dull abdominal pain for the past 2 and half weeks, radiating into her back. This has been associated with nausea, vomiting. Given persistent symptoms presented to the ER, and a CT scan revealed acute pancreatitis with no necrosis or fluid collection. Right upper quadrant ultrasound showed fatty liver, and gallbladder was unremarkable. She was admitted to the hospital.   Assessment and Plan: Principal problem Acute pancreatitis -continuing supportive care.  CT scan on admission without complicating factors such as abscesses or necrosis.  There is no gallbladder pathology, no cholelithiasis or choledocholithiasis seen on imaging - She is following with neurology as an outpatient and is on Ubrelvy , Vyepti, sumatriptan , none of them highly associated with pancreatitis. Discontinued sumatriptan  as this could be associated with it - Given lack of improvement in initial couple of days with IV fluids, n.p.o., failure to advance diet gastroenterology was consulted.  - NG tube replaced 5/19 after it was recently pulled out -now diet advance to clears. Possible transition to oral pain meds in the next couple days   Active problems Migraine headaches-on multiple medications at home, follows with neurology as an outpatient.  -discussed with GI. Recommendation to hold off on resuming migraine meds given chance that migraine meds may be related to pancreatitis.  -Headache now improved with reglan , toradol , and benadryl  x1.   Hyperlipidemia-continue statin   Hypothyroidism-continue Synthroid    Hypertension-continue metoprolol , blood pressure acceptable, elevated at times due to pain   Obesity, class III-BMI greater than 40.  She would benefit from weight  loss       Subjective: still having abd pain, but was able to tolerate water by mouth  Physical Exam: Vitals:   10/10/23 0500 10/10/23 0503 10/10/23 0909 10/10/23 1658  BP:  117/79 (!) 141/79 128/77  Pulse:  71 78 86  Resp:  14  18  Temp:  97.7 F (36.5 C) 97.9 F (36.6 C) 97.7 F (36.5 C)  TempSrc:  Oral Oral   SpO2:  97% 94% 93%  Weight: 103.6 kg     Height:       General exam: Conversant, in no acute distress Respiratory system: normal chest rise, clear, no audible wheezing Cardiovascular system: regular rhythm, s1-s2 Gastrointestinal system: Nondistended, nontender, pos BS Central nervous system: No seizures, no tremors Extremities: No cyanosis, no joint deformities Skin: No rashes, no pallor Psychiatry: Affect normal // no auditory hallucinations   Data Reviewed:  Labs reviewed: Na 139, K 4.4, Cr 0.68, WBC 6.4, Hgb 11.2   Family Communication: Pt in room, family not at bedside  Disposition: Status is: Inpatient Remains inpatient appropriate because: severity of illness  Planned Discharge Destination: Home    Author: Cherylle Corwin, MD 10/10/2023 5:53 PM  For on call review [email or web address].

## 2023-10-11 DIAGNOSIS — G43E09 Chronic migraine with aura, not intractable, without status migrainosus: Secondary | ICD-10-CM | POA: Diagnosis not present

## 2023-10-11 DIAGNOSIS — K859 Acute pancreatitis without necrosis or infection, unspecified: Secondary | ICD-10-CM | POA: Diagnosis not present

## 2023-10-11 LAB — COMPREHENSIVE METABOLIC PANEL WITH GFR
ALT: 32 U/L (ref 0–44)
AST: 33 U/L (ref 15–41)
Albumin: 2.9 g/dL — ABNORMAL LOW (ref 3.5–5.0)
Alkaline Phosphatase: 97 U/L (ref 38–126)
Anion gap: 8 (ref 5–15)
BUN: 10 mg/dL (ref 6–20)
CO2: 25 mmol/L (ref 22–32)
Calcium: 9.4 mg/dL (ref 8.9–10.3)
Chloride: 104 mmol/L (ref 98–111)
Creatinine, Ser: 0.76 mg/dL (ref 0.44–1.00)
GFR, Estimated: >60 mL/min (ref >60)
Glucose, Bld: 136 mg/dL — ABNORMAL HIGH (ref 70–99)
Potassium: 4 mmol/L (ref 3.5–5.1)
Sodium: 137 mmol/L (ref 135–145)
Total Bilirubin: 0.4 mg/dL (ref 0.0–1.2)
Total Protein: 6.8 g/dL (ref 6.5–8.1)

## 2023-10-11 LAB — CBC
HCT: 34 % — ABNORMAL LOW (ref 36.0–46.0)
Hemoglobin: 10.6 g/dL — ABNORMAL LOW (ref 12.0–15.0)
MCH: 30.1 pg (ref 26.0–34.0)
MCHC: 31.2 g/dL (ref 30.0–36.0)
MCV: 96.6 fL (ref 80.0–100.0)
Platelets: 458 10*3/uL — ABNORMAL HIGH (ref 150–400)
RBC: 3.52 MIL/uL — ABNORMAL LOW (ref 3.87–5.11)
RDW: 13.2 % (ref 11.5–15.5)
WBC: 5.8 10*3/uL (ref 4.0–10.5)
nRBC: 0 % (ref 0.0–0.2)

## 2023-10-11 LAB — GLUCOSE, CAPILLARY
Glucose-Capillary: 114 mg/dL — ABNORMAL HIGH (ref 70–99)
Glucose-Capillary: 116 mg/dL — ABNORMAL HIGH (ref 70–99)
Glucose-Capillary: 127 mg/dL — ABNORMAL HIGH (ref 70–99)
Glucose-Capillary: 141 mg/dL — ABNORMAL HIGH (ref 70–99)
Glucose-Capillary: 151 mg/dL — ABNORMAL HIGH (ref 70–99)
Glucose-Capillary: 174 mg/dL — ABNORMAL HIGH (ref 70–99)

## 2023-10-11 LAB — HEMOGLOBIN A1C
Hgb A1c MFr Bld: 5.5 % (ref 4.8–5.6)
Mean Plasma Glucose: 111.15 mg/dL

## 2023-10-11 MED ORDER — NYSTATIN 100000 UNIT/GM EX CREA
TOPICAL_CREAM | Freq: Two times a day (BID) | CUTANEOUS | Status: AC
  Filled 2023-10-11 (×2): qty 30

## 2023-10-11 MED ORDER — NYSTATIN 100000 UNIT/GM EX CREA: TOPICAL_CREAM | Freq: Three times a day (TID) | CUTANEOUS | Status: DC

## 2023-10-11 MED ORDER — METOCLOPRAMIDE HCL 5 MG/ML IJ SOLN
10.0000 mg | Freq: Once | INTRAMUSCULAR | Status: AC
Start: 2023-10-11 — End: 2023-10-11
  Administered 2023-10-11: 10 mg via INTRAVENOUS
  Filled 2023-10-11: qty 2

## 2023-10-11 MED ORDER — DIPHENHYDRAMINE HCL 50 MG/ML IJ SOLN
12.5000 mg | Freq: Once | INTRAMUSCULAR | Status: AC
  Administered 2023-10-11: 12.5 mg via INTRAVENOUS
  Filled 2023-10-11: qty 1

## 2023-10-11 MED ORDER — METOCLOPRAMIDE HCL 5 MG/ML IJ SOLN
10.0000 mg | Freq: Once | INTRAMUSCULAR | Status: AC
  Administered 2023-10-11: 10 mg via INTRAVENOUS
  Filled 2023-10-11: qty 2

## 2023-10-11 MED ORDER — INSULIN ASPART 100 UNIT/ML IJ SOLN
0.0000 [IU] | INTRAMUSCULAR | Status: DC
  Administered 2023-10-11: 2 [IU] via SUBCUTANEOUS
  Administered 2023-10-11 – 2023-10-13 (×8): 1 [IU] via SUBCUTANEOUS
  Administered 2023-10-13 (×2): 2 [IU] via SUBCUTANEOUS
  Administered 2023-10-14: 1 [IU] via SUBCUTANEOUS
  Administered 2023-10-14 (×3): 2 [IU] via SUBCUTANEOUS
  Administered 2023-10-14: 1 [IU] via SUBCUTANEOUS
  Administered 2023-10-14 – 2023-10-15 (×2): 2 [IU] via SUBCUTANEOUS
  Administered 2023-10-15 (×2): 1 [IU] via SUBCUTANEOUS
  Administered 2023-10-15: 2 [IU] via SUBCUTANEOUS
  Administered 2023-10-15: 1 [IU] via SUBCUTANEOUS
  Administered 2023-10-16 (×2): 2 [IU] via SUBCUTANEOUS

## 2023-10-11 MED ORDER — KETOROLAC TROMETHAMINE 15 MG/ML IJ SOLN
15.0000 mg | Freq: Once | INTRAMUSCULAR | Status: AC
  Administered 2023-10-11: 15 mg via INTRAVENOUS
  Filled 2023-10-11: qty 1

## 2023-10-11 MED ORDER — LACTATED RINGERS IV SOLN: INTRAVENOUS | Status: AC

## 2023-10-11 MED ORDER — KETOROLAC TROMETHAMINE 30 MG/ML IJ SOLN
30.0000 mg | INTRAMUSCULAR | Status: AC | PRN
  Administered 2023-10-11: 30 mg via INTRAVENOUS
  Filled 2023-10-11: qty 1

## 2023-10-11 NOTE — Progress Notes (Signed)
 [facility] night cross cover note:   Per patient request, I have ordered Reglan  10 mg IV x 1 dose now has a component of management for her breakthrough headache.  There is already an existing order for prn Benadryl  as component of this management.   Camelia Cavalier, DO Hospitalist

## 2023-10-11 NOTE — Progress Notes (Addendum)
  Progress Note   Patient: Kendra Parker ZOX:[id number] DOB: 1970/09/15 DOA: 09/27/2023     12 DOS: the patient was seen and examined on 10/11/2023   Brief hospital course: 53 year old with HTN, HLD, chronic migraine headaches comes into the hospital with dull abdominal pain for the past 2 and half weeks, radiating into her back. This has been associated with nausea, vomiting. Given persistent symptoms presented to the ER, and a CT scan revealed acute pancreatitis with no necrosis or fluid collection. Right upper quadrant ultrasound showed fatty liver, and gallbladder was unremarkable. She was admitted to the hospital.   Assessment and Plan: Principal problem Acute pancreatitis -continuing supportive care.  CT scan on admission without complicating factors such as abscesses or necrosis.  There is no gallbladder pathology, no cholelithiasis or choledocholithiasis seen on imaging - She is following with neurology as an outpatient and is on Ubrelvy , Vyepti, sumatriptan . Migraine meds being held incase they may be related to pt's pancreatitis - Given lack of improvement in initial couple of days with IV fluids, n.p.o., failure to advance diet gastroenterology was consulted.  - NG tube replaced 5/19 after it was recently pulled out -Diet now being advanced slowly per GI to full liquid   Active problems Migraine headaches-on multiple medications at home, follows with neurology as an outpatient.  -GI initially recommended to hold off on resuming migraine meds given chance that migraine meds may be related to pancreatitis.  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  She would benefit from weight loss       Subjective: Continues having abd pains  Physical  Exam: Vitals:   10/11/23 0546 10/11/23 0558 10/11/23 0802 10/11/23 1711  BP: (!) 84/61 116/70 113/80 114/68  Pulse: 74 70 78 74  Resp: 18  18 19   Temp: (!) 97.5 F (36.4 C)  98.1 F (36.7 C) 98.5 F (36.9 C)  TempSrc: Oral     SpO2: 95%  95% 99%  Weight:      Height:       General exam: Awake, laying in bed, appears uncomfortable Respiratory system: Normal respiratory effort, no wheezing Cardiovascular system: regular rate, s1, s2 Gastrointestinal system: Soft, nondistended, positive BS Central nervous system: CN2-12 grossly intact, strength intact Extremities: Perfused, no clubbing Skin: Normal skin turgor, no notable skin lesions seen Psychiatry: Mood normal // no visual hallucinations   Data Reviewed:  Labs reviewed: Na 137, K 4.0, Cr 0.76, WBC 5.8, Hgb 10.6   Family Communication: Pt in room, family not at bedside  Disposition: Status is: Inpatient Remains inpatient appropriate because: severity of illness  Planned Discharge Destination: Home    Author: Cherylle Corwin, MD 10/11/2023 6:49 PM  For on call review [email or web address].

## 2023-10-11 NOTE — Progress Notes (Signed)
 Patient ID: Kendra Parker, female   DOB: [birth year], 53 y.o.   MRN: [id number]    Progress Note   Subjective   Day # 14 CC;acute pancreatitis- probably medication induced  Tube feedings at 50 cc/h  Labs today-WBC 5.8/hemoglobin 10.6/hematocrit 34.0 Potassium 4.0/BUN 10/creatinine 0.76 Albumin 2.9/LFTs within normal limits  Patient says her pain is about the same, she is trying to take some clear liquids but feels that she has increased abdominal pain after eating, would like to have boost but even though ordered she has not been receiving it Showed me an area under both breasts, very erythematous, nonpruritic Also asking about her migraine medicines, she had been on multiple different meds prior to admission most of which have been held, had a migraine last night treated with a migraine cocktail.  Has been off of her usual venlafaxine , Nurtec and Ubrelvy  Also states urine seems darker today    Objective   Vital signs in last 24 hours: Temp:  [97.5 F (36.4 C)-98.1 F (36.7 C)] 98.1 F (36.7 C) (05/20 0802) Pulse Rate:  [70-86] 78 (05/20 0802) Resp:  [18] 18 (05/20 0802) BP: (84-134)/(61-80) 113/80 (05/20 0802) SpO2:  [93 %-100 %] 95 % (05/20 0802) Last BM Date : 10/10/23 General:    Older white female in NAD Heart:  Regular rate and rhythm; no murmurs Lungs: Respirations even and unlabored, lungs CTA bilaterally Abdomen:  Soft, obese, tender across the upper abdomen some guarding no rebound. Normal bowel sounds.  Erythema under both breasts Extremities:  Without edema. Neurologic:  Alert and oriented,  grossly normal neurologically. Psych:  Cooperative. Normal mood and affect.  Intake/Output from previous day: 05/19 0701 - 05/20 0700 In: -  Out: 200 [Urine:200] Intake/Output this shift: Total I/O In: 200 [P.O.:200] Out: 400 [Urine:400]  Lab Results: Recent Labs    10/09/23 0756 10/10/23 0534 10/11/23 0513  WBC 5.2 6.4 5.8  HGB 10.7* 11.2* 10.6*  HCT 33.8* 35.2*  34.0*  PLT 438* 489* 458*   BMET Recent Labs    10/09/23 0756 10/10/23 0534 10/11/23 0513  NA 141 139 137  K 4.0 4.4 4.0  CL 106 103 104  CO2 26 25 25   GLUCOSE 123* 110* 136*  BUN 5* 5* 10  CREATININE 0.52 0.68 0.76  CALCIUM  9.4 9.9 9.4   LFT Recent Labs    10/11/23 0513  PROT 6.8  ALBUMIN 2.9*  AST 33  ALT 32  ALKPHOS 97  BILITOT 0.4   PT/INR No results for input(s): "LABPROT", "INR" in the last 72 hours.  Studies/Results: DG Abd 1 View Result Date: 10/10/2023 CLINICAL DATA:  Feeding tube placement. EXAM: ABDOMEN - 1 VIEW COMPARISON:  None Available. FINDINGS: Single intraoperative fluoroscopic spot image provided. The total fluoroscopic time is 1 minutes 54 seconds with a cumulative air Karma of 18.2 mGy. Feeding tube in the region of the duodenum. IMPRESSION: Feeding tube in the region of the duodenum. Electronically Signed   By: Angus Bark M.D.   On: 10/10/2023 09:42       Assessment / Plan:    #37 53 year old white female with acute moderate pancreatitis-etiology not definite but suspected to be medication induced, has been off multiple medicines since admission Very slow progression  Labs are reassuring  #2 nutrition-for track in place, receiving tube feedings at 50 cc/h, also trying to supplement with clear liquids, asking for boost  #3 chronic migraines-has been off of several of her regular medicines since admission, we need to resume  most of these if possible including venlafaxine  and Nurtec  #4 probable candidiasis/breasts  Plan-continue postpyloric tube feeds Will advance to full liquids so she can choose and also get boost 2-3 times daily-goal is to get her off of tube feedings Will restart fluids at 75/h as not taking much n.p.o. Defer to primary service regarding resuming her migraine regimen        Principal Problem:   Pancreatitis Active Problems:   Migraine   Insomnia   Dyspnea   Essential hypertension   Sinus tachycardia    Constipation   Chronic pain   Hypothyroidism   Dyslipidemia   Obesity, Class III, BMI 40-49.9 (morbid obesity)   Epigastric pain   Nausea without vomiting     LOS: 12 days   Tishara Pizano PA-C 10/11/2023, 12:20 PM

## 2023-10-11 NOTE — TOC Progression Note (Signed)
 Transition of Care [facility]) - Progression Note    Patient Details  Name: Kendra Parker MRN: [id number] Date of Birth: June 22, 1970  [facility] of [facility]) CM/SW Contact  Tom-Johnson, Judythe Postema Daphne, RN Phone Number: 10/11/2023, 4:32 PM  Clinical Narrative:     Patient continues with Tube feeding via Cortrak. Endorsing Abdominal pains with food. On Clear liquids and supplements. GI following.    Patient not Medically ready for discharge.  CM will continue to follow as patient progresses with care towards discharge.           Expected Discharge Plan and Services                                               Social Determinants of Health (SDOH) Interventions SDOH Screenings   Food Insecurity: No Food Insecurity (09/27/2023)  Housing: Low Risk  (09/27/2023)  Transportation Needs: No Transportation Needs (09/27/2023)  Utilities: Not At Risk (09/27/2023)  Tobacco Use: Medium Risk (09/27/2023)    Readmission Risk Interventions     No data to display

## 2023-10-11 NOTE — Plan of Care (Signed)
  Problem: Health Behavior/Discharge Planning: Goal: Ability to manage health-related needs will improve Outcome: Progressing   Problem: Clinical Measurements: Goal: Will remain free from infection Outcome: Progressing Goal: Respiratory complications will improve Outcome: Progressing Goal: Cardiovascular complication will be avoided Outcome: Progressing   Problem: Activity: Goal: Risk for activity intolerance will decrease Outcome: Progressing   Problem: Coping: Goal: Level of anxiety will decrease Outcome: Progressing   Problem: Elimination: Goal: Will not experience complications related to bowel motility Outcome: Progressing Goal: Will not experience complications related to urinary retention Outcome: Progressing   Problem: Safety: Goal: Ability to remain free from injury will improve Outcome: Progressing   Problem: Skin Integrity: Goal: Risk for impaired skin integrity will decrease Outcome: Progressing   Problem: Clinical Measurements: Goal: Ability to maintain clinical measurements within normal limits will improve Outcome: Not Progressing   Problem: Nutrition: Goal: Adequate nutrition will be maintained Outcome: Not Progressing   Problem: Pain Managment: Goal: General experience of comfort will improve and/or be controlled Outcome: Not Progressing   Still on clear liquids with limited fluid intake and frequent prn medication for pain.

## 2023-10-11 NOTE — Progress Notes (Signed)
 [facility] night cross cover note:   I was notified by the patient's RN this patient, history of chronic migraines, is reporting headache consistent with her history of migraines.  Per my discussions with the patient's RN, it is noted that the following cocktail has historically been effective in managing breakthrough migraines for this patient: Toradol , Reglan , Benadryl .  It appears that she is here with acute pancreatitis.  I subsequently placed orders for Toradol  15 mg IV x 1 dose now, Reglan  10 mg IV x 1 dose now, and Benadryl  12.5 mg IV x 1 dose now.     Camelia Cavalier, DO Hospitalist

## 2023-10-12 ENCOUNTER — Inpatient Hospital Stay (HOSPITAL_COMMUNITY)

## 2023-10-12 DIAGNOSIS — K859 Acute pancreatitis without necrosis or infection, unspecified: Secondary | ICD-10-CM | POA: Diagnosis not present

## 2023-10-12 LAB — GLUCOSE, CAPILLARY
Glucose-Capillary: 107 mg/dL — ABNORMAL HIGH (ref 70–99)
Glucose-Capillary: 117 mg/dL — ABNORMAL HIGH (ref 70–99)
Glucose-Capillary: 121 mg/dL — ABNORMAL HIGH (ref 70–99)
Glucose-Capillary: 128 mg/dL — ABNORMAL HIGH (ref 70–99)
Glucose-Capillary: 132 mg/dL — ABNORMAL HIGH (ref 70–99)
Glucose-Capillary: 88 mg/dL (ref 70–99)

## 2023-10-12 LAB — C-REACTIVE PROTEIN: CRP: 1.2 mg/dL — ABNORMAL HIGH (ref <1.0)

## 2023-10-12 LAB — LIPASE, BLOOD: Lipase: 43 U/L (ref 11–51)

## 2023-10-12 MED ORDER — SUMATRIPTAN SUCCINATE 100 MG PO TABS
100.0000 mg | ORAL_TABLET | Freq: Every day | ORAL | Status: DC
  Administered 2023-10-12 – 2023-10-19 (×8): 100 mg via ORAL
  Filled 2023-10-12 (×8): qty 1

## 2023-10-12 MED ORDER — RIMEGEPANT SULFATE 75 MG PO TBDP
75.0000 mg | ORAL_TABLET | ORAL | Status: DC
  Administered 2023-10-17 – 2023-10-19 (×2): 75 mg via ORAL
  Filled 2023-10-12 (×6): qty 1

## 2023-10-12 MED ORDER — RIZATRIPTAN BENZOATE 10 MG PO TBDP: 10.0000 mg | ORAL_TABLET | ORAL | Status: DC | PRN

## 2023-10-12 MED ORDER — UBROGEPANT 100 MG PO TABS: 100.0000 mg | ORAL_TABLET | ORAL | Status: DC | PRN

## 2023-10-12 MED ORDER — IOHEXOL 350 MG/ML SOLN
75.0000 mL | Freq: Once | INTRAVENOUS | Status: AC | PRN
  Administered 2023-10-12: 75 mL via INTRAVENOUS

## 2023-10-12 NOTE — Progress Notes (Signed)
 Patient ID: Kendra Parker, female   DOB: August 21, 1970, 53 y.o.   MRN: [id number]    Progress Note   Subjective   Day # 15 CC;acute pancreatitis-probably medication induced  Cortrak - TF at 50  No new labs today  Patient says she attempted some full liquids at breakfast and had fairly immediate increase in abdominal pain which she says is severe, no vomiting, tolerating tube feedings.  She was able to go all day yesterday with oral pain medications, did require IV analgesics this a.m. after breakfast.  Complaining of loose stools, on MiraLAX - has been up ambulating etc. Continuing to have problems with migraine but says the cocktail she is getting here is helpful he has been off of all of her usual regimen due to the pancreatitis   Objective   Vital signs in last 24 hours: Temp:  [97.4 F (36.3 C)-98.5 F (36.9 C)] 98.2 F (36.8 C) (05/21 0758) Pulse Rate:  [66-78] 75 (05/21 0758) Resp:  [17-19] 19 (05/21 0758) BP: (112-134)/(63-73) 134/72 (05/21 0758) SpO2:  [94 %-99 %] 96 % (05/21 0758) Last BM Date : 10/10/23 General:    white female in NAD, lying in bed Heart:  Regular rate and rhythm; no murmurs Lungs: Respirations even and unlabored, lungs CTA bilaterally Abdomen:  Soft, obese, tender across the upper abdomen no guarding or rebound no palpable mass or hepatosplenomegaly Extremities:  Without edema. Neurologic:  Alert and oriented,  grossly normal neurologically. Psych:  Cooperative. Normal mood and affect.  Intake/Output from previous day: 05/20 0701 - 05/21 0700 In: 2622.6 [P.O.:600; I.V.:321.8; NG/GT:1700.8] Out: 1350 [Urine:1350] Intake/Output this shift: Total I/O In: 200 [P.O.:200] Out: 0   Lab Results: Recent Labs    10/10/23 0534 10/11/23 0513  WBC 6.4 5.8  HGB 11.2* 10.6*  HCT 35.2* 34.0*  PLT 489* 458*   BMET Recent Labs    10/10/23 0534 10/11/23 0513  NA 139 137  K 4.4 4.0  CL 103 104  CO2 25 25  GLUCOSE 110* 136*  BUN 5* 10  CREATININE 0.68  0.76  CALCIUM  9.9 9.4   LFT Recent Labs    10/11/23 0513  PROT 6.8  ALBUMIN 2.9*  AST 33  ALT 32  ALKPHOS 97  BILITOT 0.4   PT/INR No results for input(s): "LABPROT", "INR" in the last 72 hours.    Assessment / Plan:    #58 53 year old female with acute pancreatitis, admitted 2 weeks ago. Etiology of pancreatitis is not entirely clear IG4 was normal No initial imaging findings showing sludge or gallstones, CT since has shown a small amount of gallbladder sludge MRCP no pancreatic duct stone or pancreatic ductal dilation, no pancreas to be some Pancreatitis is felt most likely medication induced, we had stopped Lipitor and fibrate as most likely offending agents.  Hospitalist service had discontinued her migraine medicine regimen-though feel these are less likely offenders and okay to restart from GI perspective i.e. venlafaxine , Nurtec, Ubrelvy   She is not making good progress, still requiring tube feedings and still complaining of immediate increase in abdominal pain with p.o. intake  Plan; repeat CT abdomen and pelvis today with contrast CRP and lipase OK to resume migraine regimen Continue attempts to wean off of IV narcotics and use oral narcotics. Continue tube feedings and clear to full liquid diet as she can tolerate  Further recommendations pending findings at CT    Principal Problem:   Pancreatitis Active Problems:   Migraine   Insomnia   Dyspnea  Essential hypertension   Sinus tachycardia   Constipation   Chronic pain   Hypothyroidism   Dyslipidemia   Obesity, Class III, BMI 40-49.9 (morbid obesity)   Epigastric pain   Nausea without vomiting     LOS: 13 days   Korben Carcione PA-C 10/12/2023, 10:53 AM

## 2023-10-12 NOTE — Progress Notes (Signed)
 [facility] ROUNDING NOTE Kendra Parker ZOX:[id number]  DOB: [birth year]  DOA: 09/27/2023  PCP: Joenathan Muslim, FNP  10/12/2023,12:09 PM  LOS: 13 days    Code Status: Full code   from: Home current Dispo: Likely home eventually   53 year old white female Known history of chronic migraines, HTN, dyslipidemia Developed severe abdominal pain 5/5 went to PCP and sent to ED CT abdomen/pelvis showed acute interstitial and edematous pancreatitis without evidence of necrosis with fluid collection-ultrasound showed hepatic fatty infiltration unremarkable gallbladder--heart rate 140s lipase 98 AST 250  Her chronic migraine meds were adjusted downwards Eventually a core track was placed for feeds on 5/12  Plan  Acute pancreatitis unclear etiology Unlikely to be migraine meds-hide resume the same Continues on tube feeds at 50 cc/h, boost and Banatrol Will discontinue laxatives Expect this will take time to resolve-continue saline 75 cc/H Defer further imaging to GI  Migraine headaches However resumed today per Maxalt  10 as needed migraine, Imitrex  100 daily, Ubrelvy  migraine cocktail if no resolution we will try and get her off IV opiates as best possible when we are able  HTN Continue metoprolol  50 twice daily Holding lisinopril 10 and Aldactone 25 for now  Depression Resume Seroquel  200 at bedtime, Effexor  75 daily, Cymbalta  90 at bedtime Twice daily  ?  Seizure continue Keppra  500 twice daily   DVT prophylaxis: Lovenox   Status is: Inpatient Remains inpatient appropriate because:   Requires no durable improvement prior to being able to discharge    Subjective: No distress looks comfortable no fever no chills but states headache is pretty bad No nausea no vomiting good stool not really wanting to eat  Objective + exam Vitals:   10/11/23 1711 10/11/23 2024 10/12/23 0522 10/12/23 0758  BP: 114/68 112/73 116/63 134/72  Pulse: 74 78 66 75  Resp: 19 18 17 19   Temp: 98.5 F (36.9 C) (!)  97.4 F (36.3 C) 97.8 F (36.6 C) 98.2 F (36.8 C)  TempSrc:      SpO2: 99% 97% 94% 96%  Weight:      Height:       Filed Weights   10/06/23 0543 10/07/23 0404 10/10/23 0500  Weight: 113.1 kg 109 kg 103.6 kg    Examination: BMI NCAT no focal deficit no icterus no pallor Mallampati 4 NG tube/core track in place clear no wheeze rales rhonchi S1-S2 no murmur ROM intact Power 5/5 No lower extremity edema and no focal neurological deficit  Data Reviewed: reviewed   CBC    Component Value Date/Time   WBC 5.8 10/11/2023 0513   RBC 3.52 (L) 10/11/2023 0513   HGB 10.6 (L) 10/11/2023 0513   HGB 13.0 09/27/2017 1549   HCT 34.0 (L) 10/11/2023 0513   HCT 39.9 09/27/2017 1549   PLT 458 (H) 10/11/2023 0513   MCV 96.6 10/11/2023 0513   MCV 91 09/27/2017 1549   MCH 30.1 10/11/2023 0513   MCHC 31.2 10/11/2023 0513   RDW 13.2 10/11/2023 0513   RDW 13.8 09/27/2017 1549   LYMPHSABS 2.6 11/28/2020 1728   LYMPHSABS 2.6 09/27/2017 1549   MONOABS 0.6 11/28/2020 1728   EOSABS 0.1 11/28/2020 1728   EOSABS 0.0 09/27/2017 1549   BASOSABS 0.1 11/28/2020 1728   BASOSABS 0.0 09/27/2017 1549      Latest Ref Rng & Units 10/11/2023    5:13 AM 10/10/2023    5:34 AM 10/09/2023    7:56 AM  CMP  Glucose 70 - 99 mg/dL 811  914  123   BUN 6 - 20 mg/dL 10  5  5    Creatinine 0.44 - 1.00 mg/dL 5.28  4.13  2.44   Sodium 135 - 145 mmol/L 137  139  141   Potassium 3.5 - 5.1 mmol/L 4.0  4.4  4.0   Chloride 98 - 111 mmol/L 104  103  106   CO2 22 - 32 mmol/L 25  25  26    Calcium  8.9 - 10.3 mg/dL 9.4  9.9  9.4   Total Protein 6.5 - 8.1 g/dL 6.8  7.2  6.5   Total Bilirubin 0.0 - 1.2 mg/dL 0.4  0.5  0.4   Alkaline Phos 38 - 126 U/L 97  95  81   AST 15 - 41 U/L 33  33  30   ALT 0 - 44 U/L 32  26  24     Scheduled Meds:  clonazePAM   1 mg Oral BID   DULoxetine   30 mg Oral QHS   DULoxetine   60 mg Oral QHS   enoxaparin  (LOVENOX ) injection  50 mg Subcutaneous Q24H   feeding supplement  1 Container Oral  TID BM   fiber supplement (BANATROL TF)  60 mL Per Tube BID   insulin aspart  0-9 Units Subcutaneous Q4H   levETIRAcetam   500 mg Oral BID   levothyroxine   25 mcg Oral Daily   metoprolol  tartrate  50 mg Oral BID   multivitamin with minerals  1 tablet Oral Daily   nystatin cream   Topical BID   pantoprazole   40 mg Oral QHS   polyethylene glycol  17 g Oral BID   pregabalin   25 mg Oral Daily   QUEtiapine   200 mg Oral QHS   Rimegepant Sulfate  75 mg Oral QODAY   senna-docusate  1 tablet Oral BID   sodium chloride  flush  3 mL Intravenous Q12H   SUMAtriptan   100 mg Oral Daily   venlafaxine  XR  75 mg Oral Q breakfast   Continuous Infusions:  feeding supplement (VITAL 1.5 CAL) 1,000 mL (10/12/23 0602)   lactated ringers  75 mL/hr at 10/12/23 0755    Time  44  Verlie Glisson, MD  [facility]

## 2023-10-13 DIAGNOSIS — G8929 Other chronic pain: Secondary | ICD-10-CM | POA: Diagnosis not present

## 2023-10-13 DIAGNOSIS — K859 Acute pancreatitis without necrosis or infection, unspecified: Secondary | ICD-10-CM | POA: Diagnosis not present

## 2023-10-13 LAB — GLUCOSE, CAPILLARY
Glucose-Capillary: 110 mg/dL — ABNORMAL HIGH (ref 70–99)
Glucose-Capillary: 133 mg/dL — ABNORMAL HIGH (ref 70–99)
Glucose-Capillary: 140 mg/dL — ABNORMAL HIGH (ref 70–99)
Glucose-Capillary: 148 mg/dL — ABNORMAL HIGH (ref 70–99)
Glucose-Capillary: 154 mg/dL — ABNORMAL HIGH (ref 70–99)
Glucose-Capillary: 177 mg/dL — ABNORMAL HIGH (ref 70–99)

## 2023-10-13 NOTE — Progress Notes (Signed)
 Nutrition Follow-up  DOCUMENTATION CODES:   Morbid obesity  INTERVENTION:  Continue TF via small bore feeding tube: Vital1.5 at 50 ml/h (1200 ml per day)   Provides 1880 kcal, 81 gm protein, 917 ml free water daily    Continue MVI w/ minerals Continue Banatrol BID-provides 45kcal, 5g soluble fiber and 2g protein per serving.  Monitor diet advancement and tolerance Continue Boost Breeze po TID, each supplement provides 250 kcal and 9 grams of protein  Collect new weight to assess trend   NUTRITION DIAGNOSIS:  Inadequate oral intake related to inability to eat, poor appetite, acute illness as evidenced by per patient/family report, estimated needs.- remains applicable  GOAL:  Patient will meet greater than or equal to 90% of their needs - meeting via TF regimen  MONITOR:  PO intake, TF tolerance, Supplement acceptance  REASON FOR ASSESSMENT:  Consult Assessment of nutrition requirement/status, Enteral/tube feeding initiation and management  ASSESSMENT:   Pt with PMH significant for: HTN, HLD, chronic migraine headaches presented to hospital w/ abdominal pain x2.5 weeks. Reported it radiating into her back accompanied by N/V. CT revealed acute pancreatitis. RUG ultrasound showed fatty liver.  5/6 admitted, clear liquid diet 5/8 NPO  5/9 MRI/MRCP: markedly increased moderate peripancreatic edema w/ areas of relatively ill-defined fluid signal seen 5/10 clear liquid diet 5/12 Cortrak placed - unable to get post-pyloric; trickle TF initiated 5/13 TF advanced 5/16 Cortrak advanced by IR to post pyloric  5/17 Cortrak dislodged 5/19 Cortrak replaced, TF re-initiated  5/20 advanced to full liquid diet  Advanced to full liquid diet 5/20. Abdominal pain with meal consumption continues. Tolerating tube feedings.   Average Meal Intake 5/18: 0-50% x2 documented meals 5/20: 50% x3 documented meals 5/21: 50-75% x3 documented meals   GI re-starting Boost Breeze as patient amicable  to trying them to get more nutrition in orally. Intake has improved. No new skin breakdown noted.    Admit Weight: 110kg Current Weight: 103.6kg   Needs new weight collection. Ordered. No edema noted. One bowel movement noted yesterday.   Drains/Lines: Small bore feeding tube (duodenum) placed 5/19 UOP: + 1 unmeasured occurrences  Miralax  modified to PRN as patient has been experiencing some loose stools, however not significant in frequency.   Meds: clonazepan, SSI 0-9 q4, levothyroxine , MVI, pantoprazole ,    Labs: no new labs to review  Diet Order:   Diet Order             Diet full liquid Room service appropriate? Yes; Fluid consistency: Thin  Diet effective now            EDUCATION NEEDS:  Education needs have been addressed  Skin:  Skin Assessment: Reviewed RN Assessment  Last BM:  5/21 - type 7 x1  Height:  Ht Readings from Last 1 Encounters:  09/27/23 5\' 3"  (1.6 m)   Weight:  Wt Readings from Last 1 Encounters:  10/10/23 103.6 kg   Ideal Body Weight:  52.3 kg  BMI:  Body mass index is 40.46 kg/m.  Estimated Nutritional Needs:   Kcal:  1700-1900 kcals  Protein:  75-90g  Fluid:  >1.7L/day  Con Decant MS, RD, LDN Registered Dietitian Clinical Nutrition RD Inpatient Contact Info in Amion

## 2023-10-13 NOTE — Plan of Care (Signed)
  Problem: Health Behavior/Discharge Planning: Goal: Ability to manage health-related needs will improve Outcome: Progressing   Problem: Clinical Measurements: Goal: Ability to maintain clinical measurements within normal limits will improve Outcome: Not Progressing   Problem: Clinical Measurements: Goal: Ability to maintain clinical measurements within normal limits will improve Outcome: Not Progressing Goal: Will remain free from infection Outcome: Progressing Goal: Diagnostic test results will improve Outcome: Progressing Goal: Respiratory complications will improve Outcome: Progressing Goal: Cardiovascular complication will be avoided Outcome: Progressing   Problem: Activity: Goal: Risk for activity intolerance will decrease Outcome: Progressing   Problem: Nutrition: Goal: Adequate nutrition will be maintained Outcome: Not Progressing   Problem: Coping: Goal: Level of anxiety will decrease Outcome: Progressing   Problem: Elimination: Goal: Will not experience complications related to bowel motility Outcome: Progressing Goal: Will not experience complications related to urinary retention Outcome: Progressing   Problem: Pain Managment: Goal: General experience of comfort will improve and/or be controlled Outcome: Progressing   Problem: Safety: Goal: Ability to remain free from injury will improve Outcome: Progressing   Problem: Skin Integrity: Goal: Risk for impaired skin integrity will decrease Outcome: Progressing  Slightly less pain medication. Still poor PO tolerance

## 2023-10-13 NOTE — Progress Notes (Signed)
 [facility] ROUNDING NOTE   Subjective: Clinically unchanged.  Continues to report severe pain anytime she tries to eat (Jello, applesauce), but does okay drinking water and juice.  Tolerating tube feeds at goal.  Having loose BM daily.  No vomiting. CT without any evidence of necrosis or organized fluid collection amenable to drainage. CRP nearly normalized.   Objective: Vital signs in last 24 hours: Temp:  [98.3 F (36.8 C)-98.8 F (37.1 C)] 98.7 F (37.1 C) (05/22 0614) Pulse Rate:  [77-86] 80 (05/22 0845) Resp:  [18-19] 18 (05/22 0845) BP: (114-139)/(65-84) 130/65 (05/22 0845) SpO2:  [97 %-98 %] 97 % (05/22 0845) Weight:  [104.6 kg] 104.6 kg (05/22 1115) Last BM Date : 10/13/23 General: NAD, obese Caucasian female, lying in bed Lungs:  CTA b/l, no w/r/r Heart:  RRR, no m/r/g Abdomen:  Soft, TTP in upper abdomen, no guarding/rigidity, ND, +BS Ext:  No c/c/e    Intake/Output from previous day: 05/21 0701 - 05/22 0700 In: 700 [P.O.:700] Out: 900 [Urine:900] Intake/Output this shift: Total I/O In: 2577.5 [P.O.:470; NG/GT:2107.5] Out: 1200 [Urine:1200]   Lab Results: Recent Labs    10/11/23 0513  WBC 5.8  HGB 10.6*  PLT 458*  MCV 96.6   BMET Recent Labs    10/11/23 0513  NA 137  K 4.0  CL 104  CO2 25  GLUCOSE 136*  BUN 10  CREATININE 0.76  CALCIUM  9.4   LFT Recent Labs    10/11/23 0513  PROT 6.8  ALBUMIN 2.9*  AST 33  ALT 32  ALKPHOS 97  BILITOT 0.4   PT/INR No results for input(s): "INR" in the last 72 hours.    Imaging/Other results: CT ABDOMEN PELVIS W CONTRAST Result Date: 10/12/2023 CLINICAL DATA:  Abdominal pain and history of pancreatitis EXAM: CT ABDOMEN AND PELVIS WITH CONTRAST TECHNIQUE: Multidetector CT imaging of the abdomen and pelvis was performed using the standard protocol following bolus administration of intravenous contrast. RADIATION DOSE REDUCTION: This exam was performed according to the departmental  dose-optimization program which includes automated exposure control, adjustment of the mA and/or kV according to patient size and/or use of iterative reconstruction technique. CONTRAST:  75mL OMNIPAQUE  IOHEXOL  350 MG/ML SOLN COMPARISON:  10/05/2023 FINDINGS: Lower chest: No acute abnormality. Hepatobiliary: Liver is within normal limits. Gallbladder is partially distended with dependent gallbladder sludge stable from the previous exam. Pancreas: Pancreas demonstrates a normal enhancement pattern although an increase in the degree of peripancreatic fluid is noted better delineated following contrast administration. Developing fluid collections are noted along the course of the pancreas anteriorly and in the gastrosplenic ligament region. The most defined collection measures up to 2.9 cm in transverse dimension and extends for approximately 8 cm in craniocaudad projection. This lies inferiorly extending along the anterior aspect of Gerota's fascia on the left. Spleen: Normal in size without focal abnormality. Adrenals/Urinary Tract: Adrenal glands are within normal limits. Kidneys demonstrate a normal enhancement pattern bilaterally. Normal excretion is noted on delayed images. No obstructive changes are seen. The bladder is within normal limits. Stomach/Bowel: No obstructive or inflammatory changes of the colon are seen. The appendix is within normal limits. Small bowel and stomach are unremarkable. Feeding catheter extends into the second portion of the duodenum. Vascular/Lymphatic: Aortic atherosclerosis. No enlarged abdominal or pelvic lymph nodes. Reproductive: Status post hysterectomy. No adnexal masses. Other: No abdominal wall hernia or abnormality. No abdominopelvic ascites. Musculoskeletal: No acute or significant osseous findings. IMPRESSION: Changes consistent with acute pancreatitis with increase in the degree  of peripancreatic fluid. Some developing collections are seen most prominently posterior to the  pancreas extending inferiorly along Gerota's fascia on the left. Gallbladder sludge without complicating factors. Electronically Signed   By: Kendra Parker M.D.   On: 10/12/2023 21:11      Assessment and Plan:   53 year old female with acute, uncomplicated pancreatitis, but with protracted recovery due to pain and PO intolerance.  Repeat CT 5/21 with increase in peripancreatic fluid collection compared to previous, but this is not unexpected.  Etiology of AP unclear, but gallstone pancreatitis possible based on presence of sludge in GB on CT x 2 (not noted on initial CT, US  or MRCP).  Medication induced AP also considered and lipitor and fibrate have been stopped (migraine medications were also stopped, but have since been restarted given clinical benefit and low likelihood of causing AP.  Her clinical improvement has been very slow, although objectively, things seem to be heading in the right direction.  Acute pancreatitis, uncomplicated - Continue tube feeds - Continue to try to wean off IV pain medication - Continue to advance diet as tolerated, low fat recommended - Consider Surgery consult either as inpatient or outpatient for cholecystectomy given presence of sludge (although this may have developed after her admission)     Kendra Hair, MD  10/13/2023, 3:51 PM [facility]

## 2023-10-13 NOTE — Progress Notes (Signed)
 [facility] ROUNDING NOTE Kendra Parker UJW:[id number]  DOB: 03-20-1971  DOA: 09/27/2023  PCP: Joenathan Muslim, FNP  10/13/2023,1:08 PM  LOS: 14 days    Code Status: Full code   from: Home current Dispo: Likely home eventually   53 year old white female Known history of chronic migraines, HTN, dyslipidemia Developed severe abdominal pain 5/5 went to PCP and sent to ED CT abdomen/pelvis showed acute interstitial and edematous pancreatitis without evidence of necrosis with fluid collection-ultrasound showed hepatic fatty infiltration unremarkable gallbladder--heart rate 140s lipase 98 AST 250  Her chronic migraine meds were adjusted downwards Eventually a core track was placed for feeds on 5/12  Plan  Acute pancreatitis unclear etiology Causes and likely secondary to meds for migraines Continues on tube feeds at 50 cc/h, boost and Banatrol --She is still having loose stool CT abdomen/pelvis 5/21 shows expected changes Continues to be on feeds but is eating close 75% with some discomfort Have discussed we will switch Dilaudid  to every 4-6 hours and continue oxycodone   Migraine headaches Back on Maxalt  10 as needed migraine, Imitrex  100 daily, Ubrelvy   Headache cerebral baseline  HTN Continue metoprolol  50 twice daily Holding lisinopril 10 and Aldactone 25 for now  Depression Resume Seroquel  200 at bedtime, Effexor  75 daily, Cymbalta  90 at bedtime Twice daily  ?  Seizure continue Keppra  500 twice daily   DVT prophylaxis: Lovenox   Status is: Inpatient Remains inpatient appropriate because:   Requires no durable improvement prior to being able to discharge    Subjective:  Overall pretty unchanged Getting out of bed some but not walking in the hallway Still having loose BM No fever  Objective + exam Vitals:   10/12/23 2046 10/13/23 0614 10/13/23 0845 10/13/23 1115  BP: 128/73 114/68 130/65   Pulse: 86 77 80   Resp: 18 18 18    Temp: 98.8 F (37.1 C) 98.7 F (37.1 C)     TempSrc:      SpO2: 97% 97% 97%   Weight:    104.6 kg  Height:       Filed Weights   10/07/23 0404 10/10/23 0500 10/13/23 1115  Weight: 109 kg 103.6 kg 104.6 kg    Examination:  Awake obese white female core track in place Chest is clear S1-S2 no murmur ROM intact Abdominal soft no rebound no guard-she has no epigastric tenderness Neuro grossly intact  Data Reviewed: reviewed   CBC    Component Value Date/Time   WBC 5.8 10/11/2023 0513   RBC 3.52 (L) 10/11/2023 0513   HGB 10.6 (L) 10/11/2023 0513   HGB 13.0 09/27/2017 1549   HCT 34.0 (L) 10/11/2023 0513   HCT 39.9 09/27/2017 1549   PLT 458 (H) 10/11/2023 0513   MCV 96.6 10/11/2023 0513   MCV 91 09/27/2017 1549   MCH 30.1 10/11/2023 0513   MCHC 31.2 10/11/2023 0513   RDW 13.2 10/11/2023 0513   RDW 13.8 09/27/2017 1549   LYMPHSABS 2.6 11/28/2020 1728   LYMPHSABS 2.6 09/27/2017 1549   MONOABS 0.6 11/28/2020 1728   EOSABS 0.1 11/28/2020 1728   EOSABS 0.0 09/27/2017 1549   BASOSABS 0.1 11/28/2020 1728   BASOSABS 0.0 09/27/2017 1549      Latest Ref Rng & Units 10/11/2023    5:13 AM 10/10/2023    5:34 AM 10/09/2023    7:56 AM  CMP  Glucose 70 - 99 mg/dL 562  130  865   BUN 6 - 20 mg/dL 10  5  5    Creatinine  0.44 - 1.00 mg/dL 9.62  9.52  8.41   Sodium 135 - 145 mmol/L 137  139  141   Potassium 3.5 - 5.1 mmol/L 4.0  4.4  4.0   Chloride 98 - 111 mmol/L 104  103  106   CO2 22 - 32 mmol/L 25  25  26    Calcium  8.9 - 10.3 mg/dL 9.4  9.9  9.4   Total Protein 6.5 - 8.1 g/dL 6.8  7.2  6.5   Total Bilirubin 0.0 - 1.2 mg/dL 0.4  0.5  0.4   Alkaline Phos 38 - 126 U/L 97  95  81   AST 15 - 41 U/L 33  33  30   ALT 0 - 44 U/L 32  26  24     Scheduled Meds:  clonazePAM   1 mg Oral BID   DULoxetine   30 mg Oral QHS   DULoxetine   60 mg Oral QHS   enoxaparin  (LOVENOX ) injection  50 mg Subcutaneous Q24H   feeding supplement  1 Container Oral TID BM   fiber supplement (BANATROL TF)  60 mL Per Tube BID   insulin aspart  0-9  Units Subcutaneous Q4H   levETIRAcetam   500 mg Oral BID   levothyroxine   25 mcg Oral Daily   metoprolol  tartrate  50 mg Oral BID   multivitamin with minerals  1 tablet Oral Daily   nystatin cream   Topical BID   pantoprazole   40 mg Oral QHS   pregabalin   25 mg Oral Daily   QUEtiapine   200 mg Oral QHS   Rimegepant Sulfate  75 mg Oral QODAY   sodium chloride  flush  3 mL Intravenous Q12H   SUMAtriptan   100 mg Oral Daily   venlafaxine  XR  75 mg Oral Q breakfast   Continuous Infusions:  feeding supplement (VITAL 1.5 CAL) 50 mL/hr at 10/13/23 1119    Time  44  Jai-Gurmukh Jadyn Brasher, MD  [facility]

## 2023-10-14 DIAGNOSIS — K859 Acute pancreatitis without necrosis or infection, unspecified: Secondary | ICD-10-CM | POA: Diagnosis not present

## 2023-10-14 LAB — GLUCOSE, CAPILLARY
Glucose-Capillary: 120 mg/dL — ABNORMAL HIGH (ref 70–99)
Glucose-Capillary: 132 mg/dL — ABNORMAL HIGH (ref 70–99)
Glucose-Capillary: 146 mg/dL — ABNORMAL HIGH (ref 70–99)
Glucose-Capillary: 158 mg/dL — ABNORMAL HIGH (ref 70–99)
Glucose-Capillary: 162 mg/dL — ABNORMAL HIGH (ref 70–99)
Glucose-Capillary: 175 mg/dL — ABNORMAL HIGH (ref 70–99)
Glucose-Capillary: 181 mg/dL — ABNORMAL HIGH (ref 70–99)

## 2023-10-14 LAB — COMPREHENSIVE METABOLIC PANEL WITH GFR
ALT: 23 U/L (ref 0–44)
AST: 24 U/L (ref 15–41)
Albumin: 3.3 g/dL — ABNORMAL LOW (ref 3.5–5.0)
Alkaline Phosphatase: 96 U/L (ref 38–126)
Anion gap: 10 (ref 5–15)
BUN: 7 mg/dL (ref 6–20)
CO2: 23 mmol/L (ref 22–32)
Calcium: 9.4 mg/dL (ref 8.9–10.3)
Chloride: 106 mmol/L (ref 98–111)
Creatinine, Ser: 0.57 mg/dL (ref 0.44–1.00)
GFR, Estimated: >60 mL/min (ref >60)
Glucose, Bld: 131 mg/dL — ABNORMAL HIGH (ref 70–99)
Potassium: 3.8 mmol/L (ref 3.5–5.1)
Sodium: 139 mmol/L (ref 135–145)
Total Bilirubin: <0.2 mg/dL (ref 0.0–1.2)
Total Protein: 7.3 g/dL (ref 6.5–8.1)

## 2023-10-14 MED ORDER — HYDROMORPHONE HCL 1 MG/ML IJ SOLN
1.0000 mg | INTRAMUSCULAR | Status: DC | PRN
  Administered 2023-10-14 – 2023-10-17 (×6): 1 mg via INTRAVENOUS
  Filled 2023-10-14 (×6): qty 1

## 2023-10-14 MED ORDER — UBROGEPANT 100 MG PO TABS
100.0000 mg | ORAL_TABLET | Freq: Every day | ORAL | Status: DC | PRN
  Administered 2023-10-18 – 2023-10-19 (×2): 100 mg via ORAL
  Filled 2023-10-14 (×3): qty 1

## 2023-10-14 MED ORDER — ENOXAPARIN SODIUM 60 MG/0.6ML IJ SOSY
55.0000 mg | PREFILLED_SYRINGE | INTRAMUSCULAR | Status: DC
  Administered 2023-10-15 – 2023-10-22 (×8): 55 mg via SUBCUTANEOUS
  Filled 2023-10-14 (×8): qty 0.6

## 2023-10-14 MED ORDER — UBROGEPANT 100 MG PO TABS: 100.0000 mg | ORAL_TABLET | Freq: Every day | ORAL | Status: DC | PRN

## 2023-10-14 NOTE — Progress Notes (Signed)
 [facility] ROUNDING NOTE Kendra Parker [id number]:[id number]  DOB: 28-Nov-1970  DOA: 09/27/2023  PCP: Joenathan Muslim, FNP  10/14/2023,3:33 PM  LOS: 15 days    Code Status: Full code   from: Home current Dispo: Likely home eventually   53 year old white female Known history of chronic migraines, HTN, dyslipidemia Developed severe abdominal pain 5/5 went to PCP and sent to ED CT abdomen/pelvis showed acute interstitial and edematous pancreatitis without evidence of necrosis with fluid collection-ultrasound showed hepatic fatty infiltration unremarkable gallbladder--heart rate 140s lipase 98 AST 250  Her chronic migraine meds were adjusted downwards Eventually a core track was placed for feeds on 5/12  Plan  Acute pancreatitis unclear etiology Causes and likely secondary to meds for migraines Continues on tube feeds at 50 cc/h, boost and Banatrol --She is still having loose stool but is not really eating a regular diet CT abdomen/pelvis 5/21 shows expected changes Has not eaten much today--- switching Dilaudid  to 1 mg every 4 with a goal to come off the dilaudid  in 2-3 days Have asked that she try to graduate diet  Migraine headaches--Home regimen is complex and includes Vyepti 300 every 3 months, Keppra  500 twice daily, Nurtec 75 every other day, Maxalt  10 by mouth as needed (no more than 4 doses of any triptan per week), Imitrex  maximum 3 doses per week--Ubrelvy  maximum 2 doses per week Back on Maxalt  10 as needed migraine, Imitrex  100 daily, Ubrelvy  100 daily as needed breakthrough migraine-also on Keppra  500 twice daily, methocarbamol  750 every 8 as needed, Lyrica  25 daily Headache seems to be at baseline She seems relatively stable so we will continue her on what she is on currently  HTN Continue metoprolol  50 twice daily Holding lisinopril 10 and Aldactone 25 for now  Depression Resume Seroquel  200 at bedtime, Effexor  75 daily, Cymbalta  90 at bedtime  ?  Seizure continue Keppra  500 twice  daily   DVT prophylaxis: Lovenox   Status is: Inpatient Remains inpatient appropriate because:   Requires no durable improvement prior to being able to discharge    Subjective:  Unchanged overall moderate to severe pain earlier today no chest pain currently was in tears and eating yesterday Has not been out of bed-Food remains untouched at the bedside  Objective + exam Vitals:   10/13/23 1622 10/13/23 2027 10/14/23 0440 10/14/23 0833  BP: 128/63 121/65 101/67 110/71  Pulse: 76 82 75 84  Resp: 18 18 18 18   Temp:  98.3 F (36.8 C) 98.5 F (36.9 C) 98.6 F (37 C)  TempSrc:      SpO2: 98% 98% 97% 95%  Weight:   112 kg   Height:       Filed Weights   10/10/23 0500 10/13/23 1115 10/14/23 0440  Weight: 103.6 kg 104.6 kg 112 kg    Examination:  Awake obese white female core track in place Chest is clear S1 S2 no murmur no rub no gallop Abdo soft obese nontender no rebound No Lower extremity edema  Data Reviewed: reviewed   CBC    Component Value Date/Time   WBC 5.8 10/11/2023 0513   RBC 3.52 (L) 10/11/2023 0513   HGB 10.6 (L) 10/11/2023 0513   HGB 13.0 09/27/2017 1549   HCT 34.0 (L) 10/11/2023 0513   HCT 39.9 09/27/2017 1549   PLT 458 (H) 10/11/2023 0513   MCV 96.6 10/11/2023 0513   MCV 91 09/27/2017 1549   MCH 30.1 10/11/2023 0513   MCHC 31.2 10/11/2023 0513   RDW 13.2  10/11/2023 0513   RDW 13.8 09/27/2017 1549   LYMPHSABS 2.6 11/28/2020 1728   LYMPHSABS 2.6 09/27/2017 1549   MONOABS 0.6 11/28/2020 1728   EOSABS 0.1 11/28/2020 1728   EOSABS 0.0 09/27/2017 1549   BASOSABS 0.1 11/28/2020 1728   BASOSABS 0.0 09/27/2017 1549      Latest Ref Rng & Units 10/14/2023    9:48 AM 10/11/2023    5:13 AM 10/10/2023    5:34 AM  CMP  Glucose 70 - 99 mg/dL 045  409  811   BUN 6 - 20 mg/dL 7  10  5    Creatinine 0.44 - 1.00 mg/dL 9.14  7.82  9.56   Sodium 135 - 145 mmol/L 139  137  139   Potassium 3.5 - 5.1 mmol/L 3.8  4.0  4.4   Chloride 98 - 111 mmol/L 106  104   103   CO2 22 - 32 mmol/L 23  25  25    Calcium  8.9 - 10.3 mg/dL 9.4  9.4  9.9   Total Protein 6.5 - 8.1 g/dL 7.3  6.8  7.2   Total Bilirubin 0.0 - 1.2 mg/dL <2.1  0.4  0.5   Alkaline Phos 38 - 126 U/L 96  97  95   AST 15 - 41 U/L 24  33  33   ALT 0 - 44 U/L 23  32  26     Scheduled Meds:  clonazePAM   1 mg Oral BID   DULoxetine   30 mg Oral QHS   DULoxetine   60 mg Oral QHS   [START ON 10/15/2023] enoxaparin  (LOVENOX ) injection  55 mg Subcutaneous Q24H   feeding supplement  1 Container Oral TID BM   fiber supplement (BANATROL TF)  60 mL Per Tube BID   insulin aspart  0-9 Units Subcutaneous Q4H   levETIRAcetam   500 mg Oral BID   levothyroxine   25 mcg Oral Daily   metoprolol  tartrate  50 mg Oral BID   multivitamin with minerals  1 tablet Oral Daily   nystatin cream   Topical BID   pantoprazole   40 mg Oral QHS   pregabalin   25 mg Oral Daily   QUEtiapine   200 mg Oral QHS   Rimegepant Sulfate  75 mg Oral QODAY   sodium chloride  flush  3 mL Intravenous Q12H   SUMAtriptan   100 mg Oral Daily   venlafaxine  XR  75 mg Oral Q breakfast   Continuous Infusions:  feeding supplement (VITAL 1.5 CAL) 1,000 mL (10/14/23 0419)    Time  34  Kendra Glisson, MD  [facility]

## 2023-10-14 NOTE — TOC Progression Note (Signed)
 Transition of Care [facility]) - Progression Note    Patient Details  Name: Kendra Parker MRN: [id number] Date of Birth: 1970-08-21  [facility] CM/SW Contact  Tom-Johnson, Angelique Ken, RN Phone Number: 10/14/2023, 2:40 PM  Clinical Narrative:     Patient continues to c/o severe pain anytime she tries to eat orally. Cortrak in place and tolerating feeds at goal. Plan to continue tube feeds at goal, wean off IV pain medication and advance diet as tolerated.   No TOC needs or recommendations noted at this time.  Patient not Medically ready for discharge.  CM will continue to follow as patient progresses with care towards discharge.              Expected Discharge Plan and Services                                                Social Determinants of Health (SDOH) Interventions SDOH Screenings   Food Insecurity: No Food Insecurity (09/27/2023)  Housing: Low Risk  (09/27/2023)  Transportation Needs: No Transportation Needs (09/27/2023)  Utilities: Not At Risk (09/27/2023)  Tobacco Use: Medium Risk (09/27/2023)    Readmission Risk Interventions     No data to display

## 2023-10-15 ENCOUNTER — Inpatient Hospital Stay (HOSPITAL_COMMUNITY)

## 2023-10-15 DIAGNOSIS — K859 Acute pancreatitis without necrosis or infection, unspecified: Secondary | ICD-10-CM | POA: Diagnosis not present

## 2023-10-15 LAB — GLUCOSE, CAPILLARY
Glucose-Capillary: 133 mg/dL — ABNORMAL HIGH (ref 70–99)
Glucose-Capillary: 135 mg/dL — ABNORMAL HIGH (ref 70–99)
Glucose-Capillary: 150 mg/dL — ABNORMAL HIGH (ref 70–99)
Glucose-Capillary: 157 mg/dL — ABNORMAL HIGH (ref 70–99)
Glucose-Capillary: 157 mg/dL — ABNORMAL HIGH (ref 70–99)
Glucose-Capillary: 158 mg/dL — ABNORMAL HIGH (ref 70–99)

## 2023-10-15 MED ORDER — PROCHLORPERAZINE EDISYLATE 10 MG/2ML IJ SOLN
10.0000 mg | Freq: Four times a day (QID) | INTRAMUSCULAR | Status: DC | PRN
  Administered 2023-10-18: 10 mg via INTRAVENOUS
  Filled 2023-10-15: qty 2

## 2023-10-15 MED ORDER — ONDANSETRON HCL 4 MG/2ML IJ SOLN
4.0000 mg | Freq: Four times a day (QID) | INTRAMUSCULAR | Status: DC | PRN
  Administered 2023-10-15 (×2): 4 mg via INTRAVENOUS
  Filled 2023-10-15 (×2): qty 2

## 2023-10-15 NOTE — Progress Notes (Signed)
 [facility] ROUNDING NOTE WALAA CAREL [id number]:[id number]  DOB: February 23, 1971  DOA: 09/27/2023  PCP: Joenathan Muslim, FNP  10/15/2023,3:44 PM  LOS: 16 days    Code Status: Full code   from: Home current Dispo: Likely home eventually   53 year old white female Known history of chronic migraines, HTN, dyslipidemia Developed severe abdominal pain 5/5 went to PCP and sent to ED CT abdomen/pelvis showed acute interstitial and edematous pancreatitis without evidence of necrosis with fluid collection-ultrasound showed hepatic fatty infiltration unremarkable gallbladder--heart rate 140s lipase 98 AST 250  Her chronic migraine meds were adjusted downwards Eventually a core track was placed for feeds on 5/12  Plan  Acute pancreatitis unclear etiology Causes and likely secondary to meds for migraines Continues on tube feeds at 50 cc/h, boost and Banatrol N.p.o. right now  ?  Ileus Passed 1 stool yesterday soft but had 2 episodes of vomiting only passing gas Get stat CXR DG ABD rule out ileus/bowel obstruction May need CT scan if still does not improve Have encouraged her to get out of bed and walk around and mobilize as she is nonambulatory   Migraine headaches--Home regimen is complex and includes Vyepti 300 every 3 months, Keppra  500 twice daily, Nurtec 75 every other day, Maxalt  10 by mouth as needed (no more than 4 doses of any triptan per week), Imitrex  maximum 3 doses per week--Ubrelvy  maximum 2 doses per week Back on Maxalt  10 as needed migraine, Imitrex  100 daily, Ubrelvy  100 daily as needed breakthrough migraine-also on Keppra  500 twice daily, methocarbamol  750 every 8 as needed, Lyrica  25 daily Headache seems to be at baseline continue her on what she is on currently  HTN Continue metoprolol  50 twice daily Holding lisinopril 10 and Aldactone 25 for now  Depression Resume Seroquel  200 at bedtime, Effexor  75 daily, Cymbalta  90 at bedtime  ?  Seizure continue Keppra  500 twice daily   DVT  prophylaxis: Lovenox   Status is: Inpatient Remains inpatient appropriate because:   Requires no durable improvement prior to being able to discharge    Subjective:  Passing only gas 1 stool yesterday 2 episodes of vomiting nonbleeding abdominal pain remains about the same no fever no chills   Objective + exam Vitals:   10/14/23 1955 10/15/23 0422 10/15/23 0500 10/15/23 0921  BP: 131/81 128/74  122/84  Pulse: 74 81  89  Resp: 18 18  18   Temp: (!) 97.4 F (36.3 C) (!) 97.5 F (36.4 C)  98 F (36.7 C)  TempSrc: Oral Oral    SpO2: 96% 94%  93%  Weight:   111.7 kg   Height:       Filed Weights   10/13/23 1115 10/14/23 0440 10/15/23 0500  Weight: 104.6 kg 112 kg 111.7 kg    Examination:  Awake obese white female core track in place Chest is clear S1-S2 no murmur Abdominal soft slightly tender in epigastrium no lower extremity edema ROM intact   Data Reviewed: reviewed   CBC    Component Value Date/Time   WBC 5.8 10/11/2023 0513   RBC 3.52 (L) 10/11/2023 0513   HGB 10.6 (L) 10/11/2023 0513   HGB 13.0 09/27/2017 1549   HCT 34.0 (L) 10/11/2023 0513   HCT 39.9 09/27/2017 1549   PLT 458 (H) 10/11/2023 0513   MCV 96.6 10/11/2023 0513   MCV 91 09/27/2017 1549   MCH 30.1 10/11/2023 0513   MCHC 31.2 10/11/2023 0513   RDW 13.2 10/11/2023 0513   RDW 13.8 09/27/2017 1549  LYMPHSABS 2.6 11/28/2020 1728   LYMPHSABS 2.6 09/27/2017 1549   MONOABS 0.6 11/28/2020 1728   EOSABS 0.1 11/28/2020 1728   EOSABS 0.0 09/27/2017 1549   BASOSABS 0.1 11/28/2020 1728   BASOSABS 0.0 09/27/2017 1549      Latest Ref Rng & Units 10/14/2023    9:48 AM 10/11/2023    5:13 AM 10/10/2023    5:34 AM  CMP  Glucose 70 - 99 mg/dL 295  621  308   BUN 6 - 20 mg/dL 7  10  5    Creatinine 0.44 - 1.00 mg/dL 6.57  8.46  9.62   Sodium 135 - 145 mmol/L 139  137  139   Potassium 3.5 - 5.1 mmol/L 3.8  4.0  4.4   Chloride 98 - 111 mmol/L 106  104  103   CO2 22 - 32 mmol/L 23  25  25    Calcium  8.9 -  10.3 mg/dL 9.4  9.4  9.9   Total Protein 6.5 - 8.1 g/dL 7.3  6.8  7.2   Total Bilirubin 0.0 - 1.2 mg/dL <9.5  0.4  0.5   Alkaline Phos 38 - 126 U/L 96  97  95   AST 15 - 41 U/L 24  33  33   ALT 0 - 44 U/L 23  32  26     Scheduled Meds:  clonazePAM   1 mg Oral BID   DULoxetine   30 mg Oral QHS   DULoxetine   60 mg Oral QHS   enoxaparin  (LOVENOX ) injection  55 mg Subcutaneous Q24H   feeding supplement  1 Container Oral TID BM   fiber supplement (BANATROL TF)  60 mL Per Tube BID   insulin aspart  0-9 Units Subcutaneous Q4H   levETIRAcetam   500 mg Oral BID   levothyroxine   25 mcg Oral Daily   metoprolol  tartrate  50 mg Oral BID   multivitamin with minerals  1 tablet Oral Daily   nystatin cream   Topical BID   pantoprazole   40 mg Oral QHS   pregabalin   25 mg Oral Daily   QUEtiapine   200 mg Oral QHS   Rimegepant Sulfate  75 mg Oral QODAY   sodium chloride  flush  3 mL Intravenous Q12H   SUMAtriptan   100 mg Oral Daily   venlafaxine  XR  75 mg Oral Q breakfast   Continuous Infusions:  feeding supplement (VITAL 1.5 CAL) 50 mL/hr at 10/15/23 1208    Time  36  Verlie Glisson, MD  [facility]

## 2023-10-16 ENCOUNTER — Inpatient Hospital Stay (HOSPITAL_COMMUNITY)

## 2023-10-16 DIAGNOSIS — K859 Acute pancreatitis without necrosis or infection, unspecified: Secondary | ICD-10-CM | POA: Diagnosis not present

## 2023-10-16 DIAGNOSIS — G8929 Other chronic pain: Secondary | ICD-10-CM | POA: Diagnosis not present

## 2023-10-16 LAB — CBC WITH DIFFERENTIAL/PLATELET
Abs Immature Granulocytes: 0.02 10*3/uL (ref 0.00–0.07)
Basophils Absolute: 0.1 10*3/uL (ref 0.0–0.1)
Basophils Relative: 1 %
Eosinophils Absolute: 0.9 10*3/uL — ABNORMAL HIGH (ref 0.0–0.5)
Eosinophils Relative: 12 %
HCT: 33.4 % — ABNORMAL LOW (ref 36.0–46.0)
Hemoglobin: 10.7 g/dL — ABNORMAL LOW (ref 12.0–15.0)
Immature Granulocytes: 0 %
Lymphocytes Relative: 28 %
Lymphs Abs: 2.2 10*3/uL (ref 0.7–4.0)
MCH: 30.7 pg (ref 26.0–34.0)
MCHC: 32 g/dL (ref 30.0–36.0)
MCV: 95.7 fL (ref 80.0–100.0)
Monocytes Absolute: 0.6 10*3/uL (ref 0.1–1.0)
Monocytes Relative: 8 %
Neutro Abs: 4 10*3/uL (ref 1.7–7.7)
Neutrophils Relative %: 51 %
Platelets: 423 10*3/uL — ABNORMAL HIGH (ref 150–400)
RBC: 3.49 MIL/uL — ABNORMAL LOW (ref 3.87–5.11)
RDW: 13.1 % (ref 11.5–15.5)
WBC: 7.8 10*3/uL (ref 4.0–10.5)
nRBC: 0 % (ref 0.0–0.2)

## 2023-10-16 LAB — LIPASE, BLOOD: Lipase: 34 U/L (ref 11–51)

## 2023-10-16 LAB — COMPREHENSIVE METABOLIC PANEL WITH GFR
ALT: 21 U/L (ref 0–44)
AST: 21 U/L (ref 15–41)
Albumin: 3 g/dL — ABNORMAL LOW (ref 3.5–5.0)
Alkaline Phosphatase: 83 U/L (ref 38–126)
Anion gap: 9 (ref 5–15)
BUN: 10 mg/dL (ref 6–20)
CO2: 27 mmol/L (ref 22–32)
Calcium: 9.2 mg/dL (ref 8.9–10.3)
Chloride: 103 mmol/L (ref 98–111)
Creatinine, Ser: 0.58 mg/dL (ref 0.44–1.00)
GFR, Estimated: >60 mL/min (ref >60)
Glucose, Bld: 150 mg/dL — ABNORMAL HIGH (ref 70–99)
Potassium: 3.7 mmol/L (ref 3.5–5.1)
Sodium: 139 mmol/L (ref 135–145)
Total Bilirubin: 0.4 mg/dL (ref 0.0–1.2)
Total Protein: 6.5 g/dL (ref 6.5–8.1)

## 2023-10-16 LAB — GLUCOSE, CAPILLARY
Glucose-Capillary: 160 mg/dL — ABNORMAL HIGH (ref 70–99)
Glucose-Capillary: 132 mg/dL — ABNORMAL HIGH (ref 70–99)
Glucose-Capillary: 160 mg/dL — ABNORMAL HIGH (ref 70–99)

## 2023-10-16 MED ORDER — IOHEXOL 350 MG/ML SOLN
75.0000 mL | Freq: Once | INTRAVENOUS | Status: AC | PRN
  Administered 2023-10-16: 75 mL via INTRAVENOUS

## 2023-10-16 NOTE — Progress Notes (Signed)
 [facility] ROUNDING NOTE Kendra Parker [id number]  DOB: 1971-04-03  DOA: 09/27/2023  PCP: Joenathan Muslim, FNP  10/16/2023,12:53 PM  LOS: 17 days    Code Status: Full code   from: Home current Dispo: Likely home eventually   53 year old white female Known history of chronic migraines, HTN, dyslipidemia Developed severe abdominal pain 5/5 went to PCP and sent to ED CT abdomen/pelvis showed acute interstitial and edematous pancreatitis without evidence of necrosis with fluid collection-ultrasound showed hepatic fatty infiltration unremarkable gallbladder--heart rate 140s lipase 98 AST 250  Her chronic migraine meds were adjusted downwards Eventually a core track was placed for feeds on 5/12  Plan  Acute pancreatitis unclear etiology Causes and likely secondary to meds for migraines Continues on tube feeds at 50 cc/h, boost and Banatrol Repeat CT performed 5/24 no changes-no ileus therefore transition back to full liquid diet Not having any diarrhea now Have asked GI to come back and reassess suspect that this will take time for the patient to resolve  Migraine headaches--Home regimen is complex and includes Vyepti 300 every 3 months, Keppra  500 twice daily, Nurtec 75 every other day, Maxalt  10 by mouth as needed (no more than 4 doses of any triptan per week), Imitrex  maximum 3 doses per week--Ubrelvy  maximum 2 doses per week Back on Maxalt  10 as needed migraine, Imitrex  100 daily, Ubrelvy  100 daily as needed breakthrough migraine-also on Keppra  500 twice daily, methocarbamol  750 every 8 as needed, Lyrica  25 daily Headache seems to be at baseline continue her on what she is on currently  HTN Continue metoprolol  50 twice daily Holding lisinopril 10 and Aldactone 25 f  Impaired glucose tolerance CBGs ranging between 130 and 160-as not above 180 consistently and not on PTA meds will discontinue coverage  Depression Resume Seroquel  200 at bedtime, Effexor  75 daily, Cymbalta  90 at  bedtime  ?  Seizure continue Keppra  500 twice daily   DVT prophylaxis: Lovenox   Status is: Inpatient Remains inpatient appropriate because:   Requires no durable improvement prior to being able to discharge    Subjective:  Still with abdominal pain rates it as 10/10 no nausea no vomiting no diarrhea abdominal feels about the same  Objective + exam Vitals:   10/15/23 1658 10/15/23 1931 10/16/23 0536 10/16/23 0916  BP: 135/84 115/79 103/60 98/68  Pulse: 84 86 80 93  Resp: 18 18 16 18   Temp:  97.6 F (36.4 C) 98.5 F (36.9 C) 98.3 F (36.8 C)  TempSrc:  Oral    SpO2: 96% 97% 96% 96%  Weight:      Height:       Filed Weights   10/13/23 1115 10/14/23 0440 10/15/23 0500  Weight: 104.6 kg 112 kg 111.7 kg    Examination:  Awake obese white female core track in place Chest is clear S1-S2 no murmur Abdominal exam about the same soft nontender no rebound slight epigastric tenderness  Pain subjectively out of proportion to objective findings no lower extremity edema ROM intact Neuro intact moving 4 limbs equally   Data Reviewed: reviewed   CBC    Component Value Date/Time   WBC 7.8 10/16/2023 0553   RBC 3.49 (L) 10/16/2023 0553   HGB 10.7 (L) 10/16/2023 0553   HGB 13.0 09/27/2017 1549   HCT 33.4 (L) 10/16/2023 0553   HCT 39.9 09/27/2017 1549   PLT 423 (H) 10/16/2023 0553   MCV 95.7 10/16/2023 0553   MCV 91 09/27/2017 1549   MCH 30.7 10/16/2023 0553  MCHC 32.0 10/16/2023 0553   RDW 13.1 10/16/2023 0553   RDW 13.8 09/27/2017 1549   LYMPHSABS 2.2 10/16/2023 0553   LYMPHSABS 2.6 09/27/2017 1549   MONOABS 0.6 10/16/2023 0553   EOSABS 0.9 (H) 10/16/2023 0553   EOSABS 0.0 09/27/2017 1549   BASOSABS 0.1 10/16/2023 0553   BASOSABS 0.0 09/27/2017 1549      Latest Ref Rng & Units 10/16/2023    5:53 AM 10/14/2023    9:48 AM 10/11/2023    5:13 AM  CMP  Glucose 70 - 99 mg/dL 829  562  130   BUN 6 - 20 mg/dL 10  7  10    Creatinine 0.44 - 1.00 mg/dL 8.65  7.84   6.96   Sodium 135 - 145 mmol/L 139  139  137   Potassium 3.5 - 5.1 mmol/L 3.7  3.8  4.0   Chloride 98 - 111 mmol/L 103  106  104   CO2 22 - 32 mmol/L 27  23  25    Calcium  8.9 - 10.3 mg/dL 9.2  9.4  9.4   Total Protein 6.5 - 8.1 g/dL 6.5  7.3  6.8   Total Bilirubin 0.0 - 1.2 mg/dL 0.4  <2.9  0.4   Alkaline Phos 38 - 126 U/L 83  96  97   AST 15 - 41 U/L 21  24  33   ALT 0 - 44 U/L 21  23  32     Scheduled Meds:  clonazePAM   1 mg Oral BID   DULoxetine   30 mg Oral QHS   DULoxetine   60 mg Oral QHS   enoxaparin  (LOVENOX ) injection  55 mg Subcutaneous Q24H   feeding supplement  1 Container Oral TID BM   fiber supplement (BANATROL TF)  60 mL Per Tube BID   insulin aspart  0-9 Units Subcutaneous Q4H   levETIRAcetam   500 mg Oral BID   levothyroxine   25 mcg Oral Daily   metoprolol  tartrate  50 mg Oral BID   multivitamin with minerals  1 tablet Oral Daily   nystatin cream   Topical BID   pantoprazole   40 mg Oral QHS   pregabalin   25 mg Oral Daily   QUEtiapine   200 mg Oral QHS   Rimegepant Sulfate  75 mg Oral QODAY   sodium chloride  flush  3 mL Intravenous Q12H   SUMAtriptan   100 mg Oral Daily   venlafaxine  XR  75 mg Oral Q breakfast   Continuous Infusions:  feeding supplement (VITAL 1.5 CAL) 50 mL/hr at 10/16/23 5284    Time  36  Verlie Glisson, MD  [facility]

## 2023-10-16 NOTE — Progress Notes (Signed)
 Patient ID: Kendra Parker, female   DOB: [birth year], 53 y.o.   MRN: [id number]    Progress Note   Subjective   Day # 19 CC; acute pancreatitis  Core track -tube feedings currently off  Labs today-lipase 34 WBC 7.8/hemoglobin 10.7/hematocrit 33.4 Sodium 139/potassium 3.7/BUN 10/creatinine 0.58 Albumin 3.0 LFTs within normal limits  Repeat CT last p.m. due to vomiting and increased pain-decompressed gallbladder unremarkable liver and biliary tree redemonstrated findings of acute pancreatitis similar to slightly decreased adjacent stranding and loosely organized fluid, most defined collection along the inferior pancreatic tail extending into the anterior pararenal space measuring 2.9 x 1.7 x 8.6 cm no evidence of pancreatic necrosis no ductal dilation.  There is narrowing of the SMV/splenic vein and portal vein at the confluence no evidence of thrombus  Patient says she really does not feel any better, looks about the same, says she does okay with some liquids but every time she tries to eat any food including things like applesauce or pudding and she develops fairly immediate epigastric pain worse than her baseline.  She says overall her level of pain is about the same this whole week No nausea or vomiting today Says she is scared and does not understand what is going on    Objective   Vital signs in last 24 hours: Temp:  [97.6 F (36.4 C)-98.5 F (36.9 C)] 98.3 F (36.8 C) (05/25 0916) Pulse Rate:  [80-93] 93 (05/25 0916) Resp:  [16-18] 18 (05/25 0916) BP: (98-135)/(60-84) 98/68 (05/25 0916) SpO2:  [96 %-97 %] 96 % (05/25 0916) Last BM Date : 10/14/23 General:    Older white female in NAD Heart:  Regular rate and rhythm; no murmurs Lungs: Respirations even and unlabored, lungs CTA bilaterally Abdomen:  Soft, obese, bowel sounds are present mains tender across the upper abdomen no rebound. Normal bowel sounds. Extremities:  Without edema. Neurologic:  Alert and oriented,  grossly  normal neurologically. Psych:  Cooperative. Normal mood and affect.  Intake/Output from previous day: 05/24 0701 - 05/25 0700 In: 2440.8 [NG/GT:2440.8] Out: -  Intake/Output this shift: Total I/O In: 1012.5 [NG/GT:1012.5] Out: -   Lab Results: Recent Labs    10/16/23 0553  WBC 7.8  HGB 10.7*  HCT 33.4*  PLT 423*   BMET Recent Labs    10/14/23 0948 10/16/23 0553  NA 139 139  K 3.8 3.7  CL 106 103  CO2 23 27  GLUCOSE 131* 150*  BUN 7 10  CREATININE 0.57 0.58  CALCIUM  9.4 9.2   LFT Recent Labs    10/16/23 0553  PROT 6.5  ALBUMIN 3.0*  AST 21  ALT 21  ALKPHOS 83  BILITOT 0.4   PT/INR No results for input(s): "LABPROT", "INR" in the last 72 hours.  Studies/Results: CT ABDOMEN PELVIS W CONTRAST Result Date: 10/16/2023 CLINICAL DATA:  Acute nonlocalized abdominal pain EXAM: CT ABDOMEN AND PELVIS WITH CONTRAST TECHNIQUE: Multidetector CT imaging of the abdomen and pelvis was performed using the standard protocol following bolus administration of intravenous contrast. RADIATION DOSE REDUCTION: This exam was performed according to the departmental dose-optimization program which includes automated exposure control, adjustment of the mA and/or kV according to patient size and/or use of iterative reconstruction technique. CONTRAST:  75mL OMNIPAQUE  IOHEXOL  350 MG/ML SOLN COMPARISON:  Abdominal radiograph 10/15/2023; CT abdomen pelvis 10/12/2023 FINDINGS: Lower chest: No acute abnormality. Hepatobiliary: Decompressed gallbladder. Unremarkable liver and biliary tree. Pancreas: Redemonstrated findings of acute pancreatitis. Similar to slightly decreased adjacent stranding and loosely organized  fluid. The most defined collection along the inferior pancreatic tail extending inferiorly in the anterior pararenal space. This measures 2.9 x 1.7 x 8.6 cm, previously 2.9 x 2.0 x 8.6 cm. No evidence of pancreatic necrosis. No ductal dilation. Spleen: Unremarkable. Adrenals/Urinary Tract:  Normal adrenal glands. No urinary calculi or hydronephrosis. Unremarkable bladder. Stomach/Bowel: Normal caliber large and small bowel. Enteric tube tip in the duodenum. No bowel wall thickening. Vascular/Lymphatic: There is narrowing of the SMV, splenic vein, and portal vein at the confluence. No evidence of thrombus. Aortic atherosclerosis. No enlarged abdominal or pelvic lymph nodes. Reproductive: Hysterectomy.  No adnexal mass. Other: No free intraperitoneal air. Musculoskeletal: No acute fracture. IMPRESSION: 1. Redemonstrated findings of acute pancreatitis. Similar to slightly decreased adjacent stranding and loosely organized fluid collections. 2. Narrowing of the SMV, splenic vein, and portal vein at the confluence due to pancreatitis. No evidence of thrombus. 3. Aortic Atherosclerosis (ICD10-I70.0). Electronically Signed   By: Rozell Cornet M.D.   On: 10/16/2023 03:35   DG ABD ACUTE 2+V W 1V CHEST Result Date: 10/15/2023 CLINICAL DATA:  Follow-up severe, acute pancreatitis with a clinical concern for developing small bowel obstruction. EXAM: DG ABDOMEN ACUTE WITH 1 VIEW CHEST COMPARISON:  Portable chest dated 10/02/2023 and abdomen and pelvis CT dated 10/12/2023. FINDINGS: Normal sized heart with a marked decrease in size. Clear lungs with normal vascularity. Feeding tube extending into the stomach. Unremarkable bones. The feeding tube tip is in the duodenal bulb. Air-fluid level in the stomach. The stomach is distended without abnormal dilatation. Normal bowel gas pattern. No free peritoneal air. Thoracic spine degenerative changes. IMPRESSION: 1. No acute abnormality. 2. Feeding tube tip in the duodenal bulb. 3. No evidence of bowel obstruction. Electronically Signed   By: Catherin Closs M.D.   On: 10/15/2023 16:47       Assessment / Plan:    #40 53 year old white female admitted 19 days ago with acute pancreatitis of unclear etiology.  This is suspected to be medication induced. Lipitor and  fibrate were held on admission Subsequent to that hospitalist stopped some of her migraine medications though those are felt to be less likely culprits  3 imaging studies on admission did not show any evidence of sludge or cholelithiasis however subsequent imaging has shown evidence of a small amount of sludge  Patient has had a prolonged course with persistent inability to tolerate p.o.'s due to increase in abdominal pain.  Labs are all reassuring at this time Repeat CT yesterday shows no evidence of pancreatic necrosis, persistent pancreatitis though some slightly decreased adjacent stranding, she is developing some fluid collections 1 measuring 2.9 x 1.7 x 8.6 cm inferior to the pancreatic tail and extending along the anterior pararenal space   Plan; resume postpyloric tube feeds Go back to clear liquids, advised her just to sip on liquids throughout the day, and resource Berry breeze again sipping throughout the day Encouraged patient to try to stop use of IV analgesics and just use oral pain medications and she is in agreement with this Urged her to be out of bed is much as possible and to be up and around  Had a long discussion about the prolonged nature of healing with acute moderate to severe pancreatitis.  She knows that she is developing some fluid collections but hopefully these will resolve with time Advised her that her barriers to going home are inability to keep down p.o.'s and pain medications.  When she is able to keep down liquids consistently and  can get herself off of IV narcotics and on oral analgesic and she will be able to be discharged.     Principal Problem:   Pancreatitis Active Problems:   Migraine   Insomnia   Dyspnea   Essential hypertension   Sinus tachycardia   Constipation   Chronic pain   Hypothyroidism   Dyslipidemia   Obesity, Class III, BMI 40-49.9 (morbid obesity)   Epigastric pain   Nausea without vomiting     LOS: 17 days   Dejay Kronk  EsterwoodPA-C  10/16/2023, 1:10 PM

## 2023-10-17 DIAGNOSIS — K859 Acute pancreatitis without necrosis or infection, unspecified: Secondary | ICD-10-CM | POA: Diagnosis not present

## 2023-10-17 LAB — GLUCOSE, CAPILLARY: Glucose-Capillary: 103 mg/dL — ABNORMAL HIGH (ref 70–99)

## 2023-10-17 MED ORDER — HYDROMORPHONE HCL 1 MG/ML IJ SOLN
0.5000 mg | INTRAMUSCULAR | Status: DC | PRN
  Administered 2023-10-17 – 2023-10-21 (×10): 0.5 mg via INTRAVENOUS
  Filled 2023-10-17 (×11): qty 0.5

## 2023-10-17 NOTE — Progress Notes (Signed)
 [facility] ROUNDING NOTE CORDELLA NYQUIST [id number]  DOB: 31-May-1970  DOA: 09/27/2023  PCP: Joenathan Muslim, FNP  10/17/2023,10:42 AM  LOS: 18 days    Code Status: Full code   from: Home current Dispo: Likely home eventually   53 year old white female Known history of chronic migraines, HTN, dyslipidemia Developed severe abdominal pain 5/5 went to PCP and sent to ED CT abdomen/pelvis showed acute interstitial and edematous pancreatitis without evidence of necrosis with fluid collection-ultrasound showed hepatic fatty infiltration unremarkable gallbladder--heart rate 140s lipase 98 AST 250  Her chronic migraine meds were adjusted downwards Eventually a core track was placed for feeds on 5/12  Plan  Acute pancreatitis unclear etiology Causes and likely secondary to meds for migraines-she has not durably improved so she needs to Continues on tube feeds at 50 cc/h, boost and Banatrol Repeat CT performed 5/24 no changes-no ileus given no nausea no vomiting over the past several days I will place her back on a soft diet GI reassessed on 5/25 and have no other recommendations other than interval cholecystectomy once she gets better from this hospital stay and we appreciate their input I have mentioned on several days we will cut back pain meds and we are going to 0.5 of Dilaudid  every 4 and she is to progressively ambulate nursing is aware  Migraine headaches--Home regimen is complex and includes Vyepti 300 every 3 months, Keppra  500 twice daily, Nurtec 75 every other day, Maxalt  10 by mouth as needed (no more than 4 doses of any triptan per week), Imitrex  maximum 3 doses per week--Ubrelvy  maximum 2 doses per week Back on Maxalt  10 as needed migraine, Imitrex  100 daily, Ubrelvy  100 daily as needed breakthrough migraine-also on Keppra  500 twice daily, methocarbamol  750 every 8 as needed, Lyrica  25 daily Headache seems to be at baseline continue her on what she is on currently  HTN Continue metoprolol   50 twice daily Holding lisinopril 10 and Aldactone 25 f  Impaired glucose tolerance CBGs ranging below 180s.  Sliding scale coverage  Depression Resume Seroquel  200 at bedtime, Effexor  75 daily, Cymbalta  90 at bedtime  ?  Seizure continue Keppra  500 twice daily   DVT prophylaxis: Lovenox   Status is: Inpatient Remains inpatient appropriate because:   Requires no durable improvement prior to being able to discharge    Subjective:  Pain remains unchanged no nausea no vomiting however passing gas no stool recently Discussed overall plan with her she is understandably frustrated and unclear as to how to proceed I have told her we are minimizing her IV pain meds and have encouraged her to walk about more frequently and we also spoke to nursing about this  Objective + exam Vitals:   10/16/23 0916 10/16/23 1709 10/16/23 2126 10/17/23 0821  BP: 98/68 125/71 (!) 149/61 (!) 116/58  Pulse: 93 (!) 107 (!) 103 85  Resp: 18 18 18 18   Temp: 98.3 F (36.8 C) 98.3 F (36.8 C) 97.7 F (36.5 C) 97.9 F (36.6 C)  TempSrc:   Oral   SpO2: 96% 96% 96% 94%  Weight:      Height:       Filed Weights   10/13/23 1115 10/14/23 0440 10/15/23 0500  Weight: 104.6 kg 112 kg 111.7 kg    Examination:  Obese pleasant but a little frustrated Chest is clear S1-S2 no murmur Mild epigastric tenderness ROM intact Power 5/5 No lower extremity edema  Data Reviewed: reviewed   CBC    Component Value Date/Time  WBC 7.8 10/16/2023 0553   RBC 3.49 (L) 10/16/2023 0553   HGB 10.7 (L) 10/16/2023 0553   HGB 13.0 09/27/2017 1549   HCT 33.4 (L) 10/16/2023 0553   HCT 39.9 09/27/2017 1549   PLT 423 (H) 10/16/2023 0553   MCV 95.7 10/16/2023 0553   MCV 91 09/27/2017 1549   MCH 30.7 10/16/2023 0553   MCHC 32.0 10/16/2023 0553   RDW 13.1 10/16/2023 0553   RDW 13.8 09/27/2017 1549   LYMPHSABS 2.2 10/16/2023 0553   LYMPHSABS 2.6 09/27/2017 1549   MONOABS 0.6 10/16/2023 0553   EOSABS 0.9 (H)  10/16/2023 0553   EOSABS 0.0 09/27/2017 1549   BASOSABS 0.1 10/16/2023 0553   BASOSABS 0.0 09/27/2017 1549      Latest Ref Rng & Units 10/16/2023    5:53 AM 10/14/2023    9:48 AM 10/11/2023    5:13 AM  CMP  Glucose 70 - 99 mg/dL 981  191  478   BUN 6 - 20 mg/dL 10  7  10    Creatinine 0.44 - 1.00 mg/dL 2.95  6.21  3.08   Sodium 135 - 145 mmol/L 139  139  137   Potassium 3.5 - 5.1 mmol/L 3.7  3.8  4.0   Chloride 98 - 111 mmol/L 103  106  104   CO2 22 - 32 mmol/L 27  23  25    Calcium  8.9 - 10.3 mg/dL 9.2  9.4  9.4   Total Protein 6.5 - 8.1 g/dL 6.5  7.3  6.8   Total Bilirubin 0.0 - 1.2 mg/dL 0.4  <6.5  0.4   Alkaline Phos 38 - 126 U/L 83  96  97   AST 15 - 41 U/L 21  24  33   ALT 0 - 44 U/L 21  23  32     Scheduled Meds:  clonazePAM   1 mg Oral BID   DULoxetine   30 mg Oral QHS   DULoxetine   60 mg Oral QHS   enoxaparin  (LOVENOX ) injection  55 mg Subcutaneous Q24H   feeding supplement  1 Container Oral TID BM   fiber supplement (BANATROL TF)  60 mL Per Tube BID   levETIRAcetam   500 mg Oral BID   levothyroxine   25 mcg Oral Daily   metoprolol  tartrate  50 mg Oral BID   multivitamin with minerals  1 tablet Oral Daily   nystatin cream   Topical BID   pantoprazole   40 mg Oral QHS   pregabalin   25 mg Oral Daily   QUEtiapine   200 mg Oral QHS   Rimegepant Sulfate  75 mg Oral QODAY   sodium chloride  flush  3 mL Intravenous Q12H   SUMAtriptan   100 mg Oral Daily   venlafaxine  XR  75 mg Oral Q breakfast   Continuous Infusions:  feeding supplement (VITAL 1.5 CAL) Stopped (10/17/23 1033)    Time  36  Verlie Glisson, MD  [facility]

## 2023-10-18 DIAGNOSIS — K859 Acute pancreatitis without necrosis or infection, unspecified: Secondary | ICD-10-CM | POA: Diagnosis not present

## 2023-10-18 LAB — GLUCOSE, CAPILLARY
Glucose-Capillary: 140 mg/dL — ABNORMAL HIGH (ref 70–99)
Glucose-Capillary: 156 mg/dL — ABNORMAL HIGH (ref 70–99)
Glucose-Capillary: 169 mg/dL — ABNORMAL HIGH (ref 70–99)
Glucose-Capillary: 183 mg/dL — ABNORMAL HIGH (ref 70–99)

## 2023-10-18 MED ORDER — SMOG ENEMA
300.0000 mL | Freq: Once | RECTAL | Status: DC
  Filled 2023-10-18: qty 960

## 2023-10-18 MED ORDER — KETOROLAC TROMETHAMINE 30 MG/ML IJ SOLN
30.0000 mg | Freq: Four times a day (QID) | INTRAMUSCULAR | Status: DC | PRN
  Administered 2023-10-18: 30 mg via INTRAVENOUS
  Filled 2023-10-18: qty 1

## 2023-10-18 MED ORDER — POLYETHYLENE GLYCOL 3350 17 G PO PACK
17.0000 g | PACK | Freq: Every day | ORAL | Status: DC
  Administered 2023-10-19 – 2023-10-22 (×4): 17 g via ORAL
  Filled 2023-10-18 (×5): qty 1

## 2023-10-18 MED ORDER — DIPHENHYDRAMINE HCL 50 MG/ML IJ SOLN
25.0000 mg | Freq: Four times a day (QID) | INTRAMUSCULAR | Status: DC | PRN
  Administered 2023-10-18: 25 mg via INTRAVENOUS
  Filled 2023-10-18: qty 1

## 2023-10-18 MED ORDER — METOCLOPRAMIDE HCL 5 MG/ML IJ SOLN
10.0000 mg | Freq: Four times a day (QID) | INTRAMUSCULAR | Status: DC
  Administered 2023-10-18 – 2023-10-21 (×11): 10 mg via INTRAVENOUS
  Filled 2023-10-18 (×11): qty 2

## 2023-10-18 MED ORDER — VITAL 1.5 CAL PO LIQD
600.0000 mL | ORAL | Status: DC
  Administered 2023-10-18 – 2023-10-20 (×3): 600 mL
  Filled 2023-10-18 (×4): qty 711

## 2023-10-18 NOTE — Progress Notes (Signed)
 Nutrition Follow-up  DOCUMENTATION CODES:   Morbid obesity  INTERVENTION:  Modify TF via small bore feeding tube: Vital1.5 at 60 ml/h from 8PM-6AM(600 ml per day)    Provides 900 kcal (53% of estimated needs), 41 gm protein (55% of estimated needs), 458 ml free water daily   Initiate calorie count to assess for ability to wean tube feeding Continue MVI w/ minerals Continue Banatrol BID-provides 45kcal, 5g soluble fiber and 2g protein per serving.  Monitor diet advancement and tolerance Continue Boost Breeze po TID, each supplement provides 250 kcal and 9 grams of protein   NUTRITION DIAGNOSIS:  Inadequate oral intake related to inability to eat, poor appetite, acute illness as evidenced by per patient/family report, estimated needs. - remains applicable  GOAL:  Patient will meet greater than or equal to 90% of their needs - progressing  MONITOR:  PO intake, TF tolerance, Supplement acceptance  REASON FOR ASSESSMENT:  Consult Assessment of nutrition requirement/status, Enteral/tube feeding initiation and management  ASSESSMENT:  Pt with PMH significant for: HTN, HLD, chronic migraine headaches presented to hospital w/ abdominal pain x2.5 weeks. Reported it radiating into her back accompanied by N/V. CT revealed acute pancreatitis. RUG ultrasound showed fatty liver.  5/6 admitted, clear liquid diet 5/8 NPO  5/9 MRI/MRCP: markedly increased moderate peripancreatic edema w/ areas of relatively ill-defined fluid signal seen 5/10 clear liquid diet 5/12 Cortrak placed - unable to get post-pyloric; trickle TF initiated 5/13 TF advanced 5/16 Cortrak advanced by IR to post pyloric  5/17 Cortrak dislodged 5/19 Cortrak replaced, TF re-initiated  5/20 advanced to full liquid diet 5/25 clear liquid diet 5/27 nocturnal tube feedings; calorie count   Advanced to full liquid diet 5/20. Back to clear liquids on 5/25. She is slowly progressing, per GI and attending.    Average Meal  Intake 5/21: 50-75% x3 documented meals 5/23: 50-100% x3 documented meals 5/27: 25% x1 documented meal   Intake has picked up in last few days. MD requesting nocturnal tube feed regimen to allow for intake to increase. Amicable to full liquid diet to attempt to sufficiently meet needs. Will start calorie count as well to assess ability to wean.   Met with patient at bedside to discuss. She has questions around why she is unable to tolerate oral intake. Discussed potential etiologies. She reported no BM x5 days up until yesterday. She prefers to leave Banatrol in place as her stools were quite loose.   Discussed the need for increased intake to reduce tube feed and remove Cortrak. She verbalizes understanding. While she states she is fine not meeting needs in an effort to lose weight, encouraged weight loss in a safe and healthy manner.    Admit Weight: 110kg Current Weight: 106kg   No edema on exam. Bowels stable. Did encourage d/c of Banatrol is she is going 3-4 days without BM. Will hopefully increase with introduction of more whole foods with diet advancement.   Drains/Lines: Small bore feeding tube (duodenum) placed 5/19 UOP: + 1 unmeasured occurrences   Meds: clonazepan, SSI 0-9 q4, levothyroxine , MVI, pantoprazole ,    Labs: no new labs to review   Diet Order:   Diet Order             Diet clear liquid Room service appropriate? Yes; Fluid consistency: Thin  Diet effective now            EDUCATION NEEDS:   Education needs have been addressed  Skin:  Skin Assessment: Reviewed RN Assessment  Last BM:  5/21 - type 7 x1  Height:  Ht Readings from Last 1 Encounters:  09/27/23 5\' 3"  (1.6 m)   Weight:  Wt Readings from Last 1 Encounters:  10/18/23 106 kg   Ideal Body Weight:  52.3 kg  BMI:  Body mass index is 41.4 kg/m.  Estimated Nutritional Needs:   Kcal:  1700-1900 kcals  Protein:  75-90g  Fluid:  >1.7L/day  Con Decant MS, RD, LDN Registered  Dietitian Clinical Nutrition RD Inpatient Contact Info in Amion

## 2023-10-18 NOTE — Progress Notes (Signed)
 [facility] ROUNDING NOTE Kendra Parker [id number]:[id number]  DOB: 08/07/1970  DOA: 09/27/2023  PCP: Joenathan Muslim, FNP  10/18/2023,2:21 PM  LOS: 19 days    Code Status: Full code   from: Home current Dispo: Likely home eventually   53 year old white female Known history of chronic migraines, HTN, dyslipidemia Developed severe abdominal pain 5/5 went to PCP and sent to ED CT abdomen/pelvis showed acute interstitial and edematous pancreatitis without evidence of necrosis with fluid collection-ultrasound showed hepatic fatty infiltration unremarkable gallbladder--heart rate 140s lipase 98 AST 250  Her chronic migraine meds were adjusted downwards Eventually a core track was placed for feeds on 5/12  Plan  Acute pancreatitis unclear etiology Causes and likely secondary to meds for migraines-she has not durably improved so she needs to continue meds Repeat CT performed 5/24 no changes-no ileus given no nausea no vomiting over the past several days I will place her back on a soft diet GI reassessed on 5/25 and have no other recommendations other than interval cholecystectomy once she gets better from this hospital stay and we appreciate their input Asking nutritionist to place on hs feeds and graduate day time diet--she was abel to eat Jell-o and grits today--so slight improvmeent  Migraine headaches--Home regimen is complex and includes Vyepti 300 every 3 months, Keppra  500 twice daily, Nurtec 75 every other day, Maxalt  10 by mouth as needed (no more than 4 doses of any triptan per week), Imitrex  maximum 3 doses per week--Ubrelvy  maximum 2 doses per week Back on Maxalt  10 as needed migraine, Imitrex  100 daily, Ubrelvy  100 daily as needed breakthrough migraine-also on Keppra  500 twice daily, methocarbamol  750 every 8 as needed, Lyrica  25 daily Headache seems to be at baseline--no overt changes continue her on what she is on currently  HTN Continue metoprolol  50 twice daily Holding lisinopril 10 and  Aldactone 25   Impaired glucose tolerance CBGs ranging below 180s.  Sliding scale coverage  Depression Resume Seroquel  200 at bedtime, Effexor  75 daily, Cymbalta  90 at bedtime  ?  Seizure continue Keppra  500 twice daily   DVT prophylaxis: Lovenox   Status is: Inpatient Remains inpatient appropriate because:   Requires no durable improvement prior to being able to discharge    Subjective:  Pain seems a little better No fever Eating a little better she is ok switching to nocturnal feeds and trying to eat during daytime Was constipated for several days had a good stool today--keep on miralax    Objective + exam Vitals:   10/17/23 1643 10/17/23 2031 10/18/23 0528 10/18/23 0749  BP: 132/61 123/80 130/75 125/79  Pulse: 76 86 80 86  Resp: 18 18 18 19   Temp: 98.3 F (36.8 C) 98 F (36.7 C) (!) 97.5 F (36.4 C)   TempSrc:      SpO2: 97% 98% 100% 96%  Weight:   106 kg   Height:       Filed Weights   10/14/23 0440 10/15/23 0500 10/18/23 0528  Weight: 112 kg 111.7 kg 106 kg    Examination:  Obese pleasant but a little frustrated Chest is clear S1-S2 no murmur Mild epigastric tenderness--seems softer ROM intact Power 5/5 No lower extremity edema  Data Reviewed: reviewed   CBC    Component Value Date/Time   WBC 7.8 10/16/2023 0553   RBC 3.49 (L) 10/16/2023 0553   HGB 10.7 (L) 10/16/2023 0553   HGB 13.0 09/27/2017 1549   HCT 33.4 (L) 10/16/2023 0553   HCT 39.9 09/27/2017 1549  PLT 423 (H) 10/16/2023 0553   MCV 95.7 10/16/2023 0553   MCV 91 09/27/2017 1549   MCH 30.7 10/16/2023 0553   MCHC 32.0 10/16/2023 0553   RDW 13.1 10/16/2023 0553   RDW 13.8 09/27/2017 1549   LYMPHSABS 2.2 10/16/2023 0553   LYMPHSABS 2.6 09/27/2017 1549   MONOABS 0.6 10/16/2023 0553   EOSABS 0.9 (H) 10/16/2023 0553   EOSABS 0.0 09/27/2017 1549   BASOSABS 0.1 10/16/2023 0553   BASOSABS 0.0 09/27/2017 1549      Latest Ref Rng & Units 10/16/2023    5:53 AM 10/14/2023    9:48 AM  10/11/2023    5:13 AM  CMP  Glucose 70 - 99 mg/dL 098  119  147   BUN 6 - 20 mg/dL 10  7  10    Creatinine 0.44 - 1.00 mg/dL 8.29  5.62  1.30   Sodium 135 - 145 mmol/L 139  139  137   Potassium 3.5 - 5.1 mmol/L 3.7  3.8  4.0   Chloride 98 - 111 mmol/L 103  106  104   CO2 22 - 32 mmol/L 27  23  25    Calcium  8.9 - 10.3 mg/dL 9.2  9.4  9.4   Total Protein 6.5 - 8.1 g/dL 6.5  7.3  6.8   Total Bilirubin 0.0 - 1.2 mg/dL 0.4  <8.6  0.4   Alkaline Phos 38 - 126 U/L 83  96  97   AST 15 - 41 U/L 21  24  33   ALT 0 - 44 U/L 21  23  32     Scheduled Meds:  clonazePAM   1 mg Oral BID   DULoxetine   30 mg Oral QHS   DULoxetine   60 mg Oral QHS   enoxaparin  (LOVENOX ) injection  55 mg Subcutaneous Q24H   feeding supplement  1 Container Oral TID BM   fiber supplement (BANATROL TF)  60 mL Per Tube BID   levETIRAcetam   500 mg Oral BID   levothyroxine   25 mcg Oral Daily   metoprolol  tartrate  50 mg Oral BID   multivitamin with minerals  1 tablet Oral Daily   pantoprazole   40 mg Oral QHS   polyethylene glycol  17 g Oral Daily   pregabalin   25 mg Oral Daily   QUEtiapine   200 mg Oral QHS   Rimegepant Sulfate  75 mg Oral QODAY   sodium chloride  flush  3 mL Intravenous Q12H   SMOG  300 mL Rectal Once   SUMAtriptan   100 mg Oral Daily   venlafaxine  XR  75 mg Oral Q breakfast   Continuous Infusions:  feeding supplement (VITAL 1.5 CAL) Stopped (10/17/23 1033)    Time  30  Verlie Glisson, MD  [facility]

## 2023-10-18 NOTE — TOC Progression Note (Signed)
 Transition of Care [facility]) - Progression Note    Patient Details  Name: Kendra Parker MRN: [id number] Date of Birth: 05/07/1971  Transition of [facility]) CM/SW Contact  Tom-Johnson, Jem Castro Daphne, RN Phone Number: 10/18/2023, 2:36 PM  Clinical Narrative:     Patient with some improvement with tolerating meals. Switching to Nocturnal feeds via Cortrak and eat meals during daytime.   Patient not Medically ready for discharge.  CM will continue to follow as patient progresses with care towards discharge.         Expected Discharge Plan and Services                                               Social Determinants of Health (SDOH) Interventions SDOH Screenings   Food Insecurity: No Food Insecurity (09/27/2023)  Housing: Low Risk  (09/27/2023)  Transportation Needs: No Transportation Needs (09/27/2023)  Utilities: Not At Risk (09/27/2023)  Tobacco Use: Medium Risk (09/27/2023)    Readmission Risk Interventions     No data to display

## 2023-10-19 DIAGNOSIS — K859 Acute pancreatitis without necrosis or infection, unspecified: Secondary | ICD-10-CM | POA: Diagnosis not present

## 2023-10-19 LAB — GLUCOSE, CAPILLARY
Glucose-Capillary: 130 mg/dL — ABNORMAL HIGH (ref 70–99)
Glucose-Capillary: 131 mg/dL — ABNORMAL HIGH (ref 70–99)
Glucose-Capillary: 113 mg/dL — ABNORMAL HIGH (ref 70–99)
Glucose-Capillary: 125 mg/dL — ABNORMAL HIGH (ref 70–99)

## 2023-10-19 MED ORDER — PREGABALIN 25 MG PO CAPS
25.0000 mg | ORAL_CAPSULE | Freq: Two times a day (BID) | ORAL | Status: DC
  Administered 2023-10-19 – 2023-10-21 (×5): 25 mg via ORAL
  Filled 2023-10-19 (×4): qty 1

## 2023-10-19 MED ORDER — PANTOPRAZOLE SODIUM 40 MG PO TBEC
40.0000 mg | DELAYED_RELEASE_TABLET | Freq: Two times a day (BID) | ORAL | Status: DC
  Administered 2023-10-19 – 2023-10-22 (×6): 40 mg via ORAL
  Filled 2023-10-19 (×6): qty 1

## 2023-10-19 MED ORDER — DIPHENHYDRAMINE HCL 50 MG/ML IJ SOLN: 25.0000 mg | Freq: Four times a day (QID) | INTRAMUSCULAR | Status: DC | PRN

## 2023-10-19 MED ORDER — ACETAMINOPHEN 500 MG PO TABS
500.0000 mg | ORAL_TABLET | Freq: Three times a day (TID) | ORAL | Status: DC
  Administered 2023-10-19 – 2023-10-22 (×8): 500 mg via ORAL
  Filled 2023-10-19 (×9): qty 1

## 2023-10-19 MED ORDER — KETOROLAC TROMETHAMINE 30 MG/ML IJ SOLN
30.0000 mg | Freq: Four times a day (QID) | INTRAMUSCULAR | Status: DC | PRN
  Administered 2023-10-20: 30 mg via INTRAVENOUS
  Filled 2023-10-19: qty 1

## 2023-10-19 MED ORDER — UBROGEPANT 100 MG PO TABS
100.0000 mg | ORAL_TABLET | Freq: Every day | ORAL | Status: DC | PRN
  Administered 2023-10-22: 100 mg via ORAL
  Filled 2023-10-19 (×3): qty 1

## 2023-10-19 NOTE — Progress Notes (Signed)
 [facility] Progress Note Patient: Kendra Parker YQM:[id number] DOB: 02-Sep-1970 DOA: 09/27/2023  DOS: the patient was seen and examined on 10/19/2023  Brief Hospital Course: 53 year old with HTN, HLD, chronic migraine headaches comes into the hospital with dull abdominal pain for the past 2 and half weeks, radiating into her back. This has been associated with nausea, vomiting. Given persistent symptoms presented to the ER, and a CT scan revealed acute pancreatitis with no necrosis or fluid collection. Right upper quadrant ultrasound showed fatty liver, and gallbladder was unremarkable. She was admitted to the hospital.   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 Assessment and Plan: Acute pancreatitis likely drug-induced Gallbladder sludge. continuing supportive care. GI have been consulted. CT scan on admission without complicating factors such as abscesses or necrosis.  There is no gallbladder pathology, no cholelithiasis or choledocholithiasis seen on imaging.  There are some sludge seen. She is following with neurology as an outpatient and is on Ubrelvy , Vyepti, sumatriptan . Migraine meds being held incase they may be related to pt's pancreatitis NG tube placed postpyloric.  Currently on tube feeding. Continue pain control although minimize narcotics. Discontinue sumatriptan  as per my discussion with patient on 5/28.   Migraine headaches- on multiple medications at home, follows with neurology as an outpatient.  GI initially recommended to hold off on resuming migraine meds given chance that migraine meds may be related to pancreatitis.  Headache had historically improved with reglan , toradol , and benadryl  Discontinue sumatriptan  as there is increased risk of pancreatitis. Continue other medications as home regimen.   Hyperlipidemia- continue statin   Hypothyroidism- continue Synthroid    Hypertension- continue metoprolol , blood pressure acceptable,  elevated at times due to pain   Obesity, class III- Body mass index is 41.59 kg/m.  Outpatient correlation with PCP recommended.  Subjective: Reports headache.  Reports nausea reports no vomiting.  Minimal oral intake.  Reports 10 out of 10 pain in the abdomen.  Passing gas.  Physical Exam: Patient General: in Mild distress, No Rash Cardiovascular: S1 and S2 Present, No Murmur Respiratory: Good respiratory effort, Bilateral Air entry present. No Crackles, No wheezes Abdomen: Bowel Sound present, severe abdominal tenderness Extremities: No edema Neuro: Alert and oriented x3, no new focal deficit  Data Reviewed: I have Reviewed nursing notes, Vitals, and Lab results. Since last encounter, pertinent lab results CBC and BMP   . I have ordered test including CBC and BMP  .   Disposition: Status is: Inpatient Remains inpatient appropriate because: Monitor for improvement in symptom control and oral intake  Family Communication: No one at bedside Level of care: Telemetry Medical   Vitals:   10/19/23 0443 10/19/23 0818 10/19/23 1107 10/19/23 1623  BP: 112/68 107/70 121/83 127/83  Pulse: 75 78 80 73  Resp: 17 17 18 17   Temp: 97.6 F (36.4 C) (!) 97.5 F (36.4 C) 97.6 F (36.4 C) 97.9 F (36.6 C)  TempSrc: Oral     SpO2: 97% 96% 98% 100%  Weight: 106.5 kg     Height:         Author: Charlean Congress, MD 10/19/2023 6:06 PM  Please look on [email or web address] to find out who is on call.

## 2023-10-20 DIAGNOSIS — K859 Acute pancreatitis without necrosis or infection, unspecified: Secondary | ICD-10-CM | POA: Diagnosis not present

## 2023-10-20 LAB — COMPREHENSIVE METABOLIC PANEL WITH GFR
ALT: 35 U/L (ref 0–44)
AST: 37 U/L (ref 15–41)
Albumin: 3.1 g/dL — ABNORMAL LOW (ref 3.5–5.0)
Alkaline Phosphatase: 85 U/L (ref 38–126)
Anion gap: 7 (ref 5–15)
BUN: 9 mg/dL (ref 6–20)
CO2: 25 mmol/L (ref 22–32)
Calcium: 9.1 mg/dL (ref 8.9–10.3)
Chloride: 107 mmol/L (ref 98–111)
Creatinine, Ser: 0.54 mg/dL (ref 0.44–1.00)
GFR, Estimated: >60 mL/min (ref >60)
Glucose, Bld: 143 mg/dL — ABNORMAL HIGH (ref 70–99)
Potassium: 3.9 mmol/L (ref 3.5–5.1)
Sodium: 139 mmol/L (ref 135–145)
Total Bilirubin: 0.3 mg/dL (ref 0.0–1.2)
Total Protein: 6.4 g/dL — ABNORMAL LOW (ref 6.5–8.1)

## 2023-10-20 LAB — GLUCOSE, CAPILLARY
Glucose-Capillary: 135 mg/dL — ABNORMAL HIGH (ref 70–99)
Glucose-Capillary: 138 mg/dL — ABNORMAL HIGH (ref 70–99)
Glucose-Capillary: 151 mg/dL — ABNORMAL HIGH (ref 70–99)
Glucose-Capillary: 154 mg/dL — ABNORMAL HIGH (ref 70–99)
Glucose-Capillary: 158 mg/dL — ABNORMAL HIGH (ref 70–99)
Glucose-Capillary: 165 mg/dL — ABNORMAL HIGH (ref 70–99)

## 2023-10-20 LAB — CBC WITH DIFFERENTIAL/PLATELET
Abs Immature Granulocytes: 0.03 10*3/uL (ref 0.00–0.07)
Basophils Absolute: 0.1 10*3/uL (ref 0.0–0.1)
Basophils Relative: 1 %
Eosinophils Absolute: 0.6 10*3/uL — ABNORMAL HIGH (ref 0.0–0.5)
Eosinophils Relative: 9 %
HCT: 33.7 % — ABNORMAL LOW (ref 36.0–46.0)
Hemoglobin: 10.8 g/dL — ABNORMAL LOW (ref 12.0–15.0)
Immature Granulocytes: 0 %
Lymphocytes Relative: 28 %
Lymphs Abs: 2 10*3/uL (ref 0.7–4.0)
MCH: 30.3 pg (ref 26.0–34.0)
MCHC: 32 g/dL (ref 30.0–36.0)
MCV: 94.4 fL (ref 80.0–100.0)
Monocytes Absolute: 0.7 10*3/uL (ref 0.1–1.0)
Monocytes Relative: 9 %
Neutro Abs: 3.8 10*3/uL (ref 1.7–7.7)
Neutrophils Relative %: 53 %
Platelets: 359 10*3/uL (ref 150–400)
RBC: 3.57 MIL/uL — ABNORMAL LOW (ref 3.87–5.11)
RDW: 13.2 % (ref 11.5–15.5)
WBC: 7.2 10*3/uL (ref 4.0–10.5)
nRBC: 0 % (ref 0.0–0.2)

## 2023-10-20 LAB — MAGNESIUM: Magnesium: 1.8 mg/dL (ref 1.7–2.4)

## 2023-10-20 LAB — LIPASE, BLOOD: Lipase: 29 U/L (ref 11–51)

## 2023-10-20 MED ORDER — RIMEGEPANT SULFATE 75 MG PO TBDP
75.0000 mg | ORAL_TABLET | ORAL | Status: DC
  Administered 2023-10-21: 75 mg via ORAL
  Filled 2023-10-20 (×2): qty 1

## 2023-10-20 NOTE — Progress Notes (Signed)
 [facility] Progress Note Patient: Kendra Parker ZOX:[id number] DOB: Aug 09, 1970 DOA: 09/27/2023  DOS: the patient was seen and examined on 10/20/2023  Brief Hospital Course: 53 year old with HTN, HLD, chronic migraine headaches comes into the hospital with dull abdominal pain for the past 2 and half weeks, radiating into her back. This has been associated with nausea, vomiting. Given persistent symptoms presented to the ER, and a CT scan revealed acute pancreatitis with no necrosis or fluid collection. Right upper quadrant ultrasound showed fatty liver, and gallbladder was unremarkable. She was admitted to the hospital.   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  Assessment and Plan: Acute pancreatitis likely drug-induced Gallbladder sludge. GI have been consulted. CT scan on admission without complicating factors such as abscesses or necrosis.  There is no gallbladder pathology, no cholelithiasis or choledocholithiasis seen on imaging.  There are some sludge seen. She is following with neurology as an outpatient and is on Ubrelvy , Vyepti, sumatriptan . Migraine meds being held incase they may be related to pt's pancreatitis NG tube placed postpyloric.  Currently on tube feeding at nighttime. Continue pain control although minimize narcotics. Discontinue sumatriptan  as per my discussion with patient on 5/28.   Migraine headaches- on multiple medications at home, follows with neurology as an outpatient.  GI initially recommended to hold off on resuming migraine meds given chance that migraine meds may be related to pancreatitis.  Headache had historically improved with reglan , toradol , and benadryl  Discontinue sumatriptan  as there is increased risk of pancreatitis. Continue other medications as home regimen.   Hyperlipidemia- continue statin   Hypothyroidism- continue Synthroid    Hypertension- continue metoprolol , blood pressure acceptable, elevated at  times due to pain   Obesity, class III- Body mass index is 43.9 kg/m.  Outpatient correlation with PCP recommended.   Subjective: No nausea.  Abdominal pain still present.  No fever no chills.  Passing gas.  Had 2 BM.   Physical Exam: General: in Mild distress, No Rash Cardiovascular: S1 and S2 Present, No Murmur Respiratory: Good respiratory effort, Bilateral Air entry present. No Crackles, No wheezes Abdomen: Bowel Sound present, mild diffuse tenderness Extremities: Trace edema Neuro: Alert and oriented x3, no new focal deficit  Data Reviewed: I have Reviewed nursing notes, Vitals, and Lab results. Since last encounter, pertinent lab results CBC and BMP   . I have ordered test including none  .   Disposition: Status is: Inpatient Remains inpatient appropriate because: Monitor for improvement in oral intake and pain control  Family Communication: No one at bedside Level of care: Telemetry Medical   Vitals:   10/19/23 2020 10/20/23 0409 10/20/23 0744 10/20/23 1700  BP: 113/63 109/71 138/79 (!) 140/86  Pulse: 80 79 91 93  Resp: 18 18  19   Temp: 97.9 F (36.6 C) 98.3 F (36.8 C)  98.2 F (36.8 C)  TempSrc:      SpO2: 98% 98% 100% 98%  Weight:  112.4 kg    Height:         Author: Charlean Congress, MD 10/20/2023 6:27 PM  Please look on [email or web address] to find out who is on call.

## 2023-10-20 NOTE — Progress Notes (Signed)
 Calorie Count Note  48 hour calorie count ordered and now completed.  Diet: Full Liquid Diet Supplements: Boost Breeze TID  Estimated Nutritional Needs:  Kcal:  1700-1900 kcals Protein:  75-90g Fluid:  >1.7L/day  5/27-5/28 Dinner: No documentation to review Breakfast: 50% of Jell-O Lunch: 25% Supplements: Boost Breeze TID  Total intake Day 1: *** kcal (***% of minimum estimated needs)  *** protein (***% of minimum estimated needs)  5/28-5/29 Dinner: 25% Breakfast: 20% Lunch: 100% cream of potato soup, 75% peach gelatin Supplements: ***  Total intake Day 2: *** kcal (***% of minimum estimated needs)  *** protein (***% of minimum estimated needs)  INTERVENTION:  Modify TF via small bore feeding tube: Vital1.5 at 60 ml/h from 8PM-6AM(600 ml per day)    Provides 900 kcal (53% of estimated needs), 41 gm protein (55% of estimated needs), 458 ml free water daily    Initiate calorie count to assess for ability to wean tube feeding Continue MVI w/ minerals Continue Banatrol BID-provides 45kcal, 5g soluble fiber and 2g protein per serving.  Monitor diet advancement and tolerance Continue Boost Breeze po TID, each supplement provides 250 kcal and 9 grams of protein    NUTRITION DIAGNOSIS:  Inadequate oral intake related to inability to eat, poor appetite, acute illness as evidenced by per patient/family report, estimated needs. - remains applicable   GOAL:  Patient will meet greater than or equal to 90% of their needs - progressing   MONITOR:  PO intake, TF tolerance, Supplement acceptance  Con Decant MS, RD, LDN Registered Dietitian Clinical Nutrition RD Inpatient Contact Info in Amion

## 2023-10-20 NOTE — Plan of Care (Signed)
  Problem: Clinical Measurements: Goal: Diagnostic test results will improve Outcome: Progressing Goal: Respiratory complications will improve Outcome: Progressing Goal: Cardiovascular complication will be avoided Outcome: Progressing   Problem: Activity: Goal: Risk for activity intolerance will decrease Outcome: Progressing

## 2023-10-21 DIAGNOSIS — K859 Acute pancreatitis without necrosis or infection, unspecified: Secondary | ICD-10-CM | POA: Diagnosis not present

## 2023-10-21 LAB — GLUCOSE, CAPILLARY
Glucose-Capillary: 102 mg/dL — ABNORMAL HIGH (ref 70–99)
Glucose-Capillary: 142 mg/dL — ABNORMAL HIGH (ref 70–99)
Glucose-Capillary: 158 mg/dL — ABNORMAL HIGH (ref 70–99)
Glucose-Capillary: 168 mg/dL — ABNORMAL HIGH (ref 70–99)
Glucose-Capillary: 211 mg/dL — ABNORMAL HIGH (ref 70–99)

## 2023-10-21 MED ORDER — METOCLOPRAMIDE HCL 5 MG PO TABS
10.0000 mg | ORAL_TABLET | Freq: Three times a day (TID) | ORAL | Status: DC
  Administered 2023-10-21 – 2023-10-22 (×4): 10 mg via ORAL
  Filled 2023-10-21 (×4): qty 2

## 2023-10-21 MED ORDER — NAPROXEN 375 MG PO TABS: 375.0000 mg | ORAL_TABLET | Freq: Three times a day (TID) | ORAL | Status: DC | PRN

## 2023-10-21 MED ORDER — PROCHLORPERAZINE MALEATE 10 MG PO TABS: 10.0000 mg | ORAL_TABLET | Freq: Four times a day (QID) | ORAL | Status: DC | PRN

## 2023-10-21 MED ORDER — KETOROLAC TROMETHAMINE 30 MG/ML IJ SOLN: 30.0000 mg | Freq: Four times a day (QID) | INTRAMUSCULAR | Status: DC | PRN

## 2023-10-21 MED ORDER — OXYCODONE HCL 5 MG PO TABS
5.0000 mg | ORAL_TABLET | ORAL | Status: DC | PRN
  Administered 2023-10-21 – 2023-10-22 (×3): 5 mg via ORAL
  Filled 2023-10-21 (×3): qty 1

## 2023-10-21 MED ORDER — PREGABALIN 25 MG PO CAPS
25.0000 mg | ORAL_CAPSULE | Freq: Three times a day (TID) | ORAL | Status: DC
  Administered 2023-10-21 – 2023-10-22 (×3): 25 mg via ORAL
  Filled 2023-10-21 (×3): qty 1

## 2023-10-21 NOTE — Plan of Care (Signed)

## 2023-10-21 NOTE — Progress Notes (Signed)
 [facility] Progress Note Patient: Kendra Parker WUJ:[id number] DOB: 08/22/1970 DOA: 09/27/2023  DOS: the patient was seen and examined on 10/21/2023  Brief Hospital Course: 53 year old with HTN, HLD, chronic migraine headaches comes into the hospital with dull abdominal pain for the past 2 and half weeks, radiating into her back. This has been associated with nausea, vomiting. Given persistent symptoms presented to the ER, and a CT scan revealed acute pancreatitis with no necrosis or fluid collection. Right upper quadrant ultrasound showed fatty liver, and gallbladder was unremarkable. She was admitted to the hospital.   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  Assessment and Plan: Acute pancreatitis likely drug-induced Gallbladder sludge. GI have been consulted. CT scan on admission without complicating factors such as abscesses or necrosis.  There is no gallbladder pathology, no cholelithiasis or choledocholithiasis seen on imaging.  There are some sludge seen. She is following with neurology as an outpatient and is on Ubrelvy , Vyepti, sumatriptan . Migraine meds being held incase they may be related to pt's pancreatitis NG tube placed postpyloric.  Was receiving nighttime tube feeding. Cortrak removed on 5/30.  Diet advanced to heart healthy diet.  Patient was educated extensively with regards to importance of low-fat food as she was seen in the room with milkshakes from cookout on 5/30! Continue pain control although minimize narcotics. Discontinue sumatriptan  as per my discussion with patient on 5/28.   Migraine headaches- on multiple medications at home, follows with neurology as an outpatient.  GI initially recommended to hold off on resuming migraine meds given chance that migraine meds may be related to pancreatitis.  Headache had historically improved with reglan , toradol , and benadryl  Discontinue sumatriptan  as there is increased risk of  pancreatitis. Continue other medications as home regimen.   Hyperlipidemia- continue statin   Hypothyroidism- continue Synthroid    Hypertension- continue metoprolol , blood pressure acceptable, elevated at times due to pain   Obesity, class III- Body mass index is 41.63 kg/m.  Outpatient correlation with PCP recommended.  Chronic pain syndrome Has been on Lyrica . increase the dose to 3 times daily.   Subjective:   No nausea no vomiting.  Able to tolerate oral diet.  Pain still present.  Had a BM.  Physical Exam: General: in Mild distress, No Rash Cardiovascular: S1 and S2 Present, No Murmur Respiratory: Good respiratory effort, Bilateral Air entry present. No Crackles, No wheezes Abdomen: Bowel Sound present, mild diffuse tenderness Extremities: No edema Neuro: Alert and oriented x3, no new focal deficit  Data Reviewed: I have Reviewed nursing notes, Vitals, and Lab results. I have ordered test including CBC and BMP  .   Disposition: Status is: Inpatient Remains inpatient appropriate because: Monitor for improvement in oral intake  Family Communication: Family was at bedside Level of care: Telemetry Medical   Vitals:   10/21/23 0530 10/21/23 0716 10/21/23 0742 10/21/23 1237  BP: 110/70  125/84   Pulse: 84  91   Resp: 18  18   Temp: 98.2 F (36.8 C)  (!) 97.5 F (36.4 C)   TempSrc:   Oral   SpO2: 97%  98%   Weight:  106.6 kg  106.6 kg  Height:         Author: Charlean Congress, MD 10/21/2023 6:52 PM  Please look on [email or web address] to find out who is on call.

## 2023-10-22 DIAGNOSIS — K859 Acute pancreatitis without necrosis or infection, unspecified: Secondary | ICD-10-CM | POA: Diagnosis not present

## 2023-10-22 LAB — BASIC METABOLIC PANEL WITH GFR
Anion gap: 10 (ref 5–15)
BUN: 7 mg/dL (ref 6–20)
CO2: 23 mmol/L (ref 22–32)
Calcium: 9.1 mg/dL (ref 8.9–10.3)
Chloride: 105 mmol/L (ref 98–111)
Creatinine, Ser: 0.48 mg/dL (ref 0.44–1.00)
GFR, Estimated: >60 mL/min (ref >60)
Glucose, Bld: 122 mg/dL — ABNORMAL HIGH (ref 70–99)
Potassium: 3.6 mmol/L (ref 3.5–5.1)
Sodium: 138 mmol/L (ref 135–145)

## 2023-10-22 LAB — CBC
HCT: 32.8 % — ABNORMAL LOW (ref 36.0–46.0)
Hemoglobin: 10.7 g/dL — ABNORMAL LOW (ref 12.0–15.0)
MCH: 30.4 pg (ref 26.0–34.0)
MCHC: 32.6 g/dL (ref 30.0–36.0)
MCV: 93.2 fL (ref 80.0–100.0)
Platelets: 304 10*3/uL (ref 150–400)
RBC: 3.52 MIL/uL — ABNORMAL LOW (ref 3.87–5.11)
RDW: 13.3 % (ref 11.5–15.5)
WBC: 7.7 10*3/uL (ref 4.0–10.5)
nRBC: 0 % (ref 0.0–0.2)

## 2023-10-22 LAB — MAGNESIUM: Magnesium: 1.8 mg/dL (ref 1.7–2.4)

## 2023-10-22 MED ORDER — ONDANSETRON 4 MG PO TBDP
4.0000 mg | ORAL_TABLET | Freq: Three times a day (TID) | ORAL | 0 refills | Status: DC | PRN
Start: 2023-10-22 — End: 2024-02-14

## 2023-10-22 MED ORDER — PANTOPRAZOLE SODIUM 40 MG PO TBEC: 40.0000 mg | DELAYED_RELEASE_TABLET | Freq: Two times a day (BID) | ORAL | 0 refills | Status: AC

## 2023-10-22 MED ORDER — ACETAMINOPHEN 500 MG PO TABS: 500.0000 mg | ORAL_TABLET | Freq: Three times a day (TID) | ORAL | Status: AC

## 2023-10-22 MED ORDER — METOCLOPRAMIDE HCL 10 MG PO TABS: 10.0000 mg | ORAL_TABLET | Freq: Three times a day (TID) | ORAL | 0 refills | Status: DC | PRN

## 2023-10-22 MED ORDER — PREGABALIN 25 MG PO CAPS: 25.0000 mg | ORAL_CAPSULE | Freq: Three times a day (TID) | ORAL | 0 refills | Status: AC

## 2023-10-22 MED ORDER — OXYCODONE HCL 5 MG PO TABS: 5.0000 mg | ORAL_TABLET | Freq: Four times a day (QID) | ORAL | 0 refills | Status: DC | PRN

## 2023-10-22 MED ORDER — POLYETHYLENE GLYCOL 3350 17 G PO PACK: 17.0000 g | PACK | Freq: Every day | ORAL | 0 refills | Status: DC

## 2023-10-22 MED ORDER — NAPROXEN 375 MG PO TABS: 375.0000 mg | ORAL_TABLET | Freq: Three times a day (TID) | ORAL | 0 refills | Status: DC | PRN

## 2023-10-22 NOTE — Discharge Summary (Signed)
 Physician Discharge Summary   Patient: Kendra Parker MRN: [id number] DOB: 04-Feb-1971  Admit date:     09/27/2023  Discharge date: 10/22/2023  Discharge Physician: Charlean Congress  PCP: Joenathan Muslim, FNP  Recommendations at discharge: Follow-up with PCP in 1 week. Follow-up with GI as recommended. Follow-up with general surgery to establish care. Follow-up with neurology to discuss migraine medications.   Follow-up Information     Joenathan Muslim, FNP. Schedule an appointment as soon as possible for a visit in 1 week(s).   Specialty: Family Medicine Why: with BMP lab to look at kidney and electrolytes, with CBC lab to look at blood counts Contact information: [address] [place] [address] [phone]         neurology. Schedule an appointment as soon as possible for a visit in 1 month(s).          Surgery, [facility] [place]. Schedule an appointment as soon as possible for a visit in 3 week(s).   Specialty: General Surgery Why: To Establish Care Contact information: [address] [place] [address] [phone]         [facility]. Schedule an appointment as soon as possible for a visit in 1 month(s).   Specialty: Gastroenterology Contact information: [address] [place]  [address] [phone]               Discharge Diagnoses: Principal Problem:   Pancreatitis Active Problems:   Dyspnea   Constipation   Essential hypertension   Sinus tachycardia   Migraine   Chronic pain   Dyslipidemia   Hypothyroidism   Insomnia   Obesity, Class III, BMI 40-49.9 (morbid obesity)   Epigastric pain   Nausea without vomiting  Hospital Course: 53 year old with HTN, HLD, chronic migraine headaches comes into the hospital with dull abdominal pain for the past 2 and half weeks, radiating into her back. This has been associated with nausea, vomiting. Given persistent symptoms presented  to the ER, and a CT scan revealed acute pancreatitis with no necrosis or fluid collection. Right upper quadrant ultrasound showed fatty liver, and gallbladder was unremarkable. She was admitted to the hospital.   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  Assessment and Plan: Acute pancreatitis likely drug-induced Gallbladder sludge. GI have been consulted. CT scan on admission without complicating factors such as abscesses or necrosis.  There is no gallbladder pathology, no cholelithiasis or choledocholithiasis seen on imaging.  There are some sludge seen. She is following with neurology as an outpatient and is on Ubrelvy , Vyepti, sumatriptan . Migraine meds being held incase they may be related to pt's pancreatitis NG tube placed postpyloric.  Was receiving nighttime tube feeding. Cortrak removed on 5/30.  Diet advanced to heart healthy diet.  Tolerating well. Patient was educated extensively with regards to importance of low-fat food. Continue pain control although minimize narcotics. Discontinue sumatriptan  as per my discussion with patient on 5/28.   Migraine headaches- on multiple medications at home, follows with neurology as an outpatient.  GI initially recommended to hold off on resuming migraine meds given chance that migraine meds may be related to pancreatitis.  Headache had historically improved with reglan , toradol , and benadryl  Discontinue sumatriptan  as there is increased risk of pancreatitis. Continue other medications as home regimen.   Hyperlipidemia- continue statin   Hypothyroidism- continue Synthroid    Hypertension- continue metoprolol , blood pressure acceptable, elevated at times due to pain.  Hold other medication.   Obesity,  class III- Body mass index is 41.63 kg/m.  Outpatient correlation with PCP recommended.  Chronic pain syndrome Has been on Lyrica . increase the dose to 3 times daily.    Pain control - [place]   Controlled Substance Reporting System database was reviewed. and patient was instructed, not to drive, operate heavy machinery, perform activities at heights, swimming or participation in water activities or provide baby-sitting services while on Pain, Sleep and Anxiety Medications; until their outpatient Physician has advised to do so again. Also recommended to not to take more than prescribed Pain, Sleep and Anxiety Medications.  Consultants:  GI  Procedures performed:  NG tube insertion  DISCHARGE MEDICATION: Allergies as of 10/22/2023       Reactions   Lamotrigine Rash   Only at 100 mg is intolerable-gets a rash from head to toe   Topiramate Other (See Comments)   Visual problems   Penicillins Rash        Medication List     PAUSE taking these medications    HYDROcodone -acetaminophen  5-325 MG tablet Wait to take this until: October 26, 2023 While you are on Oxycodone  Commonly known as: NORCO/VICODIN Take 1 tablet by mouth in the morning, at noon, and at bedtime.   rizatriptan  10 MG disintegrating tablet Wait to take this until your doctor or other care provider tells you to start again. Until seen by neurology Commonly known as: MAXALT -MLT Take 10 mg by mouth as needed for migraine. No more than 4 doses of any combination of triptans per week per neurology       STOP taking these medications    fenofibrate  48 MG tablet Commonly known as: TRICOR    lisinopril 10 MG tablet Commonly known as: ZESTRIL   SUMAtriptan  100 MG tablet Commonly known as: IMITREX        TAKE these medications    acetaminophen  500 MG tablet Commonly known as: TYLENOL  Take 1 tablet (500 mg total) by mouth every 8 (eight) hours for 7 days.   albuterol  108 (90 Base) MCG/ACT inhaler Commonly known as: VENTOLIN  HFA inhale 2 puffs into the lungs by mouth every 4 hours as needed for wheezing   atorvastatin  20 MG tablet Commonly known as: LIPITOR Take 20 mg by mouth daily.   celecoxib 200  MG capsule Commonly known as: CELEBREX Take 200 mg by mouth daily.   clonazePAM  0.5 MG tablet Commonly known as: KLONOPIN  Take 1 mg by mouth 2 (two) times daily.   diphenhydramine -acetaminophen  25-500 MG Tabs tablet Commonly known as: TYLENOL  PM Take 2 tablets by mouth at bedtime.   DULoxetine  60 MG capsule Commonly known as: CYMBALTA  Take 60 mg by mouth at bedtime.   DULoxetine  30 MG capsule Commonly known as: CYMBALTA  Take 30 mg by mouth at bedtime.   FIBER PO Take 3 tablets by mouth in the morning and at bedtime.   levETIRAcetam  250 MG tablet Commonly known as: KEPPRA  Take 500 mg by mouth 2 (two) times daily.   levothyroxine  25 MCG tablet Commonly known as: SYNTHROID  Take 1 tablet by mouth daily.   MAGNESIUM PO Take 1 tablet by mouth daily.   methocarbamol  750 MG tablet Commonly known as: ROBAXIN  Take every 8 hours to treat acute migraines. #30 allowed per month. What changed:  how much to take how to take this when to take this additional instructions   metoCLOPramide  10 MG tablet Commonly known as: REGLAN  Take 1 tablet (10 mg total) by mouth every 8 (eight) hours as needed for refractory nausea /  vomiting.   metoprolol  tartrate 50 MG tablet Commonly known as: LOPRESSOR  Take 50 mg by mouth 2 (two) times daily.   multivitamin with minerals tablet Take 1 tablet by mouth daily.   naloxone 4 MG/0.1ML Liqd nasal spray kit Commonly known as: NARCAN Place 0.4 mg into the nose once.   naproxen  375 MG tablet Commonly known as: NAPROSYN  Take 1 tablet (375 mg total) by mouth every 8 (eight) hours as needed for headache.   Nurtec 75 MG Tbdp Generic drug: Rimegepant Sulfate  Take 75 mg by mouth every other day.   ondansetron  4 MG disintegrating tablet Commonly known as: ZOFRAN -ODT Take 1 tablet (4 mg total) by mouth every 8 (eight) hours as needed for nausea or vomiting. What changed: reasons to take this   oxyCODONE  5 MG immediate release tablet Commonly  known as: Oxy IR/ROXICODONE  Take 1 tablet (5 mg total) by mouth every 6 (six) hours as needed for severe pain (pain score 7-10) or moderate pain (pain score 4-6).   pantoprazole  40 MG tablet Commonly known as: PROTONIX  Take 1 tablet (40 mg total) by mouth 2 (two) times daily before a meal.   polyethylene glycol 17 g packet Commonly known as: MIRALAX  / GLYCOLAX  Take 17 g by mouth daily. Start taking on: October 23, 2023   pregabalin  25 MG capsule Commonly known as: LYRICA  Take 1 capsule (25 mg total) by mouth 3 (three) times daily. What changed: when to take this   QUEtiapine  200 MG tablet Commonly known as: SEROQUEL  Take 200 mg by mouth at bedtime.   spironolactone 25 MG tablet Commonly known as: ALDACTONE Take 25 mg by mouth daily.   Ubrelvy  100 MG Tabs Generic drug: Ubrogepant  Take 100 mg by mouth as needed (migraines). Max of 2 doses per week (per neurology)   venlafaxine  XR 37.5 MG 24 hr capsule Commonly known as: Effexor  XR Take 1 capsule (37.5 mg total) by mouth daily with breakfast. What changed: how much to take   VITAMIN B-12 PO Take 1 capsule by mouth daily.   Vitamin D3 50 MCG (2000 UT) Tabs Take 2,000 Units by mouth daily.   VYEPTI IV Inject 300 mg into the vein every 3 (three) months. VYEPTI       Disposition: Home Diet recommendation: Cardiac diet  Discharge Exam: Vitals:   10/21/23 1237 10/21/23 2048 10/22/23 0628 10/22/23 0807  BP:  (!) 148/76 124/66 104/72  Pulse:  97 84 87  Resp:  17 17 19   Temp:  98 F (36.7 C)  98.2 F (36.8 C)  TempSrc:  Oral    SpO2:  98% 94% 96%  Weight: 106.6 kg     Height:       General: Appear in mild distress; no visible Abnormal Neck Mass Or lumps, Conjunctiva normal Cardiovascular: S1 and S2 Present, no Murmur, Respiratory: good respiratory effort, Bilateral Air entry present and CTA, no Crackles, no wheezes Abdomen: Bowel Sound present, mild diffuse tenderness Extremities: no Pedal edema Neurology: alert  and oriented to time, place, and person  [facility] Weights   10/20/23 0409 10/21/23 0716 10/21/23 1237  Weight: 112.4 kg 106.6 kg 106.6 kg   Condition at discharge: stable  The results of significant diagnostics from this hospitalization (including imaging, microbiology, ancillary and laboratory) are listed below for reference.   Imaging Studies: CT ABDOMEN PELVIS W CONTRAST Result Date: 10/16/2023 CLINICAL DATA:  Acute nonlocalized abdominal pain EXAM: CT ABDOMEN AND PELVIS WITH CONTRAST TECHNIQUE: Multidetector CT imaging of the abdomen and pelvis  was performed using the standard protocol following bolus administration of intravenous contrast. RADIATION DOSE REDUCTION: This exam was performed according to the departmental dose-optimization program which includes automated exposure control, adjustment of the mA and/or kV according to patient size and/or use of iterative reconstruction technique. CONTRAST:  75mL OMNIPAQUE  IOHEXOL  350 MG/ML SOLN COMPARISON:  Abdominal radiograph 10/15/2023; CT abdomen pelvis 10/12/2023 FINDINGS: Lower chest: No acute abnormality. Hepatobiliary: Decompressed gallbladder. Unremarkable liver and biliary tree. Pancreas: Redemonstrated findings of acute pancreatitis. Similar to slightly decreased adjacent stranding and loosely organized fluid. The most defined collection along the inferior pancreatic tail extending inferiorly in the anterior pararenal space. This measures 2.9 x 1.7 x 8.6 cm, previously 2.9 x 2.0 x 8.6 cm. No evidence of pancreatic necrosis. No ductal dilation. Spleen: Unremarkable. Adrenals/Urinary Tract: Normal adrenal glands. No urinary calculi or hydronephrosis. Unremarkable bladder. Stomach/Bowel: Normal caliber large and small bowel. Enteric tube tip in the duodenum. No bowel wall thickening. Vascular/Lymphatic: There is narrowing of the SMV, splenic vein, and portal vein at the confluence. No evidence of thrombus. Aortic atherosclerosis. No enlarged abdominal  or pelvic lymph nodes. Reproductive: Hysterectomy.  No adnexal mass. Other: No free intraperitoneal air. Musculoskeletal: No acute fracture. IMPRESSION: 1. Redemonstrated findings of acute pancreatitis. Similar to slightly decreased adjacent stranding and loosely organized fluid collections. 2. Narrowing of the SMV, splenic vein, and portal vein at the confluence due to pancreatitis. No evidence of thrombus. 3. Aortic Atherosclerosis (ICD10-I70.0). Electronically Signed   By: Rozell Cornet M.D.   On: 10/16/2023 03:35   DG ABD ACUTE 2+V W 1V CHEST Result Date: 10/15/2023 CLINICAL DATA:  Follow-up severe, acute pancreatitis with a clinical concern for developing small bowel obstruction. EXAM: DG ABDOMEN ACUTE WITH 1 VIEW CHEST COMPARISON:  Portable chest dated 10/02/2023 and abdomen and pelvis CT dated 10/12/2023. FINDINGS: Normal sized heart with a marked decrease in size. Clear lungs with normal vascularity. Feeding tube extending into the stomach. Unremarkable bones. The feeding tube tip is in the duodenal bulb. Air-fluid level in the stomach. The stomach is distended without abnormal dilatation. Normal bowel gas pattern. No free peritoneal air. Thoracic spine degenerative changes. IMPRESSION: 1. No acute abnormality. 2. Feeding tube tip in the duodenal bulb. 3. No evidence of bowel obstruction. Electronically Signed   By: Catherin Closs M.D.   On: 10/15/2023 16:47   CT ABDOMEN PELVIS W CONTRAST Result Date: 10/12/2023 CLINICAL DATA:  Abdominal pain and history of pancreatitis EXAM: CT ABDOMEN AND PELVIS WITH CONTRAST TECHNIQUE: Multidetector CT imaging of the abdomen and pelvis was performed using the standard protocol following bolus administration of intravenous contrast. RADIATION DOSE REDUCTION: This exam was performed according to the departmental dose-optimization program which includes automated exposure control, adjustment of the mA and/or kV according to patient size and/or use of iterative  reconstruction technique. CONTRAST:  75mL OMNIPAQUE  IOHEXOL  350 MG/ML SOLN COMPARISON:  10/05/2023 FINDINGS: Lower chest: No acute abnormality. Hepatobiliary: Liver is within normal limits. Gallbladder is partially distended with dependent gallbladder sludge stable from the previous exam. Pancreas: Pancreas demonstrates a normal enhancement pattern although an increase in the degree of peripancreatic fluid is noted better delineated following contrast administration. Developing fluid collections are noted along the course of the pancreas anteriorly and in the gastrosplenic ligament region. The most defined collection measures up to 2.9 cm in transverse dimension and extends for approximately 8 cm in craniocaudad projection. This lies inferiorly extending along the anterior aspect of Gerota's fascia on the left. Spleen: Normal in size without  focal abnormality. Adrenals/Urinary Tract: Adrenal glands are within normal limits. Kidneys demonstrate a normal enhancement pattern bilaterally. Normal excretion is noted on delayed images. No obstructive changes are seen. The bladder is within normal limits. Stomach/Bowel: No obstructive or inflammatory changes of the colon are seen. The appendix is within normal limits. Small bowel and stomach are unremarkable. Feeding catheter extends into the second portion of the duodenum. Vascular/Lymphatic: Aortic atherosclerosis. No enlarged abdominal or pelvic lymph nodes. Reproductive: Status post hysterectomy. No adnexal masses. Other: No abdominal wall hernia or abnormality. No abdominopelvic ascites. Musculoskeletal: No acute or significant osseous findings. IMPRESSION: Changes consistent with acute pancreatitis with increase in the degree of peripancreatic fluid. Some developing collections are seen most prominently posterior to the pancreas extending inferiorly along Gerota's fascia on the left. Gallbladder sludge without complicating factors. Electronically Signed   By: Violeta Grey M.D.   On: 10/12/2023 21:11   DG Abd 1 View Result Date: 10/10/2023 CLINICAL DATA:  Feeding tube placement. EXAM: ABDOMEN - 1 VIEW COMPARISON:  None Available. FINDINGS: Single intraoperative fluoroscopic spot image provided. The total fluoroscopic time is 1 minutes 54 seconds with a cumulative air Karma of 18.2 mGy. Feeding tube in the region of the duodenum. IMPRESSION: Feeding tube in the region of the duodenum. Electronically Signed   By: Angus Bark M.D.   On: 10/10/2023 09:42   DG Naso/Oro Veleta Gerold Thru Duo-Reposition Result Date: 10/07/2023 CLINICAL DATA:  Feeding tube placement. EXAM: ABDOMEN - 1 VIEW; IR NASO/ORO GTUBE THRU DUO - REPOSITION COMPARISON:  None Available. FINDINGS: Intermittent fluoroscopy was used to assist with feeding tube placement. Feeding tube tip is transpyloric, in the distal duodenum near the ligament of Treitz. Tip position was confirmed by injection of a small amount of enteric contrast. IMPRESSION: Feeding tube tip is transpyloric, in the distal duodenum near the ligament of Treitz. Feeding tube is ready for use. Electronically Signed   By: Donnal Fusi M.D.   On: 10/07/2023 09:00   DG Abd 1 View Result Date: 10/07/2023 CLINICAL DATA:  Feeding tube placement. EXAM: ABDOMEN - 1 VIEW; IR NASO/ORO GTUBE THRU DUO - REPOSITION COMPARISON:  None Available. FINDINGS: Intermittent fluoroscopy was used to assist with feeding tube placement. Feeding tube tip is transpyloric, in the distal duodenum near the ligament of Treitz. Tip position was confirmed by injection of a small amount of enteric contrast. IMPRESSION: Feeding tube tip is transpyloric, in the distal duodenum near the ligament of Treitz. Feeding tube is ready for use. Electronically Signed   By: Donnal Fusi M.D.   On: 10/07/2023 09:00   CT PANCREAS ABD W/WO Result Date: 10/05/2023 CLINICAL DATA:  Prolonged course of acute, severe pancreatitis. Clinical concern for complications. EXAM: CT ABDOMEN WITHOUT  AND WITH CONTRAST TECHNIQUE: Multidetector CT imaging of the abdomen was performed following the standard protocol before and following the bolus administration of intravenous contrast. RADIATION DOSE REDUCTION: This exam was performed according to the departmental dose-optimization program which includes automated exposure control, adjustment of the mA and/or kV according to patient size and/or use of iterative reconstruction technique. CONTRAST:  OMNIPAQUE  IOHEXOL  350 MG/ML SOLN COMPARISON:  09/26/2023 and abdomen MR with MRCP dated 09/30/2023. FINDINGS: Lower chest: Normal sized heart. Minimal bibasilar atelectasis. Minimal left pleural effusion. Hepatobiliary: Heterogeneous hepatic steatosis. Interval sludge in the dependent portion of the gallbladder. No gallbladder wall thickening or pericholecystic fluid. Pancreas: Better defined pancreatic margins. Mild increase in peripancreatic fluid with no walled-off fluid collections to indicate pseudocyst or abscess  formation at this time. Spleen: Normal in size without focal abnormality. Adrenals/Urinary Tract: Unremarkable adrenal glands, kidneys and proximal ureters. Stomach/Bowel: Feeding tube extending through the stomach and into the proximal duodenum and folded back upon itself with its tip in the distal stomach. Normal caliber small bowel and colon. Low-density wall thickening involving the proximal small bowel in the right upper quadrant of the abdomen in the area peripancreatic edematous changes. Vascular/Lymphatic: Mild atheromatous aortic calcifications. No enlarged lymph nodes. Other: None. Musculoskeletal: Mild lumbar and lower thoracic spine degenerative changes. IMPRESSION: 1. Mild increase in peripancreatic fluid with no walled-off fluid collections to indicate pseudocyst or abscess formation at this time. 2. Low-density wall thickening involving the proximal small bowel in the right upper quadrant of the abdomen in the area of peripancreatic  edematous changes, consistent with reactive inflammatory changes. 3. Heterogeneous hepatic steatosis. 4. Interval sludge in the dependent portion of the gallbladder. 5. Minimal left pleural effusion. 6. Minimal bibasilar atelectasis. 7. Aortic atherosclerosis. Aortic Atherosclerosis (ICD10-I70.0). Electronically Signed   By: Catherin Closs M.D.   On: 10/05/2023 14:11   DG Abd Portable 1V Result Date: 10/03/2023 CLINICAL DATA:  Nasogastric tube placement. EXAM: PORTABLE ABDOMEN - 1 VIEW COMPARISON:  None Available. FINDINGS: Tip of the weighted enteric tube in the right upper quadrant in the region of the distal stomach or proximal duodenum. No bowel dilatation in the included upper abdomen. IMPRESSION: Tip of the weighted enteric tube in the right upper quadrant in the region of the distal stomach or proximal duodenum. Electronically Signed   By: Chadwick Colonel M.D.   On: 10/03/2023 11:48   DG Chest Port 1 View Result Date: 10/02/2023 CLINICAL DATA:  Dyspnea. EXAM: PORTABLE CHEST 1 VIEW COMPARISON:  Radiograph yesterday FINDINGS: The heart is prominent in size. Increasing opacity at the right lung base. Left lung base atelectasis is stable. Improving peribronchial thickening. Question developing right pleural effusion. No pneumothorax. IMPRESSION: 1. Increasing opacity at the right lung base, may represent atelectasis or pneumonia. Question developing right pleural effusion. 2. Improving peribronchial thickening. Electronically Signed   By: Chadwick Colonel M.D.   On: 10/02/2023 16:29   DG Chest 2 View Result Date: 10/01/2023 CLINICAL DATA:  Shortness of breath. EXAM: CHEST - 2 VIEW COMPARISON:  09/27/2023, CT 09/26/2023 FINDINGS: The heart is normal in size with stable mediastinal contours. Scattered atelectasis, greatest at the lung bases. There is mild peribronchial thickening. No confluent consolidation. No significant pleural effusion. No pneumothorax. IMPRESSION: Mild peribronchial thickening and  scattered atelectasis. Electronically Signed   By: Chadwick Colonel M.D.   On: 10/01/2023 11:00   MR ABDOMEN MRCP W WO CONTAST Result Date: 09/30/2023 CLINICAL DATA:  Pancreatitis. EXAM: MRI ABDOMEN WITHOUT AND WITH CONTRAST (INCLUDING MRCP) TECHNIQUE: Multiplanar multisequence MR imaging of the abdomen was performed both before and after the administration of intravenous contrast. Heavily T2-weighted images of the biliary and pancreatic ducts were obtained, and three-dimensional MRCP images were rendered by post processing. CONTRAST:  10mL GADAVIST  GADOBUTROL  1 MMOL/ML IV SOLN COMPARISON:  09/26/2023 abdominal ultrasound.  CT 09/26/2023. FINDINGS: Moderate motion degradation throughout. Lower chest: Mild cardiomegaly. Hepatobiliary: Hepatomegaly at 21.3 cm craniocaudal. Probable mild hepatic steatosis. No gallstones. No intra or extrahepatic biliary duct dilatation. No choledocholithiasis. Pancreas: Markedly increased, moderate peripancreatic edema. Areas of relatively ill-defined fluid signal are seen caudal to the pancreatic neck at 3.2 x 2.7 cm on 29/6 and caudal to the pancreatic body/tail junction at 2.8 x 1.2 cm on 32/6. Given motion, no evidence  of pancreatic necrosis. No duct dilatation. Spleen:  Normal in size, without focal abnormality. Adrenals/Urinary Tract: Normal adrenal glands. Interpolar left renal subcentimeter lesion is likely a cyst . In the absence of clinically indicated signs/symptoms require(s) no independent follow-up. Normal right kidney. No hydronephrosis. Stomach/Bowel: Colonic stool burden suggests constipation. Grossly normal stomach and abdominal small bowel. Vascular/Lymphatic: Aortic atherosclerosis. Patent portal vein. Splenic vein poorly evaluated. Felt to be patent in the splenic hilum on 49/21. No gross abdominal adenopathy. Other:  Mesenteric edema. Musculoskeletal: No acute osseous abnormality. IMPRESSION: 1. Moderately motion degraded exam. 2. Significant progression of  moderate acute interstitial pancreatitis. No convincing evidence of pancreatic necrosis. Relatively ill-defined fluid signal in the peripancreatic space, as delineated above. Depending on clinical symptomatology, consider follow-up with cross-sectional imaging at 5-7 days to exclude developing pseudocyst. Given limitations of the current MRI, contrast enhanced CT likely of higher yield. 3. No choledocholithiasis or biliary duct dilatation. 4. Hepatomegaly and probable hepatic steatosis. 5.  Possible constipation. Electronically Signed   By: Lore Rode M.D.   On: 09/30/2023 14:12   MR 3D Recon At Scanner Result Date: 09/30/2023 CLINICAL DATA:  Pancreatitis. EXAM: MRI ABDOMEN WITHOUT AND WITH CONTRAST (INCLUDING MRCP) TECHNIQUE: Multiplanar multisequence MR imaging of the abdomen was performed both before and after the administration of intravenous contrast. Heavily T2-weighted images of the biliary and pancreatic ducts were obtained, and three-dimensional MRCP images were rendered by post processing. CONTRAST:  10mL GADAVIST  GADOBUTROL  1 MMOL/ML IV SOLN COMPARISON:  09/26/2023 abdominal ultrasound.  CT 09/26/2023. FINDINGS: Moderate motion degradation throughout. Lower chest: Mild cardiomegaly. Hepatobiliary: Hepatomegaly at 21.3 cm craniocaudal. Probable mild hepatic steatosis. No gallstones. No intra or extrahepatic biliary duct dilatation. No choledocholithiasis. Pancreas: Markedly increased, moderate peripancreatic edema. Areas of relatively ill-defined fluid signal are seen caudal to the pancreatic neck at 3.2 x 2.7 cm on 29/6 and caudal to the pancreatic body/tail junction at 2.8 x 1.2 cm on 32/6. Given motion, no evidence of pancreatic necrosis. No duct dilatation. Spleen:  Normal in size, without focal abnormality. Adrenals/Urinary Tract: Normal adrenal glands. Interpolar left renal subcentimeter lesion is likely a cyst . In the absence of clinically indicated signs/symptoms require(s) no independent  follow-up. Normal right kidney. No hydronephrosis. Stomach/Bowel: Colonic stool burden suggests constipation. Grossly normal stomach and abdominal small bowel. Vascular/Lymphatic: Aortic atherosclerosis. Patent portal vein. Splenic vein poorly evaluated. Felt to be patent in the splenic hilum on 49/21. No gross abdominal adenopathy. Other:  Mesenteric edema. Musculoskeletal: No acute osseous abnormality. IMPRESSION: 1. Moderately motion degraded exam. 2. Significant progression of moderate acute interstitial pancreatitis. No convincing evidence of pancreatic necrosis. Relatively ill-defined fluid signal in the peripancreatic space, as delineated above. Depending on clinical symptomatology, consider follow-up with cross-sectional imaging at 5-7 days to exclude developing pseudocyst. Given limitations of the current MRI, contrast enhanced CT likely of higher yield. 3. No choledocholithiasis or biliary duct dilatation. 4. Hepatomegaly and probable hepatic steatosis. 5.  Possible constipation. Electronically Signed   By: Lore Rode M.D.   On: 09/30/2023 14:12   DG CHEST PORT 1 VIEW Result Date: 09/27/2023 CLINICAL DATA:  Shortness of breath. EXAM: PORTABLE CHEST 1 VIEW COMPARISON:  July 22, 2011. FINDINGS: The heart size and mediastinal contours are within normal limits. Left lung is clear. Minimal right basilar subsegmental atelectasis is noted. The visualized skeletal structures are unremarkable. IMPRESSION: Minimal right basilar subsegmental atelectasis. Electronically Signed   By: Rosalene Colon M.D.   On: 09/27/2023 16:24   US  Abdomen Limited RUQ (  LIVER/GB) Result Date: 09/26/2023 CLINICAL DATA:  [id number] Pancreatitis [id number] Transaminitis [id number] EXAM: ULTRASOUND ABDOMEN LIMITED RIGHT UPPER QUADRANT COMPARISON:  None Available. FINDINGS: Gallbladder: No gallstones, pericholecystic fluid or wall thickening visualized. Common bile duct: Diameter: 0.4 cm. Liver: Liver is hyperechoic consistent with  fatty infiltration. No focal hepatic lesions identified. No intrahepatic ductal dilatation. Hepatopetal portal vein flow. IMPRESSION: Hepatic fatty infiltration. Otherwise unremarkable examination of the liver and gallbladder. Electronically Signed   By: Sydell Eva M.D.   On: 09/26/2023 21:06   CT ABDOMEN PELVIS W CONTRAST Result Date: 09/26/2023 CLINICAL DATA:  Tachycardia, shortness of breath, abdominal pain, nausea, vomiting, and diarrhea EXAM: CT ANGIOGRAPHY CHEST CT ABDOMEN AND PELVIS WITH CONTRAST TECHNIQUE: Multidetector CT imaging of the chest was performed using the standard protocol during bolus administration of intravenous contrast. Multiplanar CT image reconstructions and MIPs were obtained to evaluate the vascular anatomy. Multidetector CT imaging of the abdomen and pelvis was performed using the standard protocol during bolus administration of intravenous contrast. RADIATION DOSE REDUCTION: This exam was performed according to the departmental dose-optimization program which includes automated exposure control, adjustment of the mA and/or kV according to patient size and/or use of iterative reconstruction technique. CONTRAST:  OMNIPAQUE  IOHEXOL  350 MG/ML SOLN COMPARISON:  CTA chest dated 04/08/2023, CT abdomen and pelvis dated 11/28/2020 FINDINGS: CTA CHEST FINDINGS Cardiovascular: The study is adequate for the evaluation of pulmonary embolism to the level of the distal segmental pulmonary arteries in the setting of motion artifact and somewhat suboptimal contrast bolus timing. No definite filling defect to the level of the distal segmental pulmonary arteries. Apparent focal expansion and hypodensity of the left lower lobar posterior segmental artery (5:125) without correlate on subsequent portal venous phase image (11:3), likely artifactual. Great vessels are normal in course and caliber. Normal heart size. No significant pericardial fluid/thickening. Mediastinum/Nodes: Imaged thyroid  gland without nodules meeting criteria for imaging follow-up by size. Normal esophagus. No pathologically enlarged axillary, supraclavicular, mediastinal, or hilar lymph nodes. Lungs/Pleura: The central airways are patent. Diffuse mosaic attenuation. Bibasilar linear subsegmental atelectasis. No pneumothorax. No pleural effusion. Musculoskeletal: No acute or abnormal lytic or blastic osseous lesions. Multilevel degenerative changes of the thoracic spine. Review of the MIP images confirms the above findings. CT ABDOMEN and PELVIS FINDINGS Hepatobiliary: No focal hepatic lesions. No intra or extrahepatic biliary ductal dilation. Normal gallbladder. Pancreas: Diffusely edematous appearance of the pancreas with surrounding peripancreatic stranding and free fluid. No main pancreatic ductal dilation. Spleen: Normal in size without focal abnormality. Adrenals/Urinary Tract: No adrenal nodules. No suspicious renal mass, calculi, or hydronephrosis. Mild mural thickening of the right mid ureter, likely reactive. No focal bladder wall thickening. Stomach/Bowel: Normal appearance of the stomach. No evidence of bowel wall thickening, distention, or inflammatory changes. Moderate volume mildly hyperattenuating, desiccated stool throughout the colon. Normal appendix. Vascular/Lymphatic: Aortic atherosclerosis. No enlarged abdominal or pelvic lymph nodes. Reproductive: No adnexal masses. Other: Small volume free fluid extending from the level of pancreas along the anterior renal fascia into the pelvis. No free air or fluid collection. Mildly nodular appearance of the omentum, likely related to mesenteric edema. Musculoskeletal: No acute or abnormal lytic or blastic osseous lesions. Mild subcutaneous soft tissue edema along the right anterior abdomen. IMPRESSION: 1. No definite pulmonary embolism to the level of the distal segmental pulmonary arteries in the setting of motion artifact and somewhat suboptimal contrast bolus timing.  2. Acute interstitial edematous pancreatitis. No evidence of pancreatic necrosis or fluid collection. 3. Diffuse mosaic attenuation,  which can be seen in the setting of small airways disease. 4. Moderate volume mildly hyperattenuating, desiccated stool throughout the colon, which can be seen in the setting of constipation. 5.  Aortic Atherosclerosis (ICD10-I70.0). Electronically Signed   By: Limin  Xu M.D.   On: 09/26/2023 19:30   CT Angio Chest PE W and/or Wo Contrast Result Date: 09/26/2023 CLINICAL DATA:  Tachycardia, shortness of breath, abdominal pain, nausea, vomiting, and diarrhea EXAM: CT ANGIOGRAPHY CHEST CT ABDOMEN AND PELVIS WITH CONTRAST TECHNIQUE: Multidetector CT imaging of the chest was performed using the standard protocol during bolus administration of intravenous contrast. Multiplanar CT image reconstructions and MIPs were obtained to evaluate the vascular anatomy. Multidetector CT imaging of the abdomen and pelvis was performed using the standard protocol during bolus administration of intravenous contrast. RADIATION DOSE REDUCTION: This exam was performed according to the departmental dose-optimization program which includes automated exposure control, adjustment of the mA and/or kV according to patient size and/or use of iterative reconstruction technique. CONTRAST:  OMNIPAQUE  IOHEXOL  350 MG/ML SOLN COMPARISON:  CTA chest dated 04/08/2023, CT abdomen and pelvis dated 11/28/2020 FINDINGS: CTA CHEST FINDINGS Cardiovascular: The study is adequate for the evaluation of pulmonary embolism to the level of the distal segmental pulmonary arteries in the setting of motion artifact and somewhat suboptimal contrast bolus timing. No definite filling defect to the level of the distal segmental pulmonary arteries. Apparent focal expansion and hypodensity of the left lower lobar posterior segmental artery (5:125) without correlate on subsequent portal venous phase image (11:3), likely artifactual.  Great vessels are normal in course and caliber. Normal heart size. No significant pericardial fluid/thickening. Mediastinum/Nodes: Imaged thyroid gland without nodules meeting criteria for imaging follow-up by size. Normal esophagus. No pathologically enlarged axillary, supraclavicular, mediastinal, or hilar lymph nodes. Lungs/Pleura: The central airways are patent. Diffuse mosaic attenuation. Bibasilar linear subsegmental atelectasis. No pneumothorax. No pleural effusion. Musculoskeletal: No acute or abnormal lytic or blastic osseous lesions. Multilevel degenerative changes of the thoracic spine. Review of the MIP images confirms the above findings. CT ABDOMEN and PELVIS FINDINGS Hepatobiliary: No focal hepatic lesions. No intra or extrahepatic biliary ductal dilation. Normal gallbladder. Pancreas: Diffusely edematous appearance of the pancreas with surrounding peripancreatic stranding and free fluid. No main pancreatic ductal dilation. Spleen: Normal in size without focal abnormality. Adrenals/Urinary Tract: No adrenal nodules. No suspicious renal mass, calculi, or hydronephrosis. Mild mural thickening of the right mid ureter, likely reactive. No focal bladder wall thickening. Stomach/Bowel: Normal appearance of the stomach. No evidence of bowel wall thickening, distention, or inflammatory changes. Moderate volume mildly hyperattenuating, desiccated stool throughout the colon. Normal appendix. Vascular/Lymphatic: Aortic atherosclerosis. No enlarged abdominal or pelvic lymph nodes. Reproductive: No adnexal masses. Other: Small volume free fluid extending from the level of pancreas along the anterior renal fascia into the pelvis. No free air or fluid collection. Mildly nodular appearance of the omentum, likely related to mesenteric edema. Musculoskeletal: No acute or abnormal lytic or blastic osseous lesions. Mild subcutaneous soft tissue edema along the right anterior abdomen. IMPRESSION: 1. No definite pulmonary  embolism to the level of the distal segmental pulmonary arteries in the setting of motion artifact and somewhat suboptimal contrast bolus timing. 2. Acute interstitial edematous pancreatitis. No evidence of pancreatic necrosis or fluid collection. 3. Diffuse mosaic attenuation, which can be seen in the setting of small airways disease. 4. Moderate volume mildly hyperattenuating, desiccated stool throughout the colon, which can be seen in the setting of constipation. 5.  Aortic Atherosclerosis (ICD10-I70.0). Electronically  Signed   By: Limin  Xu M.D.   On: 09/26/2023 19:30    Microbiology: Results for orders placed or performed during the hospital encounter of 07/22/11  MRSA PCR Screening     Status: None   Collection Time: 07/23/11  3:06 AM   Specimen: Nasal Mucosa; Nasopharyngeal  Result Value Ref Range Status   MRSA by PCR NEGATIVE NEGATIVE Final    Comment:        The GeneXpert MRSA Assay (FDA approved for NASAL specimens only), is one component of a comprehensive MRSA colonization surveillance program. It is not intended to diagnose MRSA infection nor to guide or monitor treatment for MRSA infections.   Labs: CBC: Recent Labs  Lab 10/16/23 0553 10/20/23 0607 10/22/23 0312  WBC 7.8 7.2 7.7  NEUTROABS 4.0 3.8  --   HGB 10.7* 10.8* 10.7*  HCT 33.4* 33.7* 32.8*  MCV 95.7 94.4 93.2  PLT 423* 359 304   Basic Metabolic Panel: Recent Labs  Lab 10/16/23 0553 10/20/23 0607 10/22/23 0312  NA 139 139 138  K 3.7 3.9 3.6  CL 103 107 105  CO2 27 25 23   GLUCOSE 150* 143* 122*  BUN 10 9 7   CREATININE 0.58 0.54 0.48  CALCIUM  9.2 9.1 9.1  MG  --  1.8 1.8   Liver Function Tests: Recent Labs  Lab 10/16/23 0553 10/20/23 0607  AST 21 37  ALT 21 35  ALKPHOS 83 85  BILITOT 0.4 0.3  PROT 6.5 6.4*  ALBUMIN 3.0* 3.1*   CBG: Recent Labs  Lab 10/21/23 0021 10/21/23 0531 10/21/23 0745 10/21/23 1312 10/21/23 1715  GLUCAP 168* 142* 211* 158* 102*    Discharge time spent:  greater than 30 minutes.  Author: Charlean Congress, MD  [facility] 10/22/2023

## 2023-10-22 NOTE — Plan of Care (Signed)

## 2023-10-22 NOTE — Progress Notes (Signed)
 DISCHARGE NOTE HOME JOSHLYNN ALFONZO to be discharged Home per MD order. Discussed prescriptions and follow up appointments with the patient. Prescriptions given to patient; medication list explained in detail. Patient verbalized understanding.  Skin clean, dry and intact without evidence of skin break down, no evidence of skin tears noted. IV catheter discontinued intact. Site without signs and symptoms of complications. Dressing and pressure applied. Pt denies pain at the site currently. No complaints noted.  Patient free of lines, drains, and wounds.   An After Visit Summary (AVS) was printed and given to the patient. Patient escorted via wheelchair, and discharged home via private auto.  Elvina Hammers, RN

## 2023-10-25 LAB — IGG 4: IgG, Subclass 4: 8 mg/dL (ref 2–96)

## 2023-10-26 ENCOUNTER — Encounter: Payer: Self-pay | Admitting: Physician Assistant

## 2023-11-29 ENCOUNTER — Ambulatory Visit: Admitting: Physician Assistant

## 2023-11-29 ENCOUNTER — Other Ambulatory Visit: Payer: Self-pay | Admitting: Surgery

## 2023-11-29 DIAGNOSIS — K851 Biliary acute pancreatitis without necrosis or infection: Secondary | ICD-10-CM

## 2023-12-01 ENCOUNTER — Ambulatory Visit
Admission: RE | Admit: 2023-12-01 | Discharge: 2023-12-01 | Disposition: A | Discharge status: Home / Self Care | Source: Ambulatory Visit | Attending: Surgery | Admitting: Surgery

## 2023-12-01 DIAGNOSIS — K851 Biliary acute pancreatitis without necrosis or infection: Secondary | ICD-10-CM

## 2023-12-01 MED ORDER — IOPAMIDOL (ISOVUE-300) INJECTION 61%
100.0000 mL | Freq: Once | INTRAVENOUS | Status: AC | PRN
  Administered 2023-12-01: 100 mL via INTRAVENOUS

## 2024-02-01 ENCOUNTER — Other Ambulatory Visit: Payer: Self-pay | Admitting: Neurosurgery

## 2024-02-10 ENCOUNTER — Ambulatory Visit: Payer: Self-pay | Admitting: Surgery

## 2024-02-10 NOTE — Progress Notes (Signed)
 Sent message, via epic in basket, requesting orders in epic from Careers adviser.

## 2024-02-16 NOTE — Progress Notes (Signed)
 COVID Vaccine Completed:  Date of COVID positive in last 90 days:  PCP - Elberta Cone, FNP Cardiologist - saw in 2024, did not follow up Reed Allan, MD Neurologist- Dr. Scot  Chest x-ray - 02/16/24 [facility] EKG - 02/17/24 Epic/Chart Stress Test - N/A ECHO - N/A Cardiac Cath - n/a Pacemaker/ICD device last checked:N/A Spinal Cord Stimulator:N/A  Bowel Prep - N/A  Sleep Study - N/A CPAP -   Fasting Blood Sugar - N/A Checks Blood Sugar _____ times a day  Last dose of GLP1 agonist-  N/A GLP1 instructions:  Do not take after     Last dose of SGLT-2 inhibitors-  N/A SGLT-2 instructions:  Do not take after     Blood Thinner Instructions: N/A Last dose:   Time: Aspirin Instructions:N/A Last Dose:  Activity level: Can perform activities of daily living without stopping and without symptoms of chest pain. Difficulty with stairs, avoids if needed. States that if she walks for about 5 minutes she needs to stop due to becoming really hot and sweating and SOB. She states that she was told this is a side effect of her medications. Patient reports smoking her vape all the time.  Anesthesia review: heart murmur, SOB  Patient denies shortness of breath, fever, cough and chest pain at PAT appointment  Patient verbalized understanding of instructions that were given to them at the PAT appointment. Patient was also instructed that they will need to review over the PAT instructions again at home before surgery.

## 2024-02-16 NOTE — Patient Instructions (Signed)
 SURGICAL WAITING ROOM VISITATION  Patients having surgery or a procedure may have no more than 2 support people in the waiting area - these visitors may rotate.    Children under the age of 10 must have an adult with them who is not the patient.  Visitors with respiratory illnesses are discouraged from visiting and should remain at home.  If the patient needs to stay at the hospital during part of their recovery, the visitor guidelines for inpatient rooms apply. Pre-op nurse will coordinate an appropriate time for 1 support person to accompany patient in pre-op.  This support person may not rotate.    Please refer to the [facility] website for the visitor guidelines for Inpatients (after your surgery is over and you are in a regular room).    Your procedure is scheduled on: 02/20/24   Report to [facility] Main Entrance    Report to admitting at 10:05 AM   Call this number if you have problems the morning of surgery [phone]   Do not eat food :After Midnight.   After Midnight you may have the following liquids until 9:20 AM DAY OF SURGERY  Water Non-Citrus Juices (without pulp, NO RED-Apple, White grape, White cranberry) Black Coffee (NO MILK/CREAM OR CREAMERS, sugar ok)  Clear Tea (NO MILK/CREAM OR CREAMERS, sugar ok) regular and decaf                             Plain Jell-O (NO RED)                                           Fruit ices (not with fruit pulp, NO RED)                                     Popsicles (NO RED)                                                               Sports drinks like Gatorade (NO RED)                      If you have questions, please contact your surgeon's office.   FOLLOW BOWEL PREP AND ANY ADDITIONAL PRE OP INSTRUCTIONS YOU RECEIVED FROM YOUR SURGEON'S OFFICE!!!     Oral Hygiene is also important to reduce your risk of infection.                                    Remember - BRUSH YOUR TEETH THE MORNING OF SURGERY WITH YOUR  REGULAR TOOTHPASTE  DENTURES WILL BE REMOVED PRIOR TO SURGERY PLEASE DO NOT APPLY Poly grip OR ADHESIVES!!!   Do NOT smoke after Midnight   Stop all vitamins and herbal supplements 7 days before surgery.   Take these medicines the morning of surgery with A SIP OF WATER: Inhalers, Atorvastatin , Clonazepam , Duloxetine , Keppra , Levothyroxine , Metoprolol , Lyrica   DO NOT TAKE ANY ORAL DIABETIC MEDICATIONS DAY OF YOUR SURGERY  Bring CPAP mask and tubing day of surgery.                              You may not have any metal on your body including hair pins, jewelry, and body piercing             Do not wear make-up, lotions, powders, perfumes, or deodorant  Do not wear nail polish including gel and S&S, artificial/acrylic nails, or any other type of covering on natural nails including finger and toenails. If you have artificial nails, gel coating, etc. that needs to be removed by a nail salon please have this removed prior to surgery or surgery may need to be canceled/ delayed if the surgeon/ anesthesia feels like they are unable to be safely monitored.   Do not shave  48 hours prior to surgery.    Do not bring valuables to the hospital. [facility] IS NOT             RESPONSIBLE   FOR VALUABLES.   Contacts, glasses, dentures or bridgework may not be worn into surgery.  DO NOT BRING YOUR HOME MEDICATIONS TO THE HOSPITAL. PHARMACY WILL DISPENSE MEDICATIONS LISTED ON YOUR MEDICATION LIST TO YOU DURING YOUR ADMISSION IN THE HOSPITAL!    Patients discharged on the day of surgery will not be allowed to drive home.  Someone NEEDS to stay with you for the first 24 hours after anesthesia.   Special Instructions: Bring a copy of your healthcare power of attorney and living will documents the day of surgery if you haven't scanned them before.              Please read over the following fact sheets you were given: IF YOU HAVE QUESTIONS ABOUT YOUR PRE-OP INSTRUCTIONS PLEASE CALL [phone].   If you received a COVID test during your pre-op visit  it is requested that you wear a mask when out in public, stay away from anyone that may not be feeling well and notify your surgeon if you develop symptoms. If you test positive for Covid or have been in contact with anyone that has tested positive in the last 10 days please notify you surgeon.    [place] - Preparing for Surgery Before surgery, you can play an important role.  Because skin is not sterile, your skin needs to be as free of germs as possible.  You can reduce the number of germs on your skin by washing with CHG (chlorahexidine gluconate) soap before surgery.  CHG is an antiseptic cleaner which kills germs and bonds with the skin to continue killing germs even after washing. Please DO NOT use if you have an allergy to CHG or antibacterial soaps.  If your skin becomes reddened/irritated stop using the CHG and inform your nurse when you arrive at Short Stay. Do not shave (including legs and underarms) for at least 48 hours prior to the first CHG shower.  You may shave your face/neck.  Please follow these instructions carefully:  1.  Shower with CHG Soap the night before surgery and the  morning of surgery.  2.  If you choose to wash your hair, wash your hair first as usual with your normal  shampoo.  3.  After you shampoo, rinse your hair and body thoroughly to remove the shampoo.  4.  Use CHG as you would any other liquid soap.  You can apply chg directly to the skin and wash.  Gently with a scrungie or clean washcloth.  5.  Apply the CHG Soap to your body ONLY FROM THE NECK DOWN.   Do   not use on face/ open                           Wound or open sores. Avoid contact with eyes, ears mouth and   genitals (private parts).                       Wash face,  Genitals (private parts) with your normal soap.             6.  Wash thoroughly, paying special attention to the area where your    surgery   will be performed.  7.  Thoroughly rinse your body with warm water from the neck down.  8.  DO NOT shower/wash with your normal soap after using and rinsing off the CHG Soap.                9.  Pat yourself dry with a clean towel.            10.  Wear clean pajamas.            11.  Place clean sheets on your bed the night of your first shower and do not  sleep with pets. Day of Surgery : Do not apply any lotions/deodorants the morning of surgery.  Please wear clean clothes to the hospital/surgery center.  FAILURE TO FOLLOW THESE INSTRUCTIONS MAY RESULT IN THE CANCELLATION OF YOUR SURGERY  PATIENT SIGNATURE_________________________________  NURSE SIGNATURE__________________________________  ________________________________________________________________________

## 2024-02-17 ENCOUNTER — Encounter (HOSPITAL_COMMUNITY): Payer: Self-pay | Admitting: Physician Assistant

## 2024-02-17 ENCOUNTER — Encounter (HOSPITAL_COMMUNITY)
Admission: RE | Admit: 2024-02-17 | Discharge: 2024-02-17 | Disposition: A | Discharge status: Home / Self Care | Source: Ambulatory Visit | Attending: Surgery | Admitting: Surgery

## 2024-02-17 ENCOUNTER — Other Ambulatory Visit: Payer: Self-pay

## 2024-02-17 ENCOUNTER — Encounter (HOSPITAL_COMMUNITY): Payer: Self-pay

## 2024-02-17 VITALS — BP 136/91 | HR 73 | Temp 98.2°F | Resp 16 | Ht 63.0 in | Wt 241.2 lb

## 2024-02-17 DIAGNOSIS — K851 Biliary acute pancreatitis without necrosis or infection: Secondary | ICD-10-CM | POA: Diagnosis not present

## 2024-02-17 DIAGNOSIS — Z01818 Encounter for other preprocedural examination: Secondary | ICD-10-CM | POA: Diagnosis present

## 2024-02-17 DIAGNOSIS — J449 Chronic obstructive pulmonary disease, unspecified: Secondary | ICD-10-CM | POA: Diagnosis not present

## 2024-02-17 DIAGNOSIS — I10 Essential (primary) hypertension: Secondary | ICD-10-CM | POA: Insufficient documentation

## 2024-02-17 HISTORY — DX: Chronic obstructive pulmonary disease, unspecified: J44.9

## 2024-02-17 HISTORY — DX: Cardiac murmur, unspecified: R01.1

## 2024-02-17 HISTORY — DX: Acute pancreatitis without necrosis or infection, unspecified: K85.90

## 2024-02-17 HISTORY — DX: Pneumonia, unspecified organism: J18.9

## 2024-02-17 LAB — BASIC METABOLIC PANEL WITH GFR
Anion gap: 13 (ref 5–15)
BUN: 25 mg/dL — ABNORMAL HIGH (ref 6–20)
CO2: 23 mmol/L (ref 22–32)
Calcium: 9.6 mg/dL (ref 8.9–10.3)
Chloride: 103 mmol/L (ref 98–111)
Creatinine, Ser: 0.56 mg/dL (ref 0.44–1.00)
GFR, Estimated: >60 mL/min (ref >60)
Glucose, Bld: 98 mg/dL (ref 70–99)
Potassium: 4.3 mmol/L (ref 3.5–5.1)
Sodium: 139 mmol/L (ref 135–145)

## 2024-02-17 LAB — CBC
HCT: 41.6 % (ref 36.0–46.0)
Hemoglobin: 12.9 g/dL (ref 12.0–15.0)
MCH: 29.1 pg (ref 26.0–34.0)
MCHC: 31 g/dL (ref 30.0–36.0)
MCV: 93.9 fL (ref 80.0–100.0)
Platelets: 399 K/uL (ref 150–400)
RBC: 4.43 MIL/uL (ref 3.87–5.11)
RDW: 14.8 % (ref 11.5–15.5)
WBC: 8 K/uL (ref 4.0–10.5)
nRBC: 0 % (ref 0.0–0.2)

## 2024-02-17 NOTE — Telephone Encounter (Signed)
 Copied from CRM #[id number]. Topic: Referral - New Referral/Order Request >> Feb 17, 2024  3:13 PM Kristin E wrote: Kendra Parker, Kendra Parker is calling other request    Include all details related to the request(s) below:  Patient is calling to request a referral for cardiology. Kendra Parker contacted the surgeon for her gallbladder removal that is scheduled for 9/29; requesting the patient see a cardiologist before she can have the surgery.   Patient is very upset that her surgery will be postponed. Please give her a call at earliest convenience.   Confirm and type the Best Contact Number below:  Patient/caller contact number:   [phone]          [] Home  [] Mobile  [] Work [] Other   [x] Okay to leave a voicemail   Medication List:  Current Outpatient Medications:  .  acetaminophen  (TYLENOL ) 325 mg tablet, Take 2 tablets by mouth every 6 (six) hours., Disp: , Rfl:  .  albuterol  HFA (PROVENTIL  HFA;VENTOLIN  HFA;PROAIR  HFA) 90 mcg/actuation inhaler, Inhale 2 puffs every 4 (four) hours as needed for wheezing., Disp: 8.5 g, Rfl: 0 .  atorvastatin  (LIPITOR) 20 mg tablet, Take 1 tablet (20 mg total) by mouth daily., Disp: 90 tablet, Rfl: 3 .  budesonide-formoteroL (Symbicort) 160-4.5 mcg/actuation inhaler, Inhale 2 puffs in the morning and 2 puffs before bedtime., Disp: 20.4 g, Rfl: 3 .  celecoxib (CeleBREX) 200 mg capsule, TAKE ONE CAPSULE BY MOUTH ONCE A DAY, Disp: 30 capsule, Rfl: 0 .  cholecalciferol (VITAMIN D3) 2,000 unit tablet, Take 2,000 Units by mouth., Disp: , Rfl:  .  clonazePAM  (KlonoPIN ) 0.5 mg tablet, Take 2 tablets (1 mg total) by mouth 2 (two) times a day., Disp: 120 tablet, Rfl: 2 .  cyanocobalamin-cobamamide (B12) 5,000-100 mcg lozg, , Disp: , Rfl:  .  diphenhydrAMINE -acetaminophen  (TYLENOL  PM) 25-500 mg tablet, Take 2 tablets by mouth nightly., Disp: , Rfl:  .  DULoxetine  (CYMBALTA ) 30 mg capsule, Take 1 capsule (30 mg total) by mouth daily., Disp: 90 capsule, Rfl: 3 .  DULoxetine   (CYMBALTA ) 60 mg capsule, Take 1 capsule (60 mg total) by mouth daily., Disp: 90 capsule, Rfl: 3 .  eptinezumab-jjmr (Vyepti) 100 mg/mL soln, Infuse 3 mLs (300 mg total) into the vein every 3 months. Dilute in 100mL NS bag prior to administration. Attach 0.2 micron filter and infuse via PIV/gravity line over 30 min (282ml/hr). Medication provided by [facility] at [phone]. Refills: PRN x 1 year, Disp: 3 mL, Rfl: 3 .  levETIRAcetam  (KEPPRA ) 250 mg tablet, TAKE TWO TABLETS BY MOUTH TWICE DAILY, Disp: 180 tablet, Rfl: 1 .  levothyroxine  (SYNTHROID ) 25 mcg tablet, Take 1 tablet (25 mcg total) by mouth daily., Disp: 90 tablet, Rfl: 3 .  methocarbamoL  (ROBAXIN ) 750 mg tablet, Take 1 tablet (750 mg total) by mouth 3 (three) times a day., Disp: 90 tablet, Rfl: 5 .  metoprolol  tartrate (LOPRESSOR ) 50 mg tablet, TAKE ONE TABLET BY MOUTH TWICE DAILY, Disp: 180 tablet, Rfl: 1 .  naproxen  (NAPROSYN ) 375 mg tablet, Take 375 mg by mouth., Disp: , Rfl:  .  pantoprazole  (PROTONIX ) 40 mg EC tablet, Take 1 tablet (40 mg total) by mouth 2 (two) times a day., Disp: 90 tablet, Rfl: 3 .  pregabalin  (LYRICA ) 25 mg capsule, Take 1 capsule (25 mg total) by mouth 2 (two) times a day., Disp: 180 capsule, Rfl: 1 .  QUEtiapine  (SEROquel ) 200 mg tablet, Take 1 tablet (200 mg total) by mouth nightly., Disp: 90 tablet, Rfl: 3 .  rimegepant (Nurtec ODT ) 75 mg disintegrating tablet, DISSOLVE ONE TABLET ON TONGUE EVERY OTHER DAY, Disp: 15 tablet, Rfl: 1 .  rizatriptan  MLT (MAXALT -MLT) 10 mg disintegrating tablet, Dissolve 1 tablet (10 mg total) on tongue as needed for migraine (not more than 3 triptans a week)., Disp: 10 tablet, Rfl: 3 .  spironolactone (ALDACTONE) 25 mg tablet, Take 1 tablet (25 mg total) by mouth daily., Disp: 90 tablet, Rfl: 1 .  ubrogepant  (Ubrelvy ) 100 mg tablet, TAKE ONE TABLET BY MOUTH ONCE AS NEEDED FOR MIGRAINE; MAY REPEAT DOSE IN 2 HOURS IF NO RELIEF; MAX 2 DOSES IN 24 HOURS, Disp: 16 tablet,  Rfl: 2 .  venlafaxine  (EFFEXOR  XR) 37.5 mg 24 hr capsule, Take 2 capsules (75 mg total) by mouth daily., Disp: 180 capsule, Rfl: 3     Medication Request/Refills: Pharmacy Information (if applicable)   [] Not Applicable       []  Pharmacy listed  Send Medication Request to:                                                 [] Pharmacy not listed (added to pharmacy list in Epic) Send Medication Request to:      Listed Pharmacies: [id number] - [place], [place] - [address] - PHONE: [phone] - FAX: [phone]

## 2024-02-17 NOTE — Telephone Encounter (Signed)
 Patient is saying that [facility] called her today and they are going to cancel her surgery because they need results of chest x-rays and PFT

## 2024-02-17 NOTE — Progress Notes (Addendum)
 Anesthesia Chart Review   Case: [id number] Date/Time: 02/20/24 1204   Procedure: LAPAROSCOPIC CHOLECYSTECTOMY WITH INTRAOPERATIVE CHOLANGIOGRAM   Anesthesia type: General   Diagnosis: Gallstone pancreatitis [K85.10]   Pre-op diagnosis: GALLSTONE PANCREATITIS   Location: [facility] ROOM 01 / [facility]   Surgeons: Lyndel Deward PARAS, MD       DISCUSSION:53 y.o. smoker with h/o COPD, HTN, gallstone pancreatitis scheduled for above procedure 02/20/2024 with Dr. Deward Lyndel.   She is also scheduled for ACDF with Dr. Gillie on 02/23/24 at [facility].   Pt was referred to cardiology for chest pain last year, seen 08/31/2022. Per notes at that time experiencing left sided chest pain intermittently, not exertional. Given chest pain and moderate risk factors, especially premature ASCVD in family, stress test and Echo were ordered. Pt did not have either study done.    Seen by pulmonology 02/01/2024 for shortness of breath.  Symbicort started, sleep study and pulmonary function test ordered. These have not been completed.   She reports at PAT visit chest pain has resolved.  She states she cannot climb a flight of stairs or walk to mailbox without shortness of breath. EKG with no acute findings.  No heart murmur appreciated.   Spoke with Dr. Maryclare, pt needs cardiac clearance prior to procedure. Discussed with Nat at [facility].  VS: BP (!) 136/91   Pulse 73   Temp 36.8 C (Oral)   Resp 16   Ht 5' 3 (1.6 m)   Wt 109.4 kg   SpO2 97%   BMI 42.73 kg/m   PROVIDERS: Jesus Elberta Gainer, FNP   LABS: Labs reviewed: Acceptable for surgery. (all labs ordered are listed, but only abnormal results are displayed)  Labs Reviewed  BASIC METABOLIC PANEL WITH GFR  CBC     IMAGES:   EKG:   CV:  Past Medical History:  Diagnosis Date   Anxiety    COPD (chronic obstructive pulmonary disease) (HCC)    mild   Heart murmur    Hypertension    Migraine    Numbness    Left leg   Pancreatitis    Pelvic  pain    Pneumonia     Past Surgical History:  Procedure Laterality Date   ABDOMINAL HYSTERECTOMY     Partial   DENTAL SURGERY     EAR MEATOPLASTY WITH FULL THICKNESS SKIN GRAFT Bilateral    multiple ear surgies   INNER EAR SURGERY     LAPAROSCOPIC OVARIAN CYSTECTOMY     Left   TONSILLECTOMY     WISDOM TOOTH EXTRACTION      MEDICATIONS:  carbamide peroxide (DEBROX) 6.5 % OTIC solution   albuterol  (VENTOLIN  HFA) 108 (90 Base) MCG/ACT inhaler   atorvastatin  (LIPITOR) 20 MG tablet   budesonide-formoterol (SYMBICORT) 160-4.5 MCG/ACT inhaler   celecoxib (CELEBREX) 200 MG capsule   Cholecalciferol (VITAMIN D3) 2000 UNITS TABS   ciprofloxacin-dexamethasone (CIPRODEX) OTIC suspension   clonazePAM  (KLONOPIN ) 0.5 MG tablet   Cyanocobalamin (VITAMIN B-12 PO)   diphenhydramine -acetaminophen  (TYLENOL  PM) 25-500 MG TABS tablet   DULoxetine  (CYMBALTA ) 30 MG capsule   DULoxetine  (CYMBALTA ) 60 MG capsule   Eptinezumab-jjmr (VYEPTI IV)   FIBER PO   levETIRAcetam  (KEPPRA ) 250 MG tablet   levothyroxine  (SYNTHROID ) 25 MCG tablet   methocarbamol  (ROBAXIN ) 750 MG tablet   metoprolol  (LOPRESSOR ) 50 MG tablet   Multiple Vitamins-Minerals (MULTIVITAMIN WITH MINERALS) tablet   NURTEC 75 MG TBDP   pantoprazole  (PROTONIX ) 40 MG tablet   pregabalin  (LYRICA ) 25 MG capsule  QUEtiapine  (SEROQUEL ) 200 MG tablet   [Paused] rizatriptan  (MAXALT -MLT) 10 MG disintegrating tablet   spironolactone (ALDACTONE) 25 MG tablet   Ubrogepant  (UBRELVY ) 100 MG TABS   venlafaxine  XR (EFFEXOR  XR) 37.5 MG 24 hr capsule   No current facility-administered medications for this encounter.     Harlene Hoots Ward, PA-C [facility] [phone]

## 2024-02-17 NOTE — Telephone Encounter (Signed)
 Pt aware referral to [facility]. She states the surgeons office told her to keep appt for this Monday for the surgery and they are going to talk with the anesthesia/poslpn

## 2024-02-17 NOTE — Telephone Encounter (Signed)
 Unable to speak with anyone at [facility]. Sent pft results through portal. NFN

## 2024-02-17 NOTE — Progress Notes (Addendum)
 [facility]  Initial Clinical Assessment  Specialty medication: Vyvgart  Education provided to: Kendra Parker via phone  Patient chart for Kendra Parker was reviewed to confirm appropriateness of therapy with regard to diagnosis, labs, co-morbidities, medications, allergies, drug-drug interactions, treatment history, and social history.   Summary note [facility] was contacted to provide Kendra Parker with Vyvgart therapy. Pharmacy will prepare medication and supplies for delivery on 10/1. [facility] is scheduled to see patient 02/27/24 for start of care. Patient's address, phone number, and emergency contact information were reviewed.  Education was provided regarding prescribed medication, dose, route, frequency, administration, drug interactions, missed dose instructions, monitoring requirements, infection prevention, and when to contact the provider's office. Potential side effects, prevention strategies, and what to do if they occur were reviewed. Instructions were provided for proper storage (Refrigerator), disposal, administration and use of medications and supplies, expirations date of medication, IV catheter care and related activity restrictions, and self-monitoring for therapy outcomes. Patient/caregiver was advised on our refill and delivery procedure as well as how to contact pharmacy in event of wrong/missed dose, pump malfunctions/alarms (if applicable), or general troubleshooting. No pump/equipment provided that needs returned upon discharge or hospital re-admission.  Kendra Parker has been on the medication in the past with good results and no problems at all.  The patient/caregiver has been educated on this plan via phone and is in agreement to fill this medication. All questions were answered to the best of my ability. The home infusion pharmacy will contact patient prior to refill and delivery of their medication. A pharmacist  consultation is available at any time per patient request. The patient has been provided a welcome packet containing information pertinent to [facility].   Next anticipated pharmacist consultation: 3 months and as needed  Medical History Problem List[1] Medical History[2]  Labs Pertinent labs were reviewed for VYEPTI, which are not required per the package insert for this medication.. Per my assessment of these labs, therapy is appropriate.  Lab Results  Component Value Date   WBC 7.20 12/14/2023   HGB 12.3 12/14/2023   HCT 37.3 12/14/2023   MCV 89.3 12/14/2023   PLT 317 12/14/2023   NA 141 12/14/2023   K 4.7 12/14/2023   CL 105 12/14/2023   CO2 27 12/14/2023   BUN 25 12/14/2023   CREATININE 0.56 (L) 12/14/2023   CALCIUM  9.8 12/14/2023   ALBUMIN 4.6 12/14/2023   BILITOT 0.2 (L) 12/14/2023   AST 16 12/14/2023   ALT 19 12/14/2023   ANIONGAP 9 12/14/2023   Allergies Allergies[3]  Anthropometric Data Actual body weight: 107 kg    Ideal body weight: 50.1 kg (110 lb 7.2 oz) Adjusted ideal body weight: 72.8 kg (160 lb 6.9 oz) Height: 1.575 m (5' 2) (02/07/2024 11:17 AM)  Medications Current Outpatient Medications  Medication Instructions   acetaminophen  (TYLENOL ) 325 mg tablet 2 tablets, Every 6 hours   albuterol  HFA (PROVENTIL  HFA;VENTOLIN  HFA;PROAIR  HFA) 90 mcg/actuation inhaler 2 puffs, inhalation, Every 4 hours PRN   atorvastatin  (LIPITOR) 20 mg, oral, Daily   budesonide-formoteroL (Symbicort) 160-4.5 mcg/actuation inhaler 2 puffs, inhalation, 2 times daily RT   celecoxib  (CELEBREX ) 200 mg, oral, Daily   cholecalciferol  (VITAMIN D3) 2,000 Units   clonazePAM  (KLONOPIN ) 1 mg, oral, 2 times daily   cyanocobalamin -cobamamide (B12) 5,000-100 mcg lozg    diphenhydrAMINE -acetaminophen  (TYLENOL  PM) 25-500 mg tablet 2 tablets, Nightly   DULoxetine  (CYMBALTA ) 60 mg, oral,  Daily   DULoxetine  (CYMBALTA )  30 mg, oral, Daily   eptinezumab-jjmr (Vyepti) 100 mg/mL soln Infuse 3 mLs (300 mg total) into the vein every 3 months. Dilute in 100mL NS bag prior to administration. Attach 0.2 micron filter and infuse via PIV/gravity line over 30 min (254ml/hr). Medication provided by [facility] at [phone]. Refills: PRN x 1 year   levETIRAcetam  (KEPPRA ) 500 mg, oral, 2 times daily   levothyroxine  (SYNTHROID ) 25 mcg, oral, Daily   methocarbamoL  (ROBAXIN ) 750 mg, oral, 3 times daily   metoprolol  tartrate (LOPRESSOR ) 50 mg, oral, 2 times daily   naproxen  (NAPROSYN ) 375 mg   pantoprazole  (PROTONIX ) 40 mg, oral, 2 times daily   pregabalin  (LYRICA ) 25 mg, oral, 2 times daily   QUEtiapine  (SEROQUEL ) 200 mg, oral, Nightly   rimegepant (Nurtec ODT ) 75 mg disintegrating tablet DISSOLVE ONE TABLET ON TONGUE EVERY OTHER DAY   rizatriptan  MLT (MAXALT -MLT) 10 mg, oral, As needed   spironolactone  (ALDACTONE ) 25 mg, oral, Daily   ubrogepant  (Ubrelvy ) 100 mg tablet TAKE ONE TABLET BY MOUTH ONCE AS NEEDED FOR MIGRAINE; MAY REPEAT DOSE IN 2 HOURS IF NO RELIEF; MAX 2 DOSES IN 24 HOURS   venlafaxine  (EFFEXOR  XR) 75 mg, oral, Daily    Pregnancy/Breastfeeding Status Patient is not pregnant or breastfeeding.  Patient's treatment goal(s) are: To maintain adequate disease or symptom control  Pharmacy Intake Team has Reviewed Functional status/abilities (mobility, continence, age-related abilities, activities of daily living) as relevant to drug therapy to be provided, alcohol/drug use or dependency, patient financial responsibility form and HIPAA privacy notice, home environment is suitable for home cares services (electricity, phone access, refrigerator, running water), and patient/caregiver appears to be willing and possess the cognitive and emotional abilities necessary for the prescribed home therapy.  Pharmacy contact information: [facility]  Pharmacy Hours of operation: Monday - Friday 7:00 am - 6:00 pm Phone: [phone]  Pharmacist Checklist Therapy plan updated to align with home infusion orders, if applicable (title, supportive meds, etc.) Yes Was this a transition from clinic to home? No Education diagnosis or appropriate diagnosis selected in this encounter? Yes Patient enrolled in [facility] program? Yes Appropriately titled? Yes    Dose Calendar IV Access: PIV Yearly assessment performed on: 02/17/2024 Helpful notes: none                                    January   February   March     S M T W T F S  S M T W T F S  S M T W T F S          1 2 3 4              1              1    5 6 7 8 9 10 11  2 3 4 5 6 7 8  2 3 4 5 6 7 8    12 13 14 15 16 17 18  9 10 11 12 13 14 15  9 10 11 12 13 14 15    19 20 21 22 23 24 25  16 17 18 19 20 21 22  16 17 18 19 20 21 22    26 27 28 29 30 31    23 24  25  26 27 28    23 24 25 26 27 28 29                                  30 31                          April  May  June    S M T W T F S  S M T W T F S  S M T W T F S        1 2 3 4 5          1 2 3  1 2 3 4 5 6 7    6 7 8 9 10 11 12  4 5 6 7 8 9 10  8 9 10 11 12 13 14    13 14 15 16 17 18 19  11 12 13 14 15 16 17  15 16 17 18 19 20 21    20 21 22 23 24 25 26  18 19 20 21 22 23 24  22 23 24 25 26 27 28    27 28 29 30        25 26 27 28 29 30 31  29 30                                                                    Vyepti Dcd (TN 6/27 per MD)    July  August  September    S M T W T F S  S M T W T F S  S M T W T F S        1 2 3 4 5            1 2    1 2 3 4 5 6    6 7 8 9 10 11 12  3 4 5 6 7 8 9  7 8 9 10 11 12 13    13 14 15 16 17 18 19  10 11 12 13 14 15 16  14 15 16 17 18 19 20    20 21 22 23 24 25 26  17 18 19 20 21 22 23  21 22 23 24 25 26 27    27 28 29 30 31      24 25 26 27 28 29 30  28 29 30                           31                                      Vyepti restarted (OV 02/07/24)     October  November  December    S M T W T F S  S M T W T F S  S M T W T F S          1 2 3  4  1    1 2 3 4 5 6    5 6 7 8 9 10 11  2 3 4 5 6 7 8  7 8 9 10 11 12 13    12 13 14 15 16 17 18  9 10 11 12 13 14 15  14 15 16 17 18 19 20    19 20 21 22 23 24 25  16 17 18 19 20 21 22  21 22 23 24 25 26 27    26 27 28 29 30 31    23 24 25 26 27 28 29  28 29 30 31                         30                                       Thank you, Amy McFerrin, [facility] 02/17/2024 9:03 AM, Infusion Pharmacist Please contact our pharmacy at [phone] if you would like to discuss any concerns relating to this patient via phone.       [1] Patient Active Problem List Diagnosis   Blurred vision, bilateral   Chronic pelvic pain in female   Depression   Dyspareunia   Eating disorder   Family history of breast cancer   Obesity (BMI 35.0-39.9 without comorbidity)   Tobacco abuse   Hyperlipidemia, mixed   Essential hypertension   Psychophysiological insomnia   Chronic bilateral low back pain without sciatica   Perforation of both tympanic membranes   Bilateral hearing loss   Referred otalgia of left ear   Chronic maxillary sinusitis   Chronic migraine without aura, intractable, with status migrainosus   Acquired hypothyroidism   Anemia   Aortic atherosclerosis (HCC)   Paraseptal emphysema (HCC)   Hepatic steatosis   Occipital neuralgia of left side   Morbid obesity with body mass index (BMI) of 40.0 to 44.9 in adult (CMS/HCC)   Hand numbness  [2] Past Medical History: Diagnosis Date   Chronic female pelvic pain    Hypertension    Migraine    Pancreatitis (CMD)   [3] Allergies Allergen Reactions   Penicillins Itching, Swelling and Rash   Lamotrigine Rash    Only at 100 mg is intolerable-gets a rash from head to toe   Topiramate Other (See Comments)    Visual problems

## 2024-02-17 NOTE — Telephone Encounter (Signed)
 Pt went for pre surg at hospital. They did CXR,EXG . Both were normal. She saw pulmonary this week and all was good. She said the surgeon office called ans the anesthesia wants her to be cleared by cardiology. She does have a heart murmur and saw a cardiologist before and he recommended a stress test but he was ok with her not doing it. She's not having any symptoms. Should I put in urgent referral? She said surgery has not been cancelled.

## 2024-02-17 NOTE — Progress Notes (Signed)
 Pt had PST appt this morning at [facility], due to medical history pt was assessed by PA. After PA discussed pt health history with anesthesia it was determined pt would need cardiac clearance before proceeding. [facility] notified.  Pt returned to [facility] admitting office this afternoon screaming, cussing, demanding I will be having my surgery on Monday. Staff reached out to this RN for service recovery. Pt states that surgeons office called to inform her that surgery would be delayed/cancelled until cardiac clearance is obtained.This RN tried to explain need for cardiac clearance and that this is in the best interest of the patient's safety during the surgery. Pt continued to cuss and belittle this RN and other staff. Pt unable to understand the need for cardiac clearance. Pt left very angry.

## 2024-02-20 ENCOUNTER — Encounter (HOSPITAL_COMMUNITY): Payer: Self-pay | Admitting: Surgery

## 2024-02-20 ENCOUNTER — Other Ambulatory Visit: Payer: Self-pay

## 2024-02-20 ENCOUNTER — Ambulatory Visit: Attending: Cardiovascular Disease | Admitting: Cardiovascular Disease

## 2024-02-20 ENCOUNTER — Encounter (HOSPITAL_COMMUNITY): Admission: RE | Payer: Self-pay | Source: Home / Self Care

## 2024-02-20 ENCOUNTER — Telehealth: Payer: Self-pay

## 2024-02-20 ENCOUNTER — Ambulatory Visit (HOSPITAL_COMMUNITY): Admission: RE | Admit: 2024-02-20 | Source: Home / Self Care | Admitting: Surgery

## 2024-02-20 VITALS — BP 136/72 | HR 98 | Ht 63.0 in | Wt 245.0 lb

## 2024-02-20 DIAGNOSIS — Z01810 Encounter for preprocedural cardiovascular examination: Secondary | ICD-10-CM

## 2024-02-20 SURGERY — LAPAROSCOPIC CHOLECYSTECTOMY WITH INTRAOPERATIVE CHOLANGIOGRAM
Anesthesia: General

## 2024-02-20 NOTE — Progress Notes (Signed)
 SDW call  Patient was given pre-op instructions over the phone. Patient verbalized understanding of instructions provided.     PCP - [facility], FNP Cardiologist - Dr. Lonni Cash, LOV 02/20/2024, cardiac clearance Pulmonary:    PPM/ICD - denies Device Orders - na Rep Notified - na   Chest x-ray - 02/16/2024, [facility] EKG -  02/17/2024 Stress Test - ECHO -  Cardiac Cath -  PFT: 02/14/2024  Sleep Study/sleep apnea/CPAP: denies  Non-diabetic  Blood Thinner Instructions: denies Aspirin Instructions:denies   ERAS Protcol - Clears until 1230   Anesthesia review: Yes. Heart murmur, HTN, COPD, needs cardiac clearance   Patient denies shortness of breath, fever, cough and chest pain over the phone call  Your procedure is scheduled on Tuesday February 21, 2024  Report to [facility] Main Entrance A at 1300 pm., then check in with the Admitting office.  Call this number if you have problems the morning of surgery:  [phone]   If you have any questions prior to your surgery date call [phone]: Open Monday-Friday 8am-4pm If you experience any cold or flu symptoms such as cough, fever, chills, shortness of breath, etc. between now and your scheduled surgery, please notify us  at the above number    Remember:  Do not eat after midnight the night before your surgery  You may drink clear liquids until  1230 the day of your surgery.   Clear liquids allowed are: Water, Non-Citrus Juices (without pulp), Carbonated Beverages, Clear Tea, Black Coffee ONLY (NO MILK, CREAM OR POWDERED CREAMER of any kind), and Gatorade   Take these medicines the morning of surgery with A SIP OF WATER:  Atorvastatin , symbicort, klonopin , cymbalta , keppra , synthroid , robaxin , metoprolol , protonix , lyrica , effexor , nurtec (if day to take)  As needed: Albuterol , ubrelvy   As of today, STOP taking any Aspirin (unless otherwise instructed by your surgeon) Aleve , Naproxen , Ibuprofen, Motrin, Advil,  Goody's, BC's, all herbal medications, fish oil, and all vitamins. This includes your Celebrex

## 2024-02-20 NOTE — Progress Notes (Signed)
 I spoke with Dr Patrisha this morning and made him aware that per PST anesthesia note this patient was in need of cardiac clearance.  Dr Patrisha stated that we need to follow the guidelines for the note and that this patient will need cardiac clearance before surgery.   Dr Lyndel stated he would review the patients chart and give her a call

## 2024-02-20 NOTE — Progress Notes (Signed)
 Chief Complaint  Patient presents with   New Patient (Initial Visit)    Cardiac murmur, pre-operative cardiovascular exam   History of Present Illness: 53 yo female with history of anxiety, anemia, COPD, HLD, HTN and hypothyroidism who is here today as a new consult, referred by Elberta Cone, FNP, for pre-operative cardiovascular risk assessment prior to planned laparoscopic cholecystectomy yesterday and cervical disc surgery later this week. Normal EKG and chest x-ray last week as part of her pre-op testing. Given history of a cardiac murmur, she was asked to see cardiology prior to her procedure. She tells me today that she feels great. No chest pain, dyspnea, dizziness, near syncope, syncope or palpitations.    Primary Care Physician: Cone Elberta Gainer, FNP   Past Medical History:  Diagnosis Date   Anemia    Anxiety    Aortic atherosclerosis    Blurred vision, bilateral    COPD (chronic obstructive pulmonary disease) (HCC)    mild   Eating disorder    Hand numbness    Hearing loss    Heart murmur    Hepatic steatosis    HLD (hyperlipidemia)    Hypertension    Hypothyroidism    Low back pain    Migraine    Numbness    Left leg   Obesity    Occipital neuralgia of left side    Pancreatitis    Paraseptal emphysema (HCC)    Pelvic pain    Perforation of both tympanic membranes    Pneumonia    Psychophysiological insomnia    Referred otalgia of left ear    Sinusitis     Past Surgical History:  Procedure Laterality Date   ABDOMINAL HYSTERECTOMY     Partial   DENTAL SURGERY     EAR MEATOPLASTY WITH FULL THICKNESS SKIN GRAFT Bilateral    multiple ear surgies   INNER EAR SURGERY     LAPAROSCOPIC OVARIAN CYSTECTOMY     Left   TONSILLECTOMY     WISDOM TOOTH EXTRACTION      Current Outpatient Medications  Medication Sig Dispense Refill   albuterol  (VENTOLIN  HFA) 108 (90 Base) MCG/ACT inhaler inhale 2 puffs into the lungs by mouth every 4 hours as needed  for wheezing     atorvastatin  (LIPITOR) 20 MG tablet Take 20 mg by mouth in the morning.     budesonide-formoterol (SYMBICORT) 160-4.5 MCG/ACT inhaler Inhale 2 puffs into the lungs 2 (two) times daily.     carbamide peroxide (DEBROX) 6.5 % OTIC solution Place 4 drops into the left ear 2 (two) times daily.     celecoxib (CELEBREX) 200 MG capsule Take 200 mg by mouth at bedtime.     Cholecalciferol (VITAMIN D3) 2000 UNITS TABS Take 2,000 Units by mouth in the morning.     clonazePAM  (KLONOPIN ) 0.5 MG tablet Take 1 mg by mouth 2 (two) times daily.     Cyanocobalamin (VITAMIN B-12 PO) Take 1 capsule by mouth in the morning.     diphenhydramine -acetaminophen  (TYLENOL  PM) 25-500 MG TABS tablet Take 2 tablets by mouth at bedtime.     DULoxetine  (CYMBALTA ) 30 MG capsule Take 30 mg by mouth at bedtime. 30 mg + 60 mg=90 mg     DULoxetine  (CYMBALTA ) 60 MG capsule Take 60 mg by mouth at bedtime. 60 mg + 30 mg=90 mg     Eptinezumab-jjmr (VYEPTI IV) Inject 300 mg into the vein every 3 (three) months. VYEPTI     FIBER PO Take  3 tablets by mouth in the morning and at bedtime.     levETIRAcetam  (KEPPRA ) 250 MG tablet Take 500 mg by mouth 2 (two) times daily.     levothyroxine  (SYNTHROID ) 25 MCG tablet Take 25 mcg by mouth daily before breakfast.     methocarbamol  (ROBAXIN ) 750 MG tablet Take every 8 hours to treat acute migraines. #30 allowed per month. (Patient taking differently: Take 750 mg by mouth 3 (three) times daily.) 30 tablet 5   metoprolol  (LOPRESSOR ) 50 MG tablet Take 50 mg by mouth 2 (two) times daily.      Multiple Vitamins-Minerals (MULTIVITAMIN WITH MINERALS) tablet Take 1 tablet by mouth in the morning.     NURTEC 75 MG TBDP Take 75 mg by mouth every other day.     pantoprazole  (PROTONIX ) 40 MG tablet Take 1 tablet (40 mg total) by mouth 2 (two) times daily before a meal. 60 tablet 0   pregabalin  (LYRICA ) 25 MG capsule Take 1 capsule (25 mg total) by mouth 3 (three) times daily. 60 capsule 0    QUEtiapine  (SEROQUEL ) 200 MG tablet Take 200 mg by mouth at bedtime.     [Paused] rizatriptan  (MAXALT -MLT) 10 MG disintegrating tablet Take 10 mg by mouth as needed for migraine (nausea). No more than 4 doses of any combination of triptans per week per neurology     spironolactone (ALDACTONE) 25 MG tablet Take 25 mg by mouth in the morning.     Ubrogepant  (UBRELVY ) 100 MG TABS Take 100 mg by mouth daily as needed (migraines). Max of 2 doses per week (per neurology)     venlafaxine  XR (EFFEXOR  XR) 37.5 MG 24 hr capsule Take 1 capsule (37.5 mg total) by mouth daily with breakfast. 30 capsule 11   ciprofloxacin-dexamethasone (CIPRODEX) OTIC suspension Place 4 drops into both ears 2 (two) times daily. (Patient not taking: Reported on 02/20/2024)     No current facility-administered medications for this visit.    Allergies  Allergen Reactions   Lamotrigine Rash    Only at 100 mg is intolerable-gets a rash from head to toe   Topiramate Other (See Comments)    Visual problems   Penicillins Rash    Social History   Socioeconomic History   Marital status: Married    Spouse name: Not on file   Number of children: 1   Years of education: HS   Highest education level: Not on file  Occupational History   Occupation: Unemployed  Tobacco Use   Smoking status: Former    Types: Cigarettes, E-cigarettes   Smokeless tobacco: Never   Tobacco comments:    Uses vape  Vaping Use   Vaping status: Every Day  Substance and Sexual Activity   Alcohol use: No    Alcohol/week: 0.0 standard drinks of alcohol   Drug use: Not Currently   Sexual activity: Yes    Birth control/protection: Surgical  Other Topics Concern   Not on file  Social History Narrative   Lives at home with her husband.   Left-handed.   24 oz caffeine per day.   Social Drivers of Corporate investment banker Strain: Not on file  Food Insecurity: Low Risk  (11/30/2023)   Received from [facility]   Hunger Vital Sign    Within  the past 12 months, you worried that your food would run out before you got money to buy more: Never true    Within the past 12 months, the food you bought just didn't  last and you didn't have money to get more. : Never true  Transportation Needs: No Transportation Needs (11/30/2023)   Received from [facility]    In the past 12 months, has lack of reliable transportation kept you from medical appointments, meetings, work or from getting things needed for daily living? : No  Physical Activity: Not on file  Stress: Not on file  Social Connections: Not on file  Intimate Partner Violence: Not At Risk (09/27/2023)   Humiliation, Afraid, Rape, and Kick questionnaire    Fear of Current or Ex-Partner: No    Emotionally Abused: No    Physically Abused: No    Sexually Abused: No    Family History  Problem Relation Age of Onset   Diabetes Mother    Multiple myeloma Mother    Breast cancer Mother    Seizures Father    Diabetes Maternal Grandmother    Cataracts Maternal Grandmother    Cataracts Paternal Grandmother    Arthritis Paternal Grandmother    Cataracts Paternal Grandfather    Arthritis Paternal Grandfather    Stroke Paternal Grandfather    Cancer Maternal Aunt    Cancer Maternal Uncle     Review of Systems:  As stated in the HPI and otherwise negative.   BP 136/72 (BP Location: Right Arm, Patient Position: Sitting, Cuff Size: Large)   Pulse 98   Ht 5' 3 (1.6 m)   Wt 245 lb (111.1 kg)   SpO2 95%   BMI 43.40 kg/m   Physical Examination: General: Well developed, well nourished, NAD  HEENT: OP clear, mucus membranes moist  SKIN: warm, dry. No rashes. Neuro: No focal deficits  Musculoskeletal: Muscle strength 5/5 all ext  Psychiatric: Mood and affect normal  Neck: No JVD, no carotid bruits, no thyromegaly, no lymphadenopathy.  Lungs:Clear bilaterally, no wheezes, rhonci, crackles Cardiovascular: Regular rate and rhythm. No murmurs, gallops or  rubs. Abdomen:Soft. Bowel sounds present. Non-tender.  Extremities: No lower extremity edema. Pulses are 2 + in the bilateral DP/PT.  EKG:  EKG is not ordered today. The ekg ordered today demonstrates   Recent Labs: 09/27/2023: TSH 1.692 10/20/2023: ALT 35 10/22/2023: Magnesium 1.8 02/17/2024: BUN 25; Creatinine, Ser 0.56; Hemoglobin 12.9; Platelets 399; Potassium 4.3; Sodium 139   Lipid Panel    Component Value Date/Time   TRIG 77 09/27/2023 1016     Wt Readings from Last 3 Encounters:  02/20/24 245 lb (111.1 kg)  02/17/24 241 lb 3.2 oz (109.4 kg)  10/21/23 234 lb 15.8 oz (106.6 kg)    Assessment and Plan:   1. Pre-operative cardiovascular examination: She has a normal cardiac exam, normal EKG and normal chest x-ray. She has no symptoms. No further cardiac workup. Stress testing is not indicated. She can proceed with her planned surgical procedures.   Labs/ tests ordered today include:  No orders of the defined types were placed in this encounter.  Disposition:   F/U with me as needed  Signed, Lonni Cash, MD, [facility] 02/20/2024 4:20 PM    [facility] [address] [place], [place], [place]  [address] Phone: [phone]; Fax: [phone]

## 2024-02-20 NOTE — Anesthesia Preprocedure Evaluation (Signed)
 Anesthesia Evaluation  Patient identified by MRN, date of birth, ID band Patient awake    Reviewed: Allergy & Precautions, NPO status , Patient's Chart, lab work & pertinent test results  History of Anesthesia Complications Negative for: history of anesthetic complications  Airway Mallampati: II  TM Distance: >3 FB Neck ROM: Full    Dental no notable dental hx. (+) Dental Advisory Given, Upper Dentures, Missing,    Pulmonary shortness of breath, COPD, former smoker   Pulmonary exam normal breath sounds clear to auscultation       Cardiovascular hypertension, Pt. on medications (-) angina (-) CAD and (-) Past MI Normal cardiovascular exam Rhythm:Regular Rate:Normal     Neuro/Psych  Headaches PSYCHIATRIC DISORDERS Anxiety Depression     Neuromuscular disease    GI/Hepatic   Endo/Other  Hypothyroidism  Class 3 obesity  Renal/GU Lab Results      Component                Value               Date                      NA                       139                 02/17/2024                CL                       103                 02/17/2024                K                        4.3                 02/17/2024                CO2                      23                  02/17/2024                BUN                      25 (H)              02/17/2024                CREATININE               0.56                02/17/2024                GFRNONAA                 >60                 02/17/2024                CALCIUM                   9.6  02/17/2024                PHOS                     5.7 (H)             10/06/2023                ALBUMIN                  3.1 (L)             10/20/2023                GLUCOSE                  98                  02/17/2024                Musculoskeletal Left arm numbness related to cervical radiculopathy   Abdominal   Peds  Hematology  (+) REFUSES BLOOD PRODUCTSLab Results       Component                Value               Date                      WBC                      8.0                 02/17/2024                HGB                      12.9                02/17/2024                HCT                      41.6                02/17/2024                MCV                      93.9                02/17/2024                PLT                      399                 02/17/2024              Anesthesia Other Findings All: Topirimate, lamotrigine, pcn  Reproductive/Obstetrics                              Anesthesia Physical Anesthesia Plan  ASA: 3  Anesthesia Plan: General   Post-op Pain Management: Precedex and Tylenol  PO (pre-op)*   Induction: Intravenous  PONV Risk Score and Plan: 4 or greater and Treatment may vary due to age or medical condition,  Ondansetron , Midazolam and Dexamethasone  Airway Management Planned: Oral ETT and Video Laryngoscope Planned  Additional Equipment: None  Intra-op Plan:   Post-operative Plan: Extubation in OR  Informed Consent: I have reviewed the patients History and Physical, chart, labs and discussed the procedure including the risks, benefits and alternatives for the proposed anesthesia with the patient or authorized representative who has indicated his/her understanding and acceptance.     Dental advisory given  Plan Discussed with: CRNA and Surgeon  Anesthesia Plan Comments:          Anesthesia Quick Evaluation

## 2024-02-20 NOTE — Patient Instructions (Signed)
 Medication Instructions:  .Your physician recommends that you continue on your current medications as directed. Please refer to the Current Medication list given to you today.  *If you need a refill on your cardiac medications before your next appointment, please call your pharmacy*  Lab Work: none If you have labs (blood work) drawn today and your tests are completely normal, you will receive your results only by: MyChart Message (if you have MyChart) OR A paper copy in the mail If you have any lab test that is abnormal or we need to change your treatment, we will call you to review the results.  Testing/Procedures: none  Follow-Up: At [facility], you and your health needs are our priority.  As part of our continuing mission to provide you with exceptional heart care, our providers are all part of one team.  This team includes your primary Cardiologist (physician) and Advanced Practice Providers or APPs (Physician Assistants and Nurse Practitioners) who all work together to provide you with the care you need, when you need it.  Your next appointment:   As needed  Provider:   Lonni Cash, MD    We recommend signing up for the patient portal called MyChart.  Sign up information is provided on this After Visit Summary.  MyChart is used to connect with patients for Virtual Visits (Telemedicine).  Patients are able to view lab/test results, encounter notes, upcoming appointments, etc.  Non-urgent messages can be sent to your provider as well.   To learn more about what you can do with MyChart, go to [email or web address].   Other Instructions

## 2024-02-20 NOTE — Telephone Encounter (Signed)
 Primary Cardiologist:None  Chart reviewed as part of pre-operative protocol coverage. Because of Pearley Millington Noori's past medical history and no prior cardiology visit, she will require a follow-up visit in order to better assess preoperative cardiovascular risk.  Pre-op covering staff: - Please schedule appointment and call patient to inform them. - Please contact requesting surgeon's office via preferred method (i.e, phone, fax) to inform them of need for appointment prior to surgery.   Rosaline EMERSON Bane, NP-C  02/20/2024, 2:11 PM [address] [place], [place] [address] Office [phone] Fax [phone]

## 2024-02-20 NOTE — Telephone Encounter (Signed)
 This type of referral is usually done through her PCP office.

## 2024-02-20 NOTE — Telephone Encounter (Signed)
 Pre op clearance will be addressed at office visit today

## 2024-02-20 NOTE — Telephone Encounter (Signed)
   Pre-operative Risk Assessment    Patient Name: Kendra Parker  DOB: 23-Jan-1971 MRN: [id number]   Date of last office visit: NONE Date of next office visit: 02/20/24 Kendra CASH, MD (NP APPT)   Request for Surgical Clearance    Procedure:  LAP CHOLE SURGERY  Date of Surgery:  Clearance TBD                                Surgeon:  DEWARD FOY, MD Surgeon's Group or Practice Name:  [facility] Phone number:  [phone] Fax number:  [phone]  ATTN: Kendra Parker, CMA   Type of Clearance Requested:   - Medical    Type of Anesthesia:  General    Additional requests/questions:    Signed, Kendra Parker   02/20/2024, 1:34 PM

## 2024-02-21 ENCOUNTER — Other Ambulatory Visit: Payer: Self-pay

## 2024-02-21 ENCOUNTER — Ambulatory Visit (HOSPITAL_COMMUNITY)

## 2024-02-21 ENCOUNTER — Encounter (HOSPITAL_COMMUNITY): Payer: Self-pay | Admitting: Surgery

## 2024-02-21 ENCOUNTER — Ambulatory Visit (HOSPITAL_COMMUNITY): Admitting: Anesthesiology

## 2024-02-21 ENCOUNTER — Ambulatory Visit (HOSPITAL_COMMUNITY)
Admission: RE | Admit: 2024-02-21 | Discharge: 2024-02-21 | Disposition: A | Discharge status: Home / Self Care | Attending: Surgery | Admitting: Surgery

## 2024-02-21 ENCOUNTER — Encounter (HOSPITAL_COMMUNITY): Admission: RE | Disposition: A | Payer: Self-pay | Source: Home / Self Care | Attending: Surgery

## 2024-02-21 DIAGNOSIS — K811 Chronic cholecystitis: Secondary | ICD-10-CM | POA: Insufficient documentation

## 2024-02-21 DIAGNOSIS — E66813 Obesity, class 3: Secondary | ICD-10-CM | POA: Insufficient documentation

## 2024-02-21 DIAGNOSIS — Z87891 Personal history of nicotine dependence: Secondary | ICD-10-CM | POA: Diagnosis not present

## 2024-02-21 DIAGNOSIS — I10 Essential (primary) hypertension: Secondary | ICD-10-CM | POA: Diagnosis not present

## 2024-02-21 DIAGNOSIS — J449 Chronic obstructive pulmonary disease, unspecified: Secondary | ICD-10-CM | POA: Insufficient documentation

## 2024-02-21 DIAGNOSIS — E039 Hypothyroidism, unspecified: Secondary | ICD-10-CM | POA: Insufficient documentation

## 2024-02-21 DIAGNOSIS — Z6841 Body Mass Index (BMI) 40.0 and over, adult: Secondary | ICD-10-CM | POA: Insufficient documentation

## 2024-02-21 DIAGNOSIS — K851 Biliary acute pancreatitis without necrosis or infection: Secondary | ICD-10-CM | POA: Insufficient documentation

## 2024-02-21 HISTORY — DX: Dyspnea, unspecified: R06.00

## 2024-02-21 HISTORY — PX: CHOLECYSTECTOMY: SHX55

## 2024-02-21 SURGERY — LAPAROSCOPIC CHOLECYSTECTOMY WITH INTRAOPERATIVE CHOLANGIOGRAM
Anesthesia: General | Site: Abdomen

## 2024-02-21 MED ORDER — LIDOCAINE 2% (20 MG/ML) 5 ML SYRINGE
INTRAMUSCULAR | Status: AC
  Filled 2024-02-21: qty 5

## 2024-02-21 MED ORDER — DEXAMETHASONE SODIUM PHOSPHATE 10 MG/ML IJ SOLN
INTRAMUSCULAR | Status: DC | PRN
  Administered 2024-02-21: 5 mg via INTRAVENOUS

## 2024-02-21 MED ORDER — ONDANSETRON HCL 4 MG/2ML IJ SOLN
4.0000 mg | Freq: Once | INTRAMUSCULAR | Status: AC | PRN
  Administered 2024-02-21: 4 mg via INTRAVENOUS

## 2024-02-21 MED ORDER — 0.9 % SODIUM CHLORIDE (POUR BTL) OPTIME
TOPICAL | Status: DC | PRN
  Administered 2024-02-21: 1000 mL

## 2024-02-21 MED ORDER — ACETAMINOPHEN 500 MG PO TABS
1000.0000 mg | ORAL_TABLET | Freq: Once | ORAL | Status: AC
  Administered 2024-02-21: 1000 mg via ORAL
  Filled 2024-02-21: qty 2

## 2024-02-21 MED ORDER — IPRATROPIUM-ALBUTEROL 0.5-2.5 (3) MG/3ML IN SOLN
3.0000 mL | Freq: Once | RESPIRATORY_TRACT | Status: AC
  Administered 2024-02-21: 3 mL via RESPIRATORY_TRACT

## 2024-02-21 MED ORDER — BUPIVACAINE-EPINEPHRINE 0.25% -1:200000 IJ SOLN
INTRAMUSCULAR | Status: DC | PRN
  Administered 2024-02-21: 30 mL

## 2024-02-21 MED ORDER — ORAL CARE MOUTH RINSE: 15.0000 mL | Freq: Once | OROMUCOSAL | Status: AC

## 2024-02-21 MED ORDER — CEFAZOLIN SODIUM-DEXTROSE 2-4 GM/100ML-% IV SOLN
2.0000 g | INTRAVENOUS | Status: AC
  Administered 2024-02-21: 2 g via INTRAVENOUS
  Filled 2024-02-21: qty 100

## 2024-02-21 MED ORDER — HYDROMORPHONE HCL 1 MG/ML IJ SOLN
INTRAMUSCULAR | Status: AC
  Filled 2024-02-21: qty 1

## 2024-02-21 MED ORDER — MIDAZOLAM HCL 2 MG/2ML IJ SOLN
INTRAMUSCULAR | Status: DC | PRN
  Administered 2024-02-21: 2 mg via INTRAVENOUS

## 2024-02-21 MED ORDER — SODIUM CHLORIDE 0.9 % IV SOLN
INTRAVENOUS | Status: DC | PRN
  Administered 2024-02-21: 100 mL

## 2024-02-21 MED ORDER — CHLORHEXIDINE GLUCONATE 0.12 % MT SOLN
15.0000 mL | Freq: Once | OROMUCOSAL | Status: AC
  Administered 2024-02-21: 15 mL via OROMUCOSAL
  Filled 2024-02-21: qty 15

## 2024-02-21 MED ORDER — PROPOFOL 10 MG/ML IV BOLUS
INTRAVENOUS | Status: DC | PRN
  Administered 2024-02-21: 200 mg via INTRAVENOUS

## 2024-02-21 MED ORDER — LACTATED RINGERS IV SOLN: INTRAVENOUS | Status: DC

## 2024-02-21 MED ORDER — ONDANSETRON HCL 4 MG/2ML IJ SOLN
INTRAMUSCULAR | Status: DC | PRN
  Administered 2024-02-21: 4 mg via INTRAVENOUS

## 2024-02-21 MED ORDER — KETOROLAC TROMETHAMINE 30 MG/ML IJ SOLN
INTRAMUSCULAR | Status: AC
  Filled 2024-02-21: qty 1

## 2024-02-21 MED ORDER — FENTANYL CITRATE (PF) 250 MCG/5ML IJ SOLN
INTRAMUSCULAR | Status: AC
  Filled 2024-02-21: qty 5

## 2024-02-21 MED ORDER — SUGAMMADEX SODIUM 200 MG/2ML IV SOLN
INTRAVENOUS | Status: DC | PRN
  Administered 2024-02-21: 221.4 mg via INTRAVENOUS

## 2024-02-21 MED ORDER — ROCURONIUM BROMIDE 10 MG/ML (PF) SYRINGE
PREFILLED_SYRINGE | INTRAVENOUS | Status: AC
  Filled 2024-02-21: qty 10

## 2024-02-21 MED ORDER — HYDROMORPHONE HCL 1 MG/ML IJ SOLN
0.2500 mg | INTRAMUSCULAR | Status: DC | PRN
  Administered 2024-02-21 (×3): 0.5 mg via INTRAVENOUS

## 2024-02-21 MED ORDER — MIDAZOLAM HCL 2 MG/2ML IJ SOLN
INTRAMUSCULAR | Status: AC
  Filled 2024-02-21: qty 2

## 2024-02-21 MED ORDER — ONDANSETRON HCL 4 MG/2ML IJ SOLN
INTRAMUSCULAR | Status: AC
  Filled 2024-02-21: qty 2

## 2024-02-21 MED ORDER — OXYCODONE HCL 5 MG/5ML PO SOLN: 5.0000 mg | Freq: Once | ORAL | Status: DC | PRN

## 2024-02-21 MED ORDER — KETOROLAC TROMETHAMINE 30 MG/ML IJ SOLN
30.0000 mg | Freq: Once | INTRAMUSCULAR | Status: AC | PRN
  Administered 2024-02-21: 30 mg via INTRAVENOUS

## 2024-02-21 MED ORDER — CHLORHEXIDINE GLUCONATE CLOTH 2 % EX PADS: 6.0000 | MEDICATED_PAD | Freq: Once | CUTANEOUS | Status: DC

## 2024-02-21 MED ORDER — OXYCODONE HCL 5 MG PO TABS: 5.0000 mg | ORAL_TABLET | Freq: Once | ORAL | Status: DC | PRN

## 2024-02-21 MED ORDER — ROCURONIUM BROMIDE 10 MG/ML (PF) SYRINGE
PREFILLED_SYRINGE | INTRAVENOUS | Status: DC | PRN
  Administered 2024-02-21: 80 mg via INTRAVENOUS
  Administered 2024-02-21: 10 mg via INTRAVENOUS

## 2024-02-21 MED ORDER — HYDROMORPHONE HCL 1 MG/ML IJ SOLN
INTRAMUSCULAR | Status: DC | PRN
  Administered 2024-02-21: .5 mg via INTRAVENOUS

## 2024-02-21 MED ORDER — IPRATROPIUM-ALBUTEROL 0.5-2.5 (3) MG/3ML IN SOLN
RESPIRATORY_TRACT | Status: AC
Start: 2024-02-21 — End: 2024-02-21
  Filled 2024-02-21: qty 3

## 2024-02-21 MED ORDER — OXYCODONE-ACETAMINOPHEN 5-325 MG PO TABS: 1.0000 | ORAL_TABLET | ORAL | 0 refills | Status: AC | PRN

## 2024-02-21 MED ORDER — FENTANYL CITRATE (PF) 250 MCG/5ML IJ SOLN
INTRAMUSCULAR | Status: DC | PRN
  Administered 2024-02-21 (×2): 50 ug via INTRAVENOUS
  Administered 2024-02-21: 150 ug via INTRAVENOUS

## 2024-02-21 MED ORDER — PROPOFOL 10 MG/ML IV BOLUS
INTRAVENOUS | Status: AC
  Filled 2024-02-21: qty 20

## 2024-02-21 MED ORDER — BUPIVACAINE-EPINEPHRINE (PF) 0.25% -1:200000 IJ SOLN
INTRAMUSCULAR | Status: AC
  Filled 2024-02-21: qty 30

## 2024-02-21 MED ORDER — SODIUM CHLORIDE 0.9% IV SOLUTION: Freq: Once | INTRAVENOUS | Status: DC

## 2024-02-21 MED ORDER — OXYCODONE-ACETAMINOPHEN 5-325 MG PO TABS: 1.0000 | ORAL_TABLET | ORAL | 0 refills | Status: DC | PRN

## 2024-02-21 MED ORDER — SODIUM CHLORIDE 0.9 % IR SOLN
Status: DC | PRN
  Administered 2024-02-21: 1000 mL

## 2024-02-21 MED ORDER — LIDOCAINE 2% (20 MG/ML) 5 ML SYRINGE
INTRAMUSCULAR | Status: DC | PRN
  Administered 2024-02-21: 80 mg via INTRAVENOUS

## 2024-02-21 MED ORDER — DEXAMETHASONE SODIUM PHOSPHATE 10 MG/ML IJ SOLN
INTRAMUSCULAR | Status: AC
  Filled 2024-02-21: qty 1

## 2024-02-21 SURGICAL SUPPLY — 44 items
BAG COUNTER SPONGE SURGICOUNT (BAG) ×2 IMPLANT
CANISTER SUCTION 3000ML PPV (SUCTIONS) ×2 IMPLANT
CATH URETL OPEN 5X70 (CATHETERS) ×2 IMPLANT
CATH URETL OPEN END 6FR 70 (CATHETERS) IMPLANT
CHLORAPREP W/TINT 26 (MISCELLANEOUS) ×2 IMPLANT
CLIP APPLIE ROT 10 11.4 M/L (STAPLE) ×2 IMPLANT
COVER MAYO STAND STRL (DRAPES) IMPLANT
COVER SURGICAL LIGHT HANDLE (MISCELLANEOUS) ×2 IMPLANT
DERMABOND ADVANCED .7 DNX12 (GAUZE/BANDAGES/DRESSINGS) ×2 IMPLANT
DRAPE C-ARM 42X120 X-RAY (DRAPES) ×2 IMPLANT
ELECTRODE REM PT RTRN 9FT ADLT (ELECTROSURGICAL) ×2 IMPLANT
ENDOLOOP SUT PDS II 0 18 (SUTURE) IMPLANT
GLOVE BIO SURGEON STRL SZ7.5 (GLOVE) ×2 IMPLANT
GLOVE BIOGEL PI IND STRL 8 (GLOVE) ×2 IMPLANT
GOWN STRL REUS W/ TWL LRG LVL3 (GOWN DISPOSABLE) ×4 IMPLANT
GOWN STRL REUS W/ TWL XL LVL3 (GOWN DISPOSABLE) ×2 IMPLANT
GRASPER SUT TROCAR 14GX15 (MISCELLANEOUS) ×2 IMPLANT
IRRIGATION SUCT STRKRFLW 2 WTP (MISCELLANEOUS) ×2 IMPLANT
KIT BASIN OR (CUSTOM PROCEDURE TRAY) ×2 IMPLANT
KIT IMAGING PINPOINTPAQ (MISCELLANEOUS) IMPLANT
KIT TURNOVER KIT B (KITS) ×2 IMPLANT
NDL 22X1.5 STRL (OR ONLY) (MISCELLANEOUS) ×2 IMPLANT
NDL INSUFFLATION 14GA 120MM (NEEDLE) ×2 IMPLANT
NEEDLE 22X1.5 STRL (OR ONLY) (MISCELLANEOUS) ×1 IMPLANT
NEEDLE INSUFFLATION 14GA 120MM (NEEDLE) ×1 IMPLANT
PAD ARMBOARD POSITIONER FOAM (MISCELLANEOUS) ×2 IMPLANT
POUCH RETRIEVAL ECOSAC 10 (ENDOMECHANICALS) ×2 IMPLANT
SCISSORS LAP 5X35 DISP (ENDOMECHANICALS) ×2 IMPLANT
SET CHOLANGIOGRAPH 5 50 .035 (SET/KITS/TRAYS/PACK) IMPLANT
SET TUBE SMOKE EVAC HIGH FLOW (TUBING) ×2 IMPLANT
SLEEVE Z-THREAD 5X100MM (TROCAR) ×4 IMPLANT
SOLN 0.9% NACL 1000 ML (IV SOLUTION) ×1 IMPLANT
SOLN 0.9% NACL POUR BTL 1000ML (IV SOLUTION) ×2 IMPLANT
SOLN STERILE WATER 1000 ML (IV SOLUTION) ×1 IMPLANT
SOLN STERILE WATER BTL 1000 ML (IV SOLUTION) ×2 IMPLANT
SPECIMEN JAR SMALL (MISCELLANEOUS) ×2 IMPLANT
STOPCOCK 4 WAY LG BORE MALE ST (IV SETS) ×2 IMPLANT
SUT MNCRL AB 4-0 PS2 18 (SUTURE) ×2 IMPLANT
TOWEL GREEN STERILE (TOWEL DISPOSABLE) ×2 IMPLANT
TOWEL GREEN STERILE FF (TOWEL DISPOSABLE) ×2 IMPLANT
TRAY LAPAROSCOPIC MC (CUSTOM PROCEDURE TRAY) ×2 IMPLANT
TROCAR 11X100 Z THREAD (TROCAR) ×2 IMPLANT
TROCAR Z-THREAD OPTICAL 5X100M (TROCAR) ×2 IMPLANT
WARMER LAPAROSCOPE (MISCELLANEOUS) ×2 IMPLANT

## 2024-02-21 NOTE — Anesthesia Procedure Notes (Signed)
 Procedure Name: Intubation Date/Time: 02/21/2024 4:53 PM  Performed by: Emmitt Millman, CRNAPre-anesthesia Checklist: Patient identified, Emergency Drugs available, Suction available and Patient being monitored Patient Re-evaluated:Patient Re-evaluated prior to induction Oxygen Delivery Method: Circle system utilized Preoxygenation: Pre-oxygenation with 100% oxygen Induction Type: IV induction Ventilation: Mask ventilation without difficulty Laryngoscope Size: Glidescope and 3 Grade View: Grade I Tube type: Oral Tube size: 7.0 mm Number of attempts: 1 Airway Equipment and Method: Stylet Placement Confirmation: ETT inserted through vocal cords under direct vision, positive ETCO2 and breath sounds checked- equal and bilateral Secured at: 22 cm Tube secured with: Tape Dental Injury: Teeth and Oropharynx as per pre-operative assessment  Comments: Elective glidescope intubation 2/2 scheduled ACDF in 4 weeks.

## 2024-02-21 NOTE — Discharge Instructions (Signed)
 CHOLECYSTECTOMY POST OPERATIVE INSTRUCTIONS  Thinking Clearly  The anesthesia may cause you to feel different for 1 or 2 days. Do not drive, drink alcohol, or make any big decisions for at least 2 days.  Nutrition When you wake up, you will be able to drink small amounts of liquid. If you do not feel sick, you can slowly advance your diet to regular foods. Continue to drink lots of fluids, usually about 8 to 10 glasses per day. Eat a high-fiber diet so you don't strain during bowel movements. High-Fiber Foods Foods high in fiber include beans, bran cereals and whole-grain breads, peas, dried fruit (figs, apricots, and dates), raspberries, blackberries, strawberries, sweet corn, broccoli, baked potatoes with skin, plums, pears, apples, greens, and nuts. Activity Slowly increase your activity. Be sure to get up and walk every hour or so to prevent blood clots. No heavy lifting or strenuous activity for 4 weeks following surgery to prevent hernias at your incision sites It is normal to feel tired. You may need more sleep than usual.  Get your rest but make sure to get up and move around frequently to prevent blood clots and pneumonia.  Work and Return to [employer] can go back to work when you feel well enough. Discuss the timing with your surgeon. You can usually go back to school or work 1 week after an operation. If your work requires heavy lifting or strenuous activity you need to be placed on light duty for 4 weeks following surgery. You can return to gym class, sports or other physical activities 4 weeks after surgery.  Wound Care Always wash your hands before and after touching near your incision site. Do not soak in a bathtub until cleared at your follow up appointment. You may take a shower 24 hours after surgery. A small amount of drainage from the incision is normal. If the drainage is thick and yellow or the site is red, you may have an infection, so call your surgeon. If you  have a drain in one of your incisions, it will be taken out in office when the drainage stops. Steri-Strips will fall off in 7 to 10 days or they will be removed during your first office visit. If you have dermabond glue covering over the incision, allow the glue to flake off on its own. Avoid wearing tight or rough clothing. It may rub your incisions and make it harder for them to heal. Protect the new skin, especially from the sun. The sun can burn and cause darker scarring. Your scar will heal in about 4 to 6 weeks and will become softer and continue to fade over the next year.  The cosmetic appearance of the incisions will improve over the course of the first year after surgery. Sensation around your incision will return in a few weeks or months.  Bowel Movements After intestinal surgery, you may have loose watery stools for several days. If watery diarrhea lasts longer than 3 days, contact your surgeon. Pain medication (narcotics) can cause constipation. Increase the fiber in your diet with high-fiber foods if you are constipated. You can take an over the counter stool softener like Colace to avoid constipation.  Additional over the counter medications can also be used if Colace isn't sufficient (for example, Milk of Magnesia or Miralax).  Pain The amount of pain is different for each person. Some people need only 1 to 3 doses of pain control medication, while others need more. Take alternating doses of tylenol   and ibuprofen around the clock for the first five days following surgery.  This will provide a baseline of pain control and help with inflammation.  Take the narcotic pain medication in addition if needed for severe pain.  Contact Your Surgeon at [phone], if you have: Pain in your right upper abdomen like a gallbladder attack. Pain that will not go away Pain that gets worse A fever of more than 101F (38.3C) Repeated vomiting Swelling, redness, bleeding, or bad-smelling  drainage from your wound site Strong abdominal pain No bowel movement or unable to pass gas for 3 days Watery diarrhea lasting longer than 3 days  Pain Control The goal of pain control is to minimize pain, keep you moving and help you heal. Your surgical team will work with you on your pain plan. Most often a combination of therapies and medications are used to control your pain. You may also be given medication (local anesthetic) at the surgical site. This may help control your pain for several days. Extreme pain puts extra stress on your body at a time when your body needs to focus on healing. Do not wait until your pain has reached a level "10" or is unbearable before telling your doctor or nurse. It is much easier to control pain before it becomes severe. Following a laparoscopic procedure, pain is sometimes felt in the shoulder. This is due to the gas inserted into your abdomen during the procedure. Moving and walking helps to decrease the gas and the right shoulder pain.  Use the guide below for ways to manage your post-operative pain. Learn more by going to facs.org/safepaincontrol.  How Intense Is My Pain Common Therapies to Feel Better       I hardly notice my pain, and it does not interfere with my activities.  I notice my pain and it distracts me, but I can still do activities (sitting up, walking, standing).  Non-Medication Therapies  Ice (in a bag, applied over clothing at the surgical site), elevation, rest, meditation, massage, distraction (music, TV, play) walking and mild exercise Splinting the abdomen with pillows +  Non-Opioid Medications Acetaminophen  (Tylenol ) Non-steroidal anti-inflammatory drugs (NSAIDS) Aspirin, Ibuprofen (Motrin, Advil) Naproxen (Aleve) Take these as needed, when you feel pain. Both acetaminophen  and NSAIDs help to decrease pain and swelling (inflammation).      My pain is hard to ignore and is more noticeable even when I rest.  My  pain interferes with my usual activities.  Non-Medication Therapies  +  Non-Opioid medications  Take on a regular schedule (around-the-clock) instead of as needed. (For example, Tylenol  every 6 hours at 9:00 am, 3:00 pm, 9:00 pm, 3:00 am and Motrin every 6 hours at 12:00 am, 6:00 am, 12:00 pm, 6:00 pm)         I am focused on my pain, and I am not doing my daily activities.  I am groaning in pain, and I cannot sleep. I am unable to do anything.  My pain is as bad as it could be, and nothing else matters.  Non-Medication Therapies  +  Around-the-Clock Non-Opioid Medications  +  Short-acting opioids  Opioids should be used with other medications to manage severe pain. Opioids block pain and give a feeling of euphoria (feel high). Addiction, a serious side effect of opioids, is rare with short-term (a few days) use.  Examples of short-acting opioids include: Tramadol  (Ultram ), Hydrocodone  (Norco, Vicodin), Hydromorphone  (Dilaudid ), Oxycodone  (Oxycontin )     The above directions have been adapted from  the [employer] of [employer] Surgical Patient Education Program.  Please refer to the ACS website if needed: [email or web address].   Deward Foy, MD [facility] [address], [place], [place]  [address] ?  [address] [address], [place], [place]   [address] [phone] ? [phone] ? FAX [phone] Web site: [email or web address]

## 2024-02-21 NOTE — Transfer of Care (Signed)
 Immediate Anesthesia Transfer of Care Note  Patient: Kendra Parker  Procedure(s) Performed: LAPAROSCOPIC CHOLECYSTECTOMY WITH INTRAOPERATIVE CHOLANGIOGRAM (Abdomen)  Patient Location: PACU  Anesthesia Type:General  Level of Consciousness: awake, alert , and oriented  Airway & Oxygen Therapy: Patient Spontanous Breathing and Patient connected to nasal cannula oxygen  Post-op Assessment: Report given to RN and Post -op Vital signs reviewed and stable  Post vital signs: Reviewed and stable  Last Vitals:  Vitals Value Taken Time  BP 142/82 02/21/24 18:02  Temp    Pulse 102 02/21/24 18:05  Resp 21 02/21/24 18:05  SpO2 93 % 02/21/24 18:05  Vitals shown include unfiled device data.  Last Pain:  Vitals:   02/21/24 1318  TempSrc:   PainSc: 9       Patients Stated Pain Goal: 3 (02/21/24 1258)  Complications: No notable events documented.

## 2024-02-21 NOTE — Anesthesia Postprocedure Evaluation (Signed)
 Anesthesia Post Note  Patient: Kendra Parker  Procedure(s) Performed: LAPAROSCOPIC CHOLECYSTECTOMY WITH INTRAOPERATIVE CHOLANGIOGRAM (Abdomen)     Patient location during evaluation: PACU Anesthesia Type: General Level of consciousness: awake and alert and oriented Pain management: pain level controlled Vital Signs Assessment: post-procedure vital signs reviewed and stable Respiratory status: spontaneous breathing, nonlabored ventilation and respiratory function stable Cardiovascular status: blood pressure returned to baseline and stable Postop Assessment: no apparent nausea or vomiting Anesthetic complications: no   No notable events documented.  Last Vitals:  Vitals:   02/21/24 1900 02/21/24 1915  BP: 139/77 (!) 147/118  Pulse: 95 (!) 106  Resp: 14 18  Temp:    SpO2: 100% 96%    Last Pain:  Vitals:   02/21/24 1835  TempSrc:   PainSc: 6                  [name].

## 2024-02-21 NOTE — H&P (Signed)
 Admitting Physician: Deward PARAS Vincenzina Jagoda  Service: General Surgery  CC: Pancreatitis  Subjective   HPI: Kendra Parker is an 53 y.o. female who is here for laparoscopic cholecystectomy after episode of severe pancreatitis.  Past Medical History:  Diagnosis Date   Anemia    Anxiety    Aortic atherosclerosis    Blurred vision, bilateral    COPD (chronic obstructive pulmonary disease) (HCC)    mild   Eating disorder    Hand numbness    Hearing loss    Heart murmur    Hepatic steatosis    HLD (hyperlipidemia)    Hypertension    Hypothyroidism    Low back pain    Migraine    Numbness    Left leg   Obesity    Occipital neuralgia of left side    Pancreatitis    Paraseptal emphysema (HCC)    Pelvic pain    Perforation of both tympanic membranes    Pneumonia    Psychophysiological insomnia    Referred otalgia of left ear    Sinusitis     Past Surgical History:  Procedure Laterality Date   ABDOMINAL HYSTERECTOMY     Partial   DENTAL SURGERY     EAR MEATOPLASTY WITH FULL THICKNESS SKIN GRAFT Bilateral    multiple ear surgies   INNER EAR SURGERY     LAPAROSCOPIC OVARIAN CYSTECTOMY     Left   TONSILLECTOMY     WISDOM TOOTH EXTRACTION      Family History  Problem Relation Age of Onset   Diabetes Mother    Multiple myeloma Mother    Breast cancer Mother    Seizures Father    Diabetes Maternal Grandmother    Cataracts Maternal Grandmother    Cataracts Paternal Grandmother    Arthritis Paternal Grandmother    Cataracts Paternal Grandfather    Arthritis Paternal Grandfather    Stroke Paternal Grandfather    Cancer Maternal Aunt    Cancer Maternal Uncle     Social:  reports that she has quit smoking. Her smoking use included cigarettes and e-cigarettes. She has never used smokeless tobacco. She reports that she does not currently use drugs. She reports that she does not drink alcohol.  Allergies:  Allergies  Allergen Reactions   Lamotrigine Rash    Only  at 100 mg is intolerable-gets a rash from head to toe   Topiramate Other (See Comments)    Visual problems   Penicillins Rash    Medications: Current Outpatient Medications  Medication Instructions   albuterol  (VENTOLIN  HFA) 108 (90 Base) MCG/ACT inhaler inhale 2 puffs into the lungs by mouth every 4 hours as needed for wheezing   atorvastatin  (LIPITOR) 20 mg, Every morning   budesonide-formoterol (SYMBICORT) 160-4.5 MCG/ACT inhaler 2 puffs, 2 times daily   carbamide peroxide (DEBROX) 6.5 % OTIC solution 4 drops, 2 times daily   celecoxib (CELEBREX) 200 mg, Daily at bedtime   ciprofloxacin-dexamethasone (CIPRODEX) OTIC suspension 4 drops, 2 times daily   clonazePAM  (KLONOPIN ) 1 mg, 2 times daily   Cyanocobalamin (VITAMIN B-12 PO) 1 capsule, Every morning   diphenhydramine -acetaminophen  (TYLENOL  PM) 25-500 MG TABS tablet 2 tablets, Daily at bedtime   DULoxetine  (CYMBALTA ) 30 mg, Daily at bedtime   DULoxetine  (CYMBALTA ) 60 mg, Daily at bedtime   Eptinezumab-jjmr (VYEPTI IV) 300 mg, Every 3 months   FIBER PO 3 tablets, 2 times daily   levETIRAcetam  (KEPPRA ) 500 mg, 2 times daily   levothyroxine  (SYNTHROID ) 25  mcg, Daily before breakfast   methocarbamol  (ROBAXIN ) 750 MG tablet Take every 8 hours to treat acute migraines. #30 allowed per month.   metoprolol  tartrate (LOPRESSOR ) 50 mg, 2 times daily   Multiple Vitamins-Minerals (MULTIVITAMIN WITH MINERALS) tablet 1 tablet, Every morning   Nurtec 75 mg, Every other day   pantoprazole  (PROTONIX ) 40 mg, Oral, 2 times daily before meals   pregabalin  (LYRICA ) 25 mg, Oral, 3 times daily   QUEtiapine  (SEROQUEL ) 200 mg, Daily at bedtime   [Paused] rizatriptan  (MAXALT -MLT) 10 mg, As needed   spironolactone (ALDACTONE) 25 mg, Every morning   Ubrelvy  100 mg, Daily PRN   venlafaxine  XR (EFFEXOR  XR) 37.5 mg, Oral, Daily with breakfast   Vitamin D3 2,000 Units, Every morning    ROS - all of the below systems have been reviewed with the patient and  positives are indicated with bold text General: chills, fever or night sweats Eyes: blurry vision or double vision ENT: epistaxis or sore throat Allergy/Immunology: itchy/watery eyes or nasal congestion Hematologic/Lymphatic: bleeding problems, blood clots or swollen lymph nodes Endocrine: temperature intolerance or unexpected weight changes Breast: new or changing breast lumps or nipple discharge Resp: cough, shortness of breath, or wheezing CV: chest pain or dyspnea on exertion GI: as per HPI GU: dysuria, trouble voiding, or hematuria MSK: joint pain or joint stiffness Neuro: TIA or stroke symptoms Derm: pruritus and skin lesion changes Psych: anxiety and depression  Objective   PE Height 5' 3 (1.6 m), weight 111.1 kg. Constitutional: NAD; conversant; no deformities Eyes: Moist conjunctiva; no lid lag; anicteric; PERRL Neck: Trachea midline; no thyromegaly Lungs: Normal respiratory effort; no tactile fremitus CV: RRR; no palpable thrills; no pitting edema GI: Abd Soft, nontender; no palpable hepatosplenomegaly MSK: Normal range of motion of extremities; no clubbing/cyanosis Psychiatric: Appropriate affect; alert and oriented x3 Lymphatic: No palpable cervical or axillary lymphadenopathy  No results found for this or any previous visit (from the past 24 hours).  Imaging Orders  No imaging studies ordered today     Assessment and Plan   Kendra Parker is an 53 y.o. female with a history of severe pancreatitis here for laparoscopic cholecystectomy with intraoperative cholangiogram.  We discussed the procedure, its risks, benefits and alternatives and the patient granted consent to proceed.  Deward JINNY Foy, MD  [facility] Use [email or web address] to contact on call provider

## 2024-02-21 NOTE — Op Note (Signed)
 Patient: Kendra Parker (1971/03/16, [id number])  Date of Surgery: 02/21/2024  Preoperative Diagnosis: GALLSTONE PANCREATITIS   Postoperative Diagnosis: GALLSTONE PANCREATITIS   Surgical Procedure: LAPAROSCOPIC CHOLECYSTECTOMY WITH INTRAOPERATIVE CHOLANGIOGRAM: 52436 (CPT)    Operative Team Members:  Surgeons and Role:    * Astin Rape, Deward PARAS, MD - Primary   Anesthesiologist: Jerrye Sharper, MD CRNA: Emmitt Millman, CRNA   Anesthesia: General   Fluids:  Total I/O In: 100 [IV Piggyback:100] Out: 5 [Blood:5]  Complications: * No complications entered in OR log *  Drains:  none   Specimen:  ID Type Source Tests Collected by Time Destination  1 : Gallbladder Tissue PATH Gallbladder SURGICAL PATHOLOGY Shamere Dilworth, Deward PARAS, MD 02/21/2024 1712      Disposition:  PACU - hemodynamically stable.  Plan of Care: Discharge to home after PACU    Indications for Procedure: [name] is a 53 y.o. female who presented with a history of gallstone pancreatitis.  Laparoscopic cholecystectomy was recommended for the patient.  The procedure itself, as well as the risks, benefits and alternatives were discussed with the patient.  Risks discussed included but were not limited to the risk of infection, bleeding, damage to nearby structures, need to convert to open procedure, incisional hernia, bile leak, common bile duct injury and the need for additional procedures or surgeries.  With this discussion complete and all questions answered the patient granted consent to proceed.  Findings: large gallbladder  Infection status: Patient: Private Patient Elective Case Case: Elective Infection Present At Time Of Surgery (PATOS): Some contamination related to performing cholangiogram   Description of Procedure:   On the date stated above, the patient was taken to the operating room suite and placed in supine positioning.  Sequential compression devices were placed on the lower extremities to  prevent blood clots.  General endotracheal anesthesia was induced. Preoperative antibiotics were given.  The patient's abdomen was prepped and draped in the usual sterile fashion.  A time-out was completed verifying the correct patient, procedure, positioning and equipment needed for the case.  We began by anesthetizing the skin with local anesthetic and then making a 5 mm incision just below the umbilicus.  We dissected through the subcutaneous tissues to the fascia.  The fascia was grasped and elevated using a Kocher clamp.  A Veress needle was inserted into the abdomen and the abdomen was insufflated to 15 mmHg.  A 5 mm trocar was inserted in this position under optical guidance and then the abdomen was inspected.  There was no trauma to the underlying viscera with initial trocar placement.  Any abnormal findings, other than inflammation in the right upper quadrant, are listed above in the findings section.  Three additional trocars were placed, one 12 mm trocar in the subxiphoid position, one 5 mm trocar in the midline epigastric area and one 5mm trocar in the right upper quadrant subcostally.  These were placed under direct vision without any trauma to the underlying viscera.    The patient was then placed in head up, left side down positioning.  The gallbladder was identified and dissected free from its attachments to the omentum allowing the duodenum to fall away.  The infundibulum of the gallbladder was dissected free working laterally to medially.  The cystic duct and cystic artery were dissected free from surrounding connective tissue.  The infundibulum of the gallbladder was dissected off the cystic plate.  A critical view of safety was obtained with the cystic duct and cystic artery being cleared  of connective tissues and clearly the only two structures entering into the gallbladder with the liver clearly visible behind.  One clip was applied high on the cystic duct.  A small ductotomy was created  below this using the endoscopic shears.  A cholangiogram catheter was introduced through the abdominal wall and into the cystic duct through this ductotomy.  The catheter was clipped into position.  The catheter was flushed to ensure no leakage around the clip.  We then removed the laparoscopic instruments and positioned the C-Arm to perform a cholangiogram.  The catheter was flushed with contrast under fluoroscopic visualization and a cholangiogram was obtained.  The cholangiogram visualized the biliary tree from the ampulla up to the first two biliary radicals in the liver.  There were no filling defects identified.  The catheter clearly entered the cystic duct.  There was gradual tapering of the common bile duct down to the ampulla without evidence of stricture or other abnormalities.  Please see the EMR for saved representative images.  With our cholangiogram compete, we moved the c-arm away from the field and returned to laparoscopic surgery.    Clips were then applied to the cystic duct and cystic artery and then these structures were divided.  A PDS endoloop was placed to secure the cystic duct.  The gallbladder was dissected off the cystic plate, placed in an endocatch bag and removed from the 12 mm subxiphoid port site.  The clips were inspected and appeared effective.  The cystic plate was inspected and hemostasis was obtained using electrocautery.  A suction irrigator was used to clean the operative field.  Attention was turned to closure.  The 12 mm subxiphoid port site was closed using a 0-vicryl suture on a fascial suture passer.  The abdomen was desufflated.  The skin was closed using 4-0 monocryl and dermabond.  All sponge and needle counts were correct at the conclusion of the case.    Deward Foy, MD General, Bariatric, & Minimally Invasive Surgery Physician [facility], [place]

## 2024-02-22 ENCOUNTER — Other Ambulatory Visit (HOSPITAL_COMMUNITY)

## 2024-02-22 ENCOUNTER — Encounter (HOSPITAL_COMMUNITY): Payer: Self-pay | Admitting: Surgery

## 2024-02-23 ENCOUNTER — Encounter: Payer: Self-pay | Admitting: Cardiovascular Disease

## 2024-02-24 LAB — SURGICAL PATHOLOGY

## 2024-02-24 NOTE — Progress Notes (Signed)
 Pharmacist Arletha originally was scheduled with [facility] nursing for 10/06 the nurse had an opening and was able to see patient today.  I've sent a message to [facility] to update service date.

## 2024-03-12 ENCOUNTER — Other Ambulatory Visit: Payer: Self-pay

## 2024-03-12 ENCOUNTER — Encounter (HOSPITAL_COMMUNITY): Payer: Self-pay

## 2024-03-12 ENCOUNTER — Encounter (HOSPITAL_COMMUNITY)
Admission: RE | Admit: 2024-03-12 | Discharge: 2024-03-12 | Disposition: A | Source: Ambulatory Visit | Attending: Neurosurgery

## 2024-03-12 VITALS — BP 156/83 | HR 75 | Temp 98.0°F | Resp 18 | Ht 63.0 in | Wt 246.0 lb

## 2024-03-12 DIAGNOSIS — M47812 Spondylosis without myelopathy or radiculopathy, cervical region: Secondary | ICD-10-CM | POA: Diagnosis not present

## 2024-03-12 DIAGNOSIS — E785 Hyperlipidemia, unspecified: Secondary | ICD-10-CM | POA: Insufficient documentation

## 2024-03-12 DIAGNOSIS — Z87891 Personal history of nicotine dependence: Secondary | ICD-10-CM | POA: Diagnosis not present

## 2024-03-12 DIAGNOSIS — J449 Chronic obstructive pulmonary disease, unspecified: Secondary | ICD-10-CM | POA: Insufficient documentation

## 2024-03-12 DIAGNOSIS — Z01818 Encounter for other preprocedural examination: Secondary | ICD-10-CM

## 2024-03-12 DIAGNOSIS — G4733 Obstructive sleep apnea (adult) (pediatric): Secondary | ICD-10-CM | POA: Insufficient documentation

## 2024-03-12 DIAGNOSIS — I10 Essential (primary) hypertension: Secondary | ICD-10-CM | POA: Diagnosis not present

## 2024-03-12 DIAGNOSIS — K760 Fatty (change of) liver, not elsewhere classified: Secondary | ICD-10-CM | POA: Insufficient documentation

## 2024-03-12 DIAGNOSIS — E039 Hypothyroidism, unspecified: Secondary | ICD-10-CM | POA: Insufficient documentation

## 2024-03-12 DIAGNOSIS — Z01812 Encounter for preprocedural laboratory examination: Secondary | ICD-10-CM | POA: Diagnosis present

## 2024-03-12 DIAGNOSIS — M47892 Other spondylosis, cervical region: Secondary | ICD-10-CM | POA: Insufficient documentation

## 2024-03-12 LAB — CBC
HCT: 43.9 % (ref 36.0–46.0)
Hemoglobin: 13.8 g/dL (ref 12.0–15.0)
MCH: 29.7 pg (ref 26.0–34.0)
MCHC: 31.4 g/dL (ref 30.0–36.0)
MCV: 94.4 fL (ref 80.0–100.0)
Platelets: 407 K/uL — ABNORMAL HIGH (ref 150–400)
RBC: 4.65 MIL/uL (ref 3.87–5.11)
RDW: 14.8 % (ref 11.5–15.5)
WBC: 8.9 K/uL (ref 4.0–10.5)
nRBC: 0 % (ref 0.0–0.2)

## 2024-03-12 LAB — BASIC METABOLIC PANEL WITH GFR
Anion gap: 13 (ref 5–15)
BUN: 22 mg/dL — ABNORMAL HIGH (ref 6–20)
CO2: 26 mmol/L (ref 22–32)
Calcium: 9.5 mg/dL (ref 8.9–10.3)
Chloride: 100 mmol/L (ref 98–111)
Creatinine, Ser: 0.72 mg/dL (ref 0.44–1.00)
GFR, Estimated: >60 mL/min (ref >60)
Glucose, Bld: 97 mg/dL (ref 70–99)
Potassium: 4.3 mmol/L (ref 3.5–5.1)
Sodium: 139 mmol/L (ref 135–145)

## 2024-03-12 LAB — TYPE AND SCREEN
ABO/RH(D): AB NEG
Antibody Screen: NEGATIVE

## 2024-03-12 LAB — SURGICAL PCR SCREEN
MRSA, PCR: NEGATIVE
Staphylococcus aureus: NEGATIVE

## 2024-03-12 NOTE — Progress Notes (Signed)
 Surgical Instructions   Your procedure is scheduled on Thursday October 30. Report to [facility] Main Entrance A at 11:00 A.M., then check in with the Admitting office. Any questions or running late day of surgery: call [phone]  Questions prior to your surgery date: call [phone], Monday-Friday, 8am-4pm. If you experience any cold or flu symptoms such as cough, fever, chills, shortness of breath, etc. between now and your scheduled surgery, please notify us  at the above number.     Remember:  Do not eat or drink anything after midnight the night before your surgery    Take these medicines the morning of surgery with A SIP OF WATER  atorvastatin  (LIPITOR)  budesonide-formoterol (SYMBICORT) 160-4.5 MCG/ACT  carbamide peroxide (DEBROX) 6.5 % OTIC solution  clonazePAM  (KLONOPIN )  levETIRAcetam  (KEPPRA )  levothyroxine  (SYNTHROID )  methocarbamol  (ROBAXIN )  metoprolol  (LOPRESSOR )  pantoprazole  (PROTONIX )  pregabalin  (LYRICA )  venlafaxine  XR (EFFEXOR  XR)   May take these medicines IF NEEDED: albuterol  (VENTOLIN  HFA) -please bring inhaler to the hospital. Ubrogepant  (UBRELVY )   One week prior to surgery, STOP taking any Aspirin (unless otherwise instructed by your surgeon) Aleve , Naproxen , Ibuprofen, Motrin, Advil, Goody's, BC's, all herbal medications, fish oil, and non-prescription vitamins. This includes your celecoxib (CELEBREX) .                     Do NOT Smoke (Tobacco/Vaping) for 24 hours prior to your procedure.  If you use a CPAP at night, you may bring your mask/headgear for your overnight stay.   You will be asked to remove any contacts, glasses, piercing's, hearing aid's, dentures/partials prior to surgery. Please bring cases for these items if needed.    Patients discharged the day of surgery will not be allowed to drive home, and someone needs to stay with them for 24 hours.  SURGICAL WAITING ROOM VISITATION Patients may have no more than 2 support people  in the waiting area - these visitors may rotate.   Pre-op nurse will coordinate an appropriate time for 1 ADULT support person, who may not rotate, to accompany patient in pre-op.  Children under the age of 19 must have an adult with them who is not the patient and must remain in the main waiting area with an adult.  If the patient needs to stay at the hospital during part of their recovery, the visitor guidelines for inpatient rooms apply.  Please refer to the [facility] website for the visitor guidelines for any additional information.   If you received a COVID test during your pre-op visit  it is requested that you wear a mask when out in public, stay away from anyone that may not be feeling well and notify your surgeon if you develop symptoms. If you have been in contact with anyone that has tested positive in the last 10 days please notify you surgeon.      Pre-operative 4 CHG Bathing Instructions   You can play a key role in reducing the risk of infection after surgery. Your skin needs to be as free of germs as possible. You can reduce the number of germs on your skin by washing with CHG (chlorhexidine gluconate) soap before surgery. CHG is an antiseptic soap that kills germs and continues to kill germs even after washing.   DO NOT use if you have an allergy to chlorhexidine/CHG or antibacterial soaps. If your skin becomes reddened or irritated, stop using the CHG and notify one of our RNs at [phone].   Please  shower with the CHG soap starting 4 days before surgery using the following schedule:     Please keep in mind the following:  DO NOT shave, including legs and underarms, starting the day of your first shower.   You may shave your face at any point before/day of surgery.  Place clean sheets on your bed the day you start using CHG soap. Use a clean washcloth (not used since being washed) for each shower. DO NOT sleep with pets once you start using the CHG.   CHG Shower  Instructions:  Wash your face and private area with normal soap. If you choose to wash your hair, wash first with your normal shampoo.  After you use shampoo/soap, rinse your hair and body thoroughly to remove shampoo/soap residue.  Turn the water OFF and apply  bottle of CHG soap to a CLEAN washcloth.  Apply CHG soap ONLY FROM YOUR NECK DOWN TO YOUR TOES (washing for 3-5 minutes)  DO NOT use CHG soap on face, private areas, open wounds, or sores.  Pay special attention to the area where your surgery is being performed.  If you are having back surgery, having someone wash your back for you may be helpful. Wait 2 minutes after CHG soap is applied, then you may rinse off the CHG soap.  Pat dry with a clean towel  Put on clean clothes/pajamas   If you choose to wear lotion, please use ONLY the CHG-compatible lotions that are listed below.  Additional instructions for the day of surgery:  If you choose, you may shower the morning of surgery with an antibacterial soap.  DO NOT APPLY any lotions, deodorants, cologne, or perfumes.   Do not bring valuables to the hospital. [facility] is not responsible for any belongings/valuables. Do not wear nail polish, gel polish, artificial nails, or any other type of covering on natural nails (fingers and toes) Do not wear jewelry or makeup Put on clean/comfortable clothes.  Please brush your teeth.  Ask your nurse before applying any prescription medications to the skin.     CHG Compatible Lotions   Aveeno Moisturizing lotion  Cetaphil Moisturizing Cream  Cetaphil Moisturizing Lotion  Clairol Herbal Essence Moisturizing Lotion, Dry Skin  Clairol Herbal Essence Moisturizing Lotion, Extra Dry Skin  Clairol Herbal Essence Moisturizing Lotion, Normal Skin  Curel Age Defying Therapeutic Moisturizing Lotion with Alpha Hydroxy  Curel Extreme Care Body Lotion  Curel Soothing Hands Moisturizing Hand Lotion  Curel Therapeutic Moisturizing Cream,  Fragrance-Free  Curel Therapeutic Moisturizing Lotion, Fragrance-Free  Curel Therapeutic Moisturizing Lotion, Original Formula  Eucerin Daily Replenishing Lotion  Eucerin Dry Skin Therapy Plus Alpha Hydroxy Crme  Eucerin Dry Skin Therapy Plus Alpha Hydroxy Lotion  Eucerin Original Crme  Eucerin Original Lotion  Eucerin Plus Crme Eucerin Plus Lotion  Eucerin TriLipid Replenishing Lotion  Keri Anti-Bacterial Hand Lotion  Keri Deep Conditioning Original Lotion Dry Skin Formula Softly Scented  Keri Deep Conditioning Original Lotion, Fragrance Free Sensitive Skin Formula  Keri Lotion Fast Absorbing Fragrance Free Sensitive Skin Formula  Keri Lotion Fast Absorbing Softly Scented Dry Skin Formula  Keri Original Lotion  Keri Skin Renewal Lotion Keri Silky Smooth Lotion  Keri Silky Smooth Sensitive Skin Lotion  Nivea Body Creamy Conditioning Oil  Nivea Body Extra Enriched Teacher, adult education Moisturizing Lotion Nivea Crme  Nivea Skin Firming Lotion  NutraDerm 30 Skin Lotion  NutraDerm Skin Lotion  NutraDerm Therapeutic Skin Cream  NutraDerm Therapeutic Skin Lotion  ProShield Protective Hand Cream  Provon moisturizing lotion  Please read over the following fact sheets that you were given.

## 2024-03-12 NOTE — Progress Notes (Signed)
 PCP - Elberta Jesus PIETY Cardiologist - Lonni Verlin COME- cardiac clearance 02/20/24- follow up on as needed basis.  Pulmonologist - Vineet Sood,MD Neurology - Richardson Scot COME  PPM/ICD - denies Device Orders -  Rep Notified -   Chest x-ray - 02/16/24 EKG - 02/17/24 Stress Test -  ECHO -  Cardiac Cath -   Sleep Study - pt reports she had a sleep study last week and is waiting for the results.  CPAP -   Fasting Blood Sugar - na Checks Blood Sugar _____ times a day  Last dose of GLP1 agonist-  na GLP1 instructions:   Blood Thinner Instructions:na Aspirin Instructions:na  ERAS Protcol - NPO PRE-SURGERY Ensure or G2-   COVID TEST- na   Anesthesia review: cardiac clearance 02/20/24  Patient denies shortness of breath, fever, cough and chest pain at PAT appointment   All instructions explained to the patient, with a verbal understanding of the material. Patient agrees to go over the instructions while at home for a better understanding. The opportunity to ask questions was provided.

## 2024-03-12 NOTE — Progress Notes (Incomplete)
 PCP - Elberta Jesus PIETY Cardiologist -   PPM/ICD -  Device Orders -  Rep Notified -   Chest x-ray -  EKG -  Stress Test -  ECHO -  Cardiac Cath -   Sleep Study -  CPAP -   Fasting Blood Sugar -  Checks Blood Sugar _____ times a day  Last dose of GLP1 agonist-   GLP1 instructions:   Blood Thinner Instructions: Aspirin Instructions:  ERAS Protcol - PRE-SURGERY Ensure or G2-   COVID TEST-    Anesthesia review:   Patient denies shortness of breath, fever, cough and chest pain at PAT appointment   All instructions explained to the patient, with a verbal understanding of the material. Patient agrees to go over the instructions while at home for a better understanding. Patient also instructed to self quarantine after being tested for COVID-19. The opportunity to ask questions was provided.

## 2024-03-14 NOTE — Anesthesia Preprocedure Evaluation (Addendum)
 Anesthesia Evaluation  Patient identified by MRN, date of birth, ID band Patient awake    Reviewed: Allergy & Precautions, NPO status , Patient's Chart, lab work & pertinent test results  History of Anesthesia Complications Negative for: history of anesthetic complications  Airway Mallampati: I  TM Distance: >3 FB Neck ROM: Full    Dental  (+) Edentulous Upper, Dental Advisory Given, Poor Dentition, Chipped   Pulmonary COPD,  COPD inhaler, Current Smoker and Patient abstained from smoking.   breath sounds clear to auscultation       Cardiovascular hypertension, Pt. on medications and Pt. on home beta blockers  Rhythm:Regular Rate:Normal     Neuro/Psych  Headaches    GI/Hepatic Neg liver ROS,GERD  Controlled,,  Endo/Other  Hypothyroidism  BMI 44  Renal/GU      Musculoskeletal   Abdominal   Peds  Hematology Hb 13.8, plt 407k   Anesthesia Other Findings   Reproductive/Obstetrics                              Anesthesia Physical Anesthesia Plan  ASA: 3  Anesthesia Plan: General   Post-op Pain Management: Tylenol  PO (pre-op)*   Induction: Intravenous  PONV Risk Score and Plan: 2 and Ondansetron  and Dexamethasone  Airway Management Planned: Oral ETT  Additional Equipment: None  Intra-op Plan:   Post-operative Plan: Extubation in OR  Informed Consent: I have reviewed the patients History and Physical, chart, labs and discussed the procedure including the risks, benefits and alternatives for the proposed anesthesia with the patient or authorized representative who has indicated his/her understanding and acceptance.     Dental advisory given  Plan Discussed with: CRNA and Surgeon  Anesthesia Plan Comments: (PAT note written 03/14/2024 by Allison Zelenak, PA-C.  )         Anesthesia Quick Evaluation

## 2024-03-14 NOTE — Progress Notes (Signed)
 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  History includes former smoker, COPD, murmur (no murmur per 02/20/2024 cardiology exam, as needed f/u), HLD, HTN, aortic atherosclerosis, dyspnea, anemia, hepatic steatosis, hypothyroidism, occipital neuralgia, gallstone pancreatitis (09/2023, s/p cholecystectomy 02/21/2024).  BMI is consistent with morbid obesity.  She had cardiology evaluation by Dr. Verlin on 02/20/2024 for murmur and for preoperative evaluation for cholecystectomy and cervical disc surgery. He did not auscultate a murmur. He wrote, Pre-operative cardiovascular examination: She has a normal cardiac exam, normal EKG and normal chest x-ray. She has no symptoms. No further cardiac workup. Stress testing is not indicated. She can proceed with her planned surgical procedures.  As needed cardiology follow-up recommended.  Recent home sleep study this month showed mild OSA.  Anesthesia team to evaluate on the day of surgery.  VS: BP (!) 156/83   Pulse 75   Temp 36.7 C   Resp 18   Ht 5' 3 (1.6 m)   Wt 111.6 kg   SpO2 98%   BMI 43.58 kg/m    PROVIDERS: Jesus Elberta Gainer, FNP PCP Verlin Bruckner, MD is cardiologist.  As needed follow-up at 02/20/2024 visit. Shellia Oh, MD is pulmonologist Scot Dire, MD is neurologist   LABS: Labs reviewed: Acceptable for surgery. AST, ALT, total bili normal on 10/20/2023. A1c 5.5% on 10/11/2023.  (all labs ordered are listed, but only abnormal results are displayed)  Labs Reviewed  BASIC METABOLIC  PANEL WITH GFR - Abnormal; Notable for the following components:      Result Value   BUN 22 (*)    All other components within normal limits  CBC - Abnormal; Notable for the following components:   Platelets 407 (*)    All other components within normal limits  SURGICAL PCR SCREEN  TYPE AND SCREEN    Home Sleep Study 03/08/2024 ([facility]): IMPRESSIONS  - Mild obstructive sleep apnea occurred during this study  pAHI = 8.8-h .  - No significant central sleep apnea occurred during this study  pAHIc = 0.6-h .  - Mild oxygen desaturation was noted during this study  Min O2 = 88% .  - Patient snored 3.4% of the sleep time with volumes > 50 dBs.  - The patient had a PAT-based sleep time of 5 hrs. 40 min. with 14% of the study reported as REM sleep.   IMAGES: CXR 02/16/2024 ([facility]): FINDINGS:  Cardiovascular: Cardiac silhouette and pulmonary vasculature are within normal limits.  Mediastinum: Within normal limits.  Lungs/pleura: Clear. No effusion or pneumothorax.  Upper abdomen: Visualized portions are unremarkable.  Chest wall/osseous structures: No interval osseous or soft tissue changes.  IMPRESSION: There is no evidence of acute cardiac or pulmonary abnormality.  CT Abd/pelvis (pre-cholecystectomy) 12/01/2023: IMPRESSION: 1. Improving pancreatitis. No evidence of pseudocyst or other complicating feature. 2.  Aortic Atherosclerosis (ICD10-I70.0). 3. Bilateral lumbar facet arthropathy.   EKG: 02/17/2024: Normal sinus rhythm   CV: N/A  Past Medical History:  Diagnosis Date   Anemia    Anxiety    Aortic atherosclerosis    Blurred  vision, bilateral    COPD (chronic obstructive pulmonary disease) (HCC)    mild   Dyspnea    Eating disorder    Hand numbness    Hearing loss    Heart murmur    Hepatic steatosis    HLD (hyperlipidemia)    Hypertension    Hypothyroidism    Low back pain    Migraine    Numbness    Left leg   Obesity    Occipital neuralgia of left  side    Pancreatitis    Paraseptal emphysema (HCC)    Pelvic pain    Perforation of both tympanic membranes    Pneumonia    Psychophysiological insomnia    Referred otalgia of left ear    Sinusitis     Past Surgical History:  Procedure Laterality Date   ABDOMINAL HYSTERECTOMY     Partial   CHOLECYSTECTOMY N/A 02/21/2024   Procedure: LAPAROSCOPIC CHOLECYSTECTOMY WITH INTRAOPERATIVE CHOLANGIOGRAM;  Surgeon: Lyndel Deward PARAS, MD;  Location: [facility];  Service: General;  Laterality: N/A;   DENTAL SURGERY     EAR MEATOPLASTY WITH FULL THICKNESS SKIN GRAFT Bilateral    multiple ear surgies   INNER EAR SURGERY     LAPAROSCOPIC OVARIAN CYSTECTOMY     Left   TONSILLECTOMY     WISDOM TOOTH EXTRACTION      MEDICATIONS:  albuterol  (VENTOLIN  HFA) 108 (90 Base) MCG/ACT inhaler   atorvastatin  (LIPITOR) 20 MG tablet   budesonide-formoterol (SYMBICORT) 160-4.5 MCG/ACT inhaler   carbamide peroxide (DEBROX) 6.5 % OTIC solution   celecoxib (CELEBREX) 200 MG capsule   Cholecalciferol (VITAMIN D3) 2000 UNITS TABS   ciprofloxacin-dexamethasone (CIPRODEX) OTIC suspension   clonazePAM  (KLONOPIN ) 0.5 MG tablet   Cyanocobalamin (VITAMIN B-12 PO)   diphenhydramine -acetaminophen  (TYLENOL  PM) 25-500 MG TABS tablet   DULoxetine  (CYMBALTA ) 30 MG capsule   DULoxetine  (CYMBALTA ) 60 MG capsule   Eptinezumab-jjmr (VYEPTI IV)   FIBER PO   levETIRAcetam  (KEPPRA ) 250 MG tablet   levothyroxine  (SYNTHROID ) 25 MCG tablet   methocarbamol  (ROBAXIN ) 750 MG tablet   metoprolol  (LOPRESSOR ) 50 MG tablet   Multiple Vitamins-Minerals (MULTIVITAMIN WITH MINERALS) tablet   NURTEC 75 MG TBDP   oxyCODONE -acetaminophen  (PERCOCET) 5-325 MG tablet   pantoprazole  (PROTONIX ) 40 MG tablet   pregabalin  (LYRICA ) 25 MG capsule   QUEtiapine  (SEROQUEL ) 200 MG tablet   [Paused] rizatriptan  (MAXALT -MLT) 10 MG disintegrating tablet   spironolactone (ALDACTONE) 25 MG tablet   Ubrogepant  (UBRELVY ) 100 MG TABS   venlafaxine  XR  (EFFEXOR  XR) 37.5 MG 24 hr capsule   No current facility-administered medications for this encounter.    Isaiah Ruder, PA-C Surgical Short Stay/Anesthesiology [facility] Phone [phone] [facility] Phone [phone] 03/14/2024 4:08 PM

## 2024-03-22 ENCOUNTER — Ambulatory Visit (HOSPITAL_COMMUNITY)
Admission: RE | Admit: 2024-03-22 | Discharge: 2024-03-23 | Disposition: A | Discharge status: Home / Self Care | Attending: Neurosurgery | Admitting: Neurosurgery

## 2024-03-22 ENCOUNTER — Ambulatory Visit (HOSPITAL_COMMUNITY): Payer: Self-pay | Admitting: Anesthesiology

## 2024-03-22 ENCOUNTER — Encounter (HOSPITAL_COMMUNITY): Admission: RE | Disposition: A | Payer: Self-pay | Source: Home / Self Care | Attending: Neurosurgery

## 2024-03-22 ENCOUNTER — Ambulatory Visit (HOSPITAL_COMMUNITY)

## 2024-03-22 DIAGNOSIS — J449 Chronic obstructive pulmonary disease, unspecified: Secondary | ICD-10-CM

## 2024-03-22 DIAGNOSIS — M47892 Other spondylosis, cervical region: Secondary | ICD-10-CM

## 2024-03-22 DIAGNOSIS — Z87891 Personal history of nicotine dependence: Secondary | ICD-10-CM

## 2024-03-22 DIAGNOSIS — E039 Hypothyroidism, unspecified: Secondary | ICD-10-CM | POA: Diagnosis not present

## 2024-03-22 DIAGNOSIS — Z6841 Body Mass Index (BMI) 40.0 and over, adult: Secondary | ICD-10-CM | POA: Insufficient documentation

## 2024-03-22 DIAGNOSIS — M4722 Other spondylosis with radiculopathy, cervical region: Secondary | ICD-10-CM | POA: Diagnosis present

## 2024-03-22 DIAGNOSIS — E669 Obesity, unspecified: Secondary | ICD-10-CM | POA: Insufficient documentation

## 2024-03-22 DIAGNOSIS — I10 Essential (primary) hypertension: Secondary | ICD-10-CM | POA: Diagnosis not present

## 2024-03-22 DIAGNOSIS — M4802 Spinal stenosis, cervical region: Secondary | ICD-10-CM | POA: Insufficient documentation

## 2024-03-22 DIAGNOSIS — K219 Gastro-esophageal reflux disease without esophagitis: Secondary | ICD-10-CM | POA: Diagnosis not present

## 2024-03-22 HISTORY — PX: ANTERIOR CERVICAL DECOMP/DISCECTOMY FUSION: SHX1161

## 2024-03-22 LAB — ABO/RH: ABO/RH(D): AB NEG

## 2024-03-22 SURGERY — ANTERIOR CERVICAL DECOMPRESSION/DISCECTOMY FUSION 3 LEVELS
Anesthesia: General

## 2024-03-22 MED ORDER — LIDOCAINE 2% (20 MG/ML) 5 ML SYRINGE
INTRAMUSCULAR | Status: AC
  Filled 2024-03-22: qty 5

## 2024-03-22 MED ORDER — LIDOCAINE-EPINEPHRINE 0.5 %-1:200000 IJ SOLN
INTRAMUSCULAR | Status: DC | PRN
  Administered 2024-03-22: 5 mL via INTRADERMAL

## 2024-03-22 MED ORDER — FENTANYL CITRATE (PF) 250 MCG/5ML IJ SOLN
INTRAMUSCULAR | Status: AC
  Filled 2024-03-22: qty 5

## 2024-03-22 MED ORDER — METHOCARBAMOL 500 MG PO TABS
500.0000 mg | ORAL_TABLET | Freq: Four times a day (QID) | ORAL | Status: DC | PRN
Start: 2024-03-22 — End: 2024-03-23
  Administered 2024-03-22 – 2024-03-23 (×3): 500 mg via ORAL
  Filled 2024-03-22 (×3): qty 1

## 2024-03-22 MED ORDER — FLUTICASONE FUROATE-VILANTEROL 100-25 MCG/ACT IN AEPB
1.0000 | INHALATION_SPRAY | Freq: Every day | RESPIRATORY_TRACT | Status: DC
  Filled 2024-03-22: qty 28

## 2024-03-22 MED ORDER — VITAMIN D3 25 MCG (1000 UNIT) PO TABS
2000.0000 [IU] | ORAL_TABLET | Freq: Every morning | ORAL | Status: DC
  Administered 2024-03-23: 2000 [IU] via ORAL
  Filled 2024-03-22 (×2): qty 2

## 2024-03-22 MED ORDER — VANCOMYCIN HCL 1500 MG/300ML IV SOLN: 1500.0000 mg | INTRAVENOUS | Status: AC

## 2024-03-22 MED ORDER — HYDROMORPHONE HCL 1 MG/ML IJ SOLN
INTRAMUSCULAR | Status: DC | PRN
  Administered 2024-03-22 (×2): .25 mg via INTRAVENOUS

## 2024-03-22 MED ORDER — HYDROCODONE-ACETAMINOPHEN 7.5-325 MG PO TABS: 1.0000 | ORAL_TABLET | Freq: Four times a day (QID) | ORAL | Status: DC

## 2024-03-22 MED ORDER — ADULT MULTIVITAMIN W/MINERALS CH
1.0000 | ORAL_TABLET | Freq: Every morning | ORAL | Status: DC
  Administered 2024-03-23: 1 via ORAL
  Filled 2024-03-22: qty 1

## 2024-03-22 MED ORDER — DEXAMETHASONE SODIUM PHOSPHATE 4 MG/ML IJ SOLN
4.0000 mg | Freq: Four times a day (QID) | INTRAMUSCULAR | Status: DC
  Administered 2024-03-22: 4 mg via INTRAVENOUS
  Filled 2024-03-22: qty 1

## 2024-03-22 MED ORDER — OXYCODONE HCL 5 MG PO TABS: 5.0000 mg | ORAL_TABLET | ORAL | Status: DC | PRN

## 2024-03-22 MED ORDER — DEXMEDETOMIDINE HCL IN NACL 80 MCG/20ML IV SOLN
INTRAVENOUS | Status: DC | PRN
Start: 2024-03-22 — End: 2024-03-22
  Administered 2024-03-22: 8 ug via INTRAVENOUS
  Administered 2024-03-22: 4 ug via INTRAVENOUS

## 2024-03-22 MED ORDER — CELECOXIB 200 MG PO CAPS
200.0000 mg | ORAL_CAPSULE | Freq: Every day | ORAL | Status: DC
  Administered 2024-03-22: 200 mg via ORAL
  Filled 2024-03-22: qty 1

## 2024-03-22 MED ORDER — MENTHOL 3 MG MT LOZG: 1.0000 | LOZENGE | OROMUCOSAL | Status: DC | PRN

## 2024-03-22 MED ORDER — PREGABALIN 25 MG PO CAPS
25.0000 mg | ORAL_CAPSULE | Freq: Three times a day (TID) | ORAL | Status: DC
  Administered 2024-03-22 – 2024-03-23 (×2): 25 mg via ORAL
  Filled 2024-03-22 (×2): qty 1

## 2024-03-22 MED ORDER — HYDROCODONE-ACETAMINOPHEN 7.5-325 MG PO TABS
1.0000 | ORAL_TABLET | Freq: Four times a day (QID) | ORAL | Status: DC
  Administered 2024-03-22 – 2024-03-23 (×2): 1 via ORAL
  Filled 2024-03-22 (×2): qty 1

## 2024-03-22 MED ORDER — ONDANSETRON HCL 4 MG/2ML IJ SOLN: 4.0000 mg | Freq: Four times a day (QID) | INTRAMUSCULAR | Status: DC | PRN

## 2024-03-22 MED ORDER — HYDROMORPHONE HCL 1 MG/ML IJ SOLN
INTRAMUSCULAR | Status: AC
  Filled 2024-03-22: qty 1

## 2024-03-22 MED ORDER — THROMBIN 20000 UNITS EX SOLR: CUTANEOUS | Status: DC | PRN

## 2024-03-22 MED ORDER — PHENYLEPHRINE 80 MCG/ML (10ML) SYRINGE FOR IV PUSH (FOR BLOOD PRESSURE SUPPORT)
PREFILLED_SYRINGE | INTRAVENOUS | Status: DC | PRN
  Administered 2024-03-22: 80 ug via INTRAVENOUS

## 2024-03-22 MED ORDER — 0.9 % SODIUM CHLORIDE (POUR BTL) OPTIME
TOPICAL | Status: DC | PRN
Start: 2024-03-22 — End: 2024-03-22
  Administered 2024-03-22: 1000 mL

## 2024-03-22 MED ORDER — PANTOPRAZOLE SODIUM 40 MG PO TBEC
40.0000 mg | DELAYED_RELEASE_TABLET | Freq: Two times a day (BID) | ORAL | Status: DC
  Administered 2024-03-23: 40 mg via ORAL
  Filled 2024-03-22: qty 1

## 2024-03-22 MED ORDER — LABETALOL HCL 5 MG/ML IV SOLN
INTRAVENOUS | Status: AC
  Filled 2024-03-22: qty 4

## 2024-03-22 MED ORDER — VANCOMYCIN HCL 1500 MG/300ML IV SOLN
INTRAVENOUS | Status: AC
Start: 2024-03-22 — End: 2024-03-22
  Administered 2024-03-22: 1500 mg via INTRAVENOUS
  Filled 2024-03-22: qty 300

## 2024-03-22 MED ORDER — LIDOCAINE-EPINEPHRINE 0.5 %-1:200000 IJ SOLN
INTRAMUSCULAR | Status: AC
  Filled 2024-03-22: qty 50

## 2024-03-22 MED ORDER — CHLORHEXIDINE GLUCONATE 0.12 % MT SOLN
OROMUCOSAL | Status: AC
  Administered 2024-03-22: 15 mL via OROMUCOSAL
  Filled 2024-03-22: qty 15

## 2024-03-22 MED ORDER — ONDANSETRON HCL 4 MG/2ML IJ SOLN
INTRAMUSCULAR | Status: AC
  Filled 2024-03-22: qty 2

## 2024-03-22 MED ORDER — ATORVASTATIN CALCIUM 10 MG PO TABS
20.0000 mg | ORAL_TABLET | Freq: Every morning | ORAL | Status: DC
  Administered 2024-03-23: 20 mg via ORAL
  Filled 2024-03-22: qty 2

## 2024-03-22 MED ORDER — DEXAMETHASONE 4 MG PO TABS
4.0000 mg | ORAL_TABLET | Freq: Four times a day (QID) | ORAL | Status: DC
  Administered 2024-03-23 (×2): 4 mg via ORAL
  Filled 2024-03-22 (×2): qty 1

## 2024-03-22 MED ORDER — METOPROLOL TARTRATE 50 MG PO TABS
50.0000 mg | ORAL_TABLET | Freq: Two times a day (BID) | ORAL | Status: DC
  Administered 2024-03-22 – 2024-03-23 (×2): 50 mg via ORAL
  Filled 2024-03-22 (×2): qty 1

## 2024-03-22 MED ORDER — SPIRONOLACTONE 25 MG PO TABS
25.0000 mg | ORAL_TABLET | Freq: Every morning | ORAL | Status: DC
  Administered 2024-03-23: 25 mg via ORAL
  Filled 2024-03-22: qty 1

## 2024-03-22 MED ORDER — SODIUM CHLORIDE 0.9% FLUSH
3.0000 mL | Freq: Two times a day (BID) | INTRAVENOUS | Status: DC
  Administered 2024-03-22: 3 mL via INTRAVENOUS

## 2024-03-22 MED ORDER — ROCURONIUM BROMIDE 10 MG/ML (PF) SYRINGE
PREFILLED_SYRINGE | INTRAVENOUS | Status: AC
  Filled 2024-03-22: qty 10

## 2024-03-22 MED ORDER — ONDANSETRON HCL 4 MG PO TABS: 4.0000 mg | ORAL_TABLET | Freq: Four times a day (QID) | ORAL | Status: DC | PRN

## 2024-03-22 MED ORDER — LIDOCAINE 2% (20 MG/ML) 5 ML SYRINGE
INTRAMUSCULAR | Status: DC | PRN
  Administered 2024-03-22: 20 mg via INTRAVENOUS

## 2024-03-22 MED ORDER — RIMEGEPANT SULFATE 75 MG PO TBDP
75.0000 mg | ORAL_TABLET | ORAL | Status: DC
Start: 2024-03-23 — End: 2024-03-23

## 2024-03-22 MED ORDER — MIDAZOLAM HCL (PF) 2 MG/2ML IJ SOLN: 0.5000 mg | Freq: Once | INTRAMUSCULAR | Status: DC | PRN

## 2024-03-22 MED ORDER — MEPERIDINE HCL 25 MG/ML IJ SOLN: 6.2500 mg | INTRAMUSCULAR | Status: DC | PRN

## 2024-03-22 MED ORDER — HYDROMORPHONE HCL 1 MG/ML IJ SOLN
INTRAMUSCULAR | Status: AC
  Filled 2024-03-22: qty 0.5

## 2024-03-22 MED ORDER — DEXAMETHASONE SODIUM PHOSPHATE 4 MG/ML IJ SOLN: 4.0000 mg | Freq: Four times a day (QID) | INTRAMUSCULAR | Status: DC

## 2024-03-22 MED ORDER — CHLORHEXIDINE GLUCONATE CLOTH 2 % EX PADS
6.0000 | MEDICATED_PAD | Freq: Once | CUTANEOUS | Status: AC
  Administered 2024-03-22: 6 via TOPICAL

## 2024-03-22 MED ORDER — VITAMIN B-12 100 MCG PO TABS
100.0000 ug | ORAL_TABLET | Freq: Every morning | ORAL | Status: DC
  Administered 2024-03-23: 100 ug via ORAL
  Filled 2024-03-22: qty 1

## 2024-03-22 MED ORDER — OXYCODONE HCL 5 MG PO TABS
5.0000 mg | ORAL_TABLET | Freq: Once | ORAL | Status: AC | PRN
  Administered 2024-03-22: 5 mg via ORAL

## 2024-03-22 MED ORDER — OXYCODONE HCL 5 MG PO TABS
ORAL_TABLET | ORAL | Status: AC
  Filled 2024-03-22: qty 1

## 2024-03-22 MED ORDER — ORAL CARE MOUTH RINSE: 15.0000 mL | Freq: Once | OROMUCOSAL | Status: AC

## 2024-03-22 MED ORDER — THROMBIN 20000 UNITS EX SOLR
CUTANEOUS | Status: AC
Start: 2024-03-22 — End: 2024-03-22
  Filled 2024-03-22: qty 20000

## 2024-03-22 MED ORDER — CLONAZEPAM 1 MG PO TABS
1.0000 mg | ORAL_TABLET | Freq: Two times a day (BID) | ORAL | Status: DC
  Administered 2024-03-22 – 2024-03-23 (×2): 1 mg via ORAL
  Filled 2024-03-22 (×2): qty 1

## 2024-03-22 MED ORDER — PROPOFOL 10 MG/ML IV BOLUS
INTRAVENOUS | Status: DC | PRN
Start: 2024-03-22 — End: 2024-03-22
  Administered 2024-03-22: 10 mg via INTRAVENOUS
  Administered 2024-03-22: 80 mg via INTRAVENOUS

## 2024-03-22 MED ORDER — CARBAMIDE PEROXIDE 6.5 % OT SOLN
4.0000 [drp] | Freq: Two times a day (BID) | OTIC | Status: DC
  Filled 2024-03-22: qty 15

## 2024-03-22 MED ORDER — ACETAMINOPHEN 500 MG PO TABS: 1000.0000 mg | ORAL_TABLET | ORAL | Status: AC

## 2024-03-22 MED ORDER — FENTANYL CITRATE (PF) 250 MCG/5ML IJ SOLN
INTRAMUSCULAR | Status: DC | PRN
  Administered 2024-03-22 (×5): 50 ug via INTRAVENOUS
  Administered 2024-03-22: 100 ug via INTRAVENOUS
  Administered 2024-03-22 (×2): 50 ug via INTRAVENOUS

## 2024-03-22 MED ORDER — LACTATED RINGERS IV SOLN: INTRAVENOUS | Status: DC

## 2024-03-22 MED ORDER — DULOXETINE HCL 60 MG PO CPEP
60.0000 mg | ORAL_CAPSULE | Freq: Every day | ORAL | Status: DC
  Administered 2024-03-22: 60 mg via ORAL
  Filled 2024-03-22: qty 1

## 2024-03-22 MED ORDER — LABETALOL HCL 5 MG/ML IV SOLN
INTRAVENOUS | Status: DC | PRN
Start: 2024-03-22 — End: 2024-03-22
  Administered 2024-03-22: 5 mg via INTRAVENOUS

## 2024-03-22 MED ORDER — LEVOTHYROXINE SODIUM 25 MCG PO TABS
25.0000 ug | ORAL_TABLET | Freq: Every day | ORAL | Status: DC
  Administered 2024-03-23: 25 ug via ORAL
  Filled 2024-03-22: qty 1

## 2024-03-22 MED ORDER — ALBUTEROL SULFATE (2.5 MG/3ML) 0.083% IN NEBU: 2.5000 mg | INHALATION_SOLUTION | RESPIRATORY_TRACT | Status: DC | PRN

## 2024-03-22 MED ORDER — POTASSIUM CHLORIDE IN NACL 20-0.9 MEQ/L-% IV SOLN
INTRAVENOUS | Status: DC
  Filled 2024-03-22: qty 1000

## 2024-03-22 MED ORDER — VENLAFAXINE HCL ER 37.5 MG PO CP24
37.5000 mg | ORAL_CAPSULE | Freq: Every day | ORAL | Status: DC
  Administered 2024-03-23: 37.5 mg via ORAL
  Filled 2024-03-22: qty 1

## 2024-03-22 MED ORDER — SODIUM CHLORIDE 0.9 % IV SOLN: INTRAVENOUS | Status: DC | PRN

## 2024-03-22 MED ORDER — HEMOSTATIC AGENTS (NO CHARGE) OPTIME
TOPICAL | Status: DC | PRN
  Administered 2024-03-22: 1 via TOPICAL

## 2024-03-22 MED ORDER — MORPHINE SULFATE (PF) 2 MG/ML IV SOLN
2.0000 mg | INTRAVENOUS | Status: DC | PRN
  Administered 2024-03-22 – 2024-03-23 (×2): 2 mg via INTRAVENOUS
  Filled 2024-03-22 (×2): qty 1

## 2024-03-22 MED ORDER — PROPOFOL 10 MG/ML IV BOLUS
INTRAVENOUS | Status: AC
Start: 2024-03-22 — End: 2024-03-22
  Filled 2024-03-22: qty 20

## 2024-03-22 MED ORDER — MIDAZOLAM HCL 2 MG/2ML IJ SOLN
INTRAMUSCULAR | Status: AC
  Filled 2024-03-22: qty 2

## 2024-03-22 MED ORDER — ROCURONIUM BROMIDE 10 MG/ML (PF) SYRINGE
PREFILLED_SYRINGE | INTRAVENOUS | Status: DC | PRN
  Administered 2024-03-22: 10 mg via INTRAVENOUS
  Administered 2024-03-22 (×2): 5 mg via INTRAVENOUS
  Administered 2024-03-22 (×3): 10 mg via INTRAVENOUS
  Administered 2024-03-22: 5 mg via INTRAVENOUS
  Administered 2024-03-22: 10 mg via INTRAVENOUS
  Administered 2024-03-22: 60 mg via INTRAVENOUS

## 2024-03-22 MED ORDER — CHLORHEXIDINE GLUCONATE 0.12 % MT SOLN: 15.0000 mL | Freq: Once | OROMUCOSAL | Status: AC

## 2024-03-22 MED ORDER — DIAZEPAM 5 MG PO TABS: 5.0000 mg | ORAL_TABLET | Freq: Four times a day (QID) | ORAL | Status: DC | PRN

## 2024-03-22 MED ORDER — PHENOL 1.4 % MT LIQD: 1.0000 | OROMUCOSAL | Status: DC | PRN

## 2024-03-22 MED ORDER — DULOXETINE HCL 30 MG PO CPEP
30.0000 mg | ORAL_CAPSULE | Freq: Every day | ORAL | Status: DC
  Administered 2024-03-22: 30 mg via ORAL
  Filled 2024-03-22: qty 1

## 2024-03-22 MED ORDER — UBROGEPANT 100 MG PO TABS
100.0000 mg | ORAL_TABLET | Freq: Every day | ORAL | Status: DC | PRN
  Filled 2024-03-22: qty 1

## 2024-03-22 MED ORDER — CHLORHEXIDINE GLUCONATE CLOTH 2 % EX PADS: 6.0000 | MEDICATED_PAD | Freq: Once | CUTANEOUS | Status: DC

## 2024-03-22 MED ORDER — ALBUTEROL SULFATE HFA 108 (90 BASE) MCG/ACT IN AERS: 1.0000 | INHALATION_SPRAY | RESPIRATORY_TRACT | Status: DC | PRN

## 2024-03-22 MED ORDER — ZOLPIDEM TARTRATE 5 MG PO TABS: 5.0000 mg | ORAL_TABLET | Freq: Every evening | ORAL | Status: DC | PRN

## 2024-03-22 MED ORDER — ACETAMINOPHEN 500 MG PO TABS
ORAL_TABLET | ORAL | Status: AC
  Administered 2024-03-22: 1000 mg via ORAL
  Filled 2024-03-22: qty 2

## 2024-03-22 MED ORDER — LEVETIRACETAM 500 MG PO TABS
500.0000 mg | ORAL_TABLET | Freq: Two times a day (BID) | ORAL | Status: DC
  Administered 2024-03-22 – 2024-03-23 (×2): 500 mg via ORAL
  Filled 2024-03-22 (×3): qty 1

## 2024-03-22 MED ORDER — THROMBIN 5000 UNITS EX KIT
PACK | CUTANEOUS | Status: AC
Start: 2024-03-22 — End: 2024-03-22
  Filled 2024-03-22: qty 4

## 2024-03-22 MED ORDER — ACETAMINOPHEN 325 MG PO TABS: 650.0000 mg | ORAL_TABLET | ORAL | Status: DC | PRN

## 2024-03-22 MED ORDER — OXYCODONE HCL 5 MG PO TABS
10.0000 mg | ORAL_TABLET | ORAL | Status: DC | PRN
  Administered 2024-03-22 – 2024-03-23 (×4): 10 mg via ORAL
  Filled 2024-03-22 (×4): qty 2

## 2024-03-22 MED ORDER — LABETALOL HCL 5 MG/ML IV SOLN
10.0000 mg | Freq: Once | INTRAVENOUS | Status: AC
  Administered 2024-03-22: 10 mg via INTRAVENOUS

## 2024-03-22 MED ORDER — SUGAMMADEX SODIUM 200 MG/2ML IV SOLN
INTRAVENOUS | Status: DC | PRN
  Administered 2024-03-22: 250 mg via INTRAVENOUS

## 2024-03-22 MED ORDER — OXYCODONE HCL 5 MG/5ML PO SOLN: 5.0000 mg | Freq: Once | ORAL | Status: AC | PRN

## 2024-03-22 MED ORDER — QUETIAPINE FUMARATE 200 MG PO TABS
200.0000 mg | ORAL_TABLET | Freq: Every day | ORAL | Status: DC
  Administered 2024-03-22: 200 mg via ORAL
  Filled 2024-03-22 (×2): qty 1

## 2024-03-22 MED ORDER — HYDROMORPHONE HCL 1 MG/ML IJ SOLN
0.2500 mg | INTRAMUSCULAR | Status: DC | PRN
  Administered 2024-03-22: 0.5 mg via INTRAVENOUS

## 2024-03-22 MED ORDER — SODIUM CHLORIDE 0.9% FLUSH: 3.0000 mL | INTRAVENOUS | Status: DC | PRN

## 2024-03-22 MED ORDER — DEXAMETHASONE SOD PHOSPHATE PF 10 MG/ML IJ SOLN
INTRAMUSCULAR | Status: DC | PRN
  Administered 2024-03-22: 10 mg via INTRAVENOUS

## 2024-03-22 MED ORDER — ONDANSETRON HCL 4 MG/2ML IJ SOLN
INTRAMUSCULAR | Status: DC | PRN
  Administered 2024-03-22: 4 mg via INTRAVENOUS

## 2024-03-22 MED ORDER — DEXAMETHASONE 4 MG PO TABS: 4.0000 mg | ORAL_TABLET | Freq: Four times a day (QID) | ORAL | Status: DC

## 2024-03-22 MED ORDER — MIDAZOLAM HCL (PF) 2 MG/2ML IJ SOLN
INTRAMUSCULAR | Status: DC | PRN
  Administered 2024-03-22: 2 mg via INTRAVENOUS

## 2024-03-22 MED ORDER — ACETAMINOPHEN 650 MG RE SUPP: 650.0000 mg | RECTAL | Status: DC | PRN

## 2024-03-22 MED ORDER — SODIUM CHLORIDE 0.9 % IV SOLN: 250.0000 mL | INTRAVENOUS | Status: DC

## 2024-03-22 SURGICAL SUPPLY — 46 items
ALLOGRAFT 10X11X14CC (Bone Implant) IMPLANT
ALLOGRAFT LORDOTIC CC 7X11X14 (Bone Implant) IMPLANT
ALLOGRFT BNE OSSIFUSE FBR 1CC (Bone Implant) IMPLANT
BAG COUNTER SPONGE SURGICOUNT (BAG) ×2 IMPLANT
BAND RUBBER #18 3X1/16 STRL (MISCELLANEOUS) ×4 IMPLANT
BLADE CLIPPER SURG (BLADE) IMPLANT
BUR DRUM 4.0 (BURR) ×2 IMPLANT
BUR MATCHSTICK NEURO 3.0 LAGG (BURR) ×2 IMPLANT
CANISTER SUCTION 3000ML PPV (SUCTIONS) ×2 IMPLANT
DERMABOND ADVANCED .7 DNX12 (GAUZE/BANDAGES/DRESSINGS) ×2 IMPLANT
DRAPE HALF SHEET 40X57 (DRAPES) IMPLANT
DRAPE LAPAROTOMY 100X72 PEDS (DRAPES) ×2 IMPLANT
DRAPE MICROSCOPE SLANT 54X150 (MISCELLANEOUS) ×2 IMPLANT
DURAPREP 6ML APPLICATOR 50/CS (WOUND CARE) ×2 IMPLANT
ELECT COATED BLADE 2.86 ST (ELECTRODE) ×2 IMPLANT
ELECTRODE REM PT RTRN 9FT ADLT (ELECTROSURGICAL) ×2 IMPLANT
GAUZE 4X4 16PLY ~~LOC~~+RFID DBL (SPONGE) IMPLANT
GLOVE ECLIPSE 6.5 STRL STRAW (GLOVE) ×2 IMPLANT
GOWN STRL REUS W/ TWL LRG LVL3 (GOWN DISPOSABLE) ×4 IMPLANT
GOWN STRL REUS W/ TWL XL LVL3 (GOWN DISPOSABLE) IMPLANT
GOWN STRL REUS W/TWL 2XL LVL3 (GOWN DISPOSABLE) IMPLANT
HALTER HD/CHIN CERV TRACTION D (MISCELLANEOUS) IMPLANT
HEMOSTAT SURGICEL 2X14 (HEMOSTASIS) IMPLANT
KIT BASIN OR (CUSTOM PROCEDURE TRAY) ×2 IMPLANT
KIT TURNOVER KIT B (KITS) ×2 IMPLANT
NDL HYPO 25X1 1.5 SAFETY (NEEDLE) ×2 IMPLANT
NDL SPNL 22GX3.5 QUINCKE BK (NEEDLE) ×2 IMPLANT
NEEDLE HYPO 25X1 1.5 SAFETY (NEEDLE) ×1 IMPLANT
NEEDLE SPNL 22GX3.5 QUINCKE BK (NEEDLE) ×1 IMPLANT
PACK LAMINECTOMY NEURO (CUSTOM PROCEDURE TRAY) ×2 IMPLANT
PAD ARMBOARD POSITIONER FOAM (MISCELLANEOUS) ×2 IMPLANT
PLATE ACP 1.9X60 3LVL (Plate) IMPLANT
SCREW ACP 3.5X13 S/D VAR ANGLE (Screw) IMPLANT
SCREW ACP ST VARI 3.5X13 (Screw) IMPLANT
SCREW ACP VA ST 3.5X15 (Screw) IMPLANT
SOLN 0.9% NACL POUR BTL 1000ML (IV SOLUTION) ×2 IMPLANT
SOLN STERILE WATER BTL 1000 ML (IV SOLUTION) ×2 IMPLANT
SPACER COLONIAL LG 12X12X14 0D (Spacer) IMPLANT
SPIKE FLUID TRANSFER (MISCELLANEOUS) ×2 IMPLANT
SPONGE INTESTINAL PEANUT (DISPOSABLE) ×2 IMPLANT
SPONGE SURGIFOAM ABS GEL 100 (HEMOSTASIS) IMPLANT
SURGIFLO W/THROMBIN 8M KIT (HEMOSTASIS) IMPLANT
SUT VIC AB 0 CT1 27XBRD ANTBC (SUTURE) IMPLANT
SUT VIC AB 3-0 SH 8-18 (SUTURE) ×2 IMPLANT
TOWEL GREEN STERILE (TOWEL DISPOSABLE) ×2 IMPLANT
TOWEL GREEN STERILE FF (TOWEL DISPOSABLE) ×2 IMPLANT

## 2024-03-22 NOTE — H&P (Signed)
 BP (!) 147/90 (BP Location: Right Arm)   Pulse (!) 120   Temp 98.2 F (36.8 C) (Oral)   Resp 19   Ht 5' 3 (1.6 m)   Wt 111.6 kg   SpO2 95%   BMI 43.58 kg/m  Kendra Parker Comes in today so we can go over the results of the injections she had to the facets on the left side at 3-4 and 4-5.  Both are wildly arthropathic and both are edematous on MRI.  She says she unfortunately received no relief.  She does feel that she has some weakness in the left hand, but her pain does not radiate.  It is around the shoulder and going down towards the shoulder blade only on the left side.  She does not have pain radiating down into the hand, but a feeling of dropping things. C5-6 is very tight, very stenotic and foraminally quite narrow also, especially on the left.  She has agreed to undergo an ACDF at 3-4, 4-5, and at 5-6.  I was going to hold off on 5-6  only because she says the pain did not radiate, but she is feeling slightly weak in the hand.   On exam today,5/5 strength in the upper extremities, including grips.  Normal lower extremities.  Reflexes intact, 2+ biceps, triceps, brachioradialis.  No Hoffmann sign.  She weighs 243 pounds.  Temperature is 97.6, blood pressure is 117/76, pulse 79.  Pain is 10/10. Allergies  Allergen Reactions   Lamotrigine Rash    Only at 100 mg is intolerable-gets a rash from head to toe   Topiramate Other (See Comments)    Visual problems   Penicillins Rash   Past Medical History:  Diagnosis Date   Anemia    Anxiety    Aortic atherosclerosis    Blurred vision, bilateral    COPD (chronic obstructive pulmonary disease) (HCC)    mild   Dyspnea    Eating disorder    Hand numbness    Hearing loss    Heart murmur    Hepatic steatosis    HLD (hyperlipidemia)    Hypertension    Hypothyroidism    Low back pain    Migraine    Numbness    Left leg   Obesity    Occipital neuralgia of left side    Pancreatitis    Paraseptal emphysema (HCC)    Pelvic pain     Perforation of both tympanic membranes    Pneumonia    Psychophysiological insomnia    Referred otalgia of left ear    Sinusitis    Past Surgical History:  Procedure Laterality Date   ABDOMINAL HYSTERECTOMY     Partial   CHOLECYSTECTOMY N/A 02/21/2024   Procedure: LAPAROSCOPIC CHOLECYSTECTOMY WITH INTRAOPERATIVE CHOLANGIOGRAM;  Surgeon: Lyndel Deward PARAS, MD;  Location: [facility];  Service: General;  Laterality: N/A;   DENTAL SURGERY     EAR MEATOPLASTY WITH FULL THICKNESS SKIN GRAFT Bilateral    multiple ear surgies   INNER EAR SURGERY     LAPAROSCOPIC OVARIAN CYSTECTOMY     Left   TONSILLECTOMY     WISDOM TOOTH EXTRACTION     Family History  Problem Relation Age of Onset   Diabetes Mother    Multiple myeloma Mother    Breast cancer Mother    Seizures Father    Diabetes Maternal Grandmother    Cataracts Maternal Grandmother    Cataracts Paternal Grandmother    Arthritis Paternal Grandmother  Cataracts Paternal Grandfather    Arthritis Paternal Grandfather    Stroke Paternal Grandfather    Cancer Maternal Aunt    Cancer Maternal Uncle    Prior to Admission medications   Medication Sig Start Date End Date Taking? Authorizing Provider  albuterol  (VENTOLIN  HFA) 108 (90 Base) MCG/ACT inhaler inhale 2 puffs into the lungs by mouth every 4 hours as needed for wheezing 11/17/20  Yes [provider]  atorvastatin  (LIPITOR) 20 MG tablet Take 20 mg by mouth in the morning.   Yes [provider]  budesonide-formoterol (SYMBICORT) 160-4.5 MCG/ACT inhaler Inhale 2 puffs into the lungs 2 (two) times daily.   Yes [provider]  carbamide peroxide (DEBROX) 6.5 % OTIC solution Place 4 drops into the left ear 2 (two) times daily.   Yes [provider]  celecoxib (CELEBREX) 200 MG capsule Take 200 mg by mouth at bedtime.   Yes [provider]  Cholecalciferol (VITAMIN D3) 2000 UNITS TABS Take 2,000 Units by mouth in the morning.   Yes [provider]  ciprofloxacin-dexamethasone (CIPRODEX) OTIC suspension Place 4 drops into both ears 2 (two) times daily. Patient not taking: Reported on 02/20/2024 02/11/24  Yes [provider]  clonazePAM  (KLONOPIN ) 0.5 MG tablet Take 1 mg by mouth 2 (two) times daily.   Yes [provider]  Cyanocobalamin (VITAMIN B-12 PO) Take 1 capsule by mouth in the morning.   Yes [provider]  diphenhydramine -acetaminophen  (TYLENOL  PM) 25-500 MG TABS tablet Take 2 tablets by mouth at bedtime.   Yes [provider]  DULoxetine  (CYMBALTA ) 30 MG capsule Take 30 mg by mouth at bedtime. 30 mg + 60 mg=90 mg 03/22/22  Yes [provider]  DULoxetine  (CYMBALTA ) 60 MG capsule Take 60 mg by mouth at bedtime. 60 mg + 30 mg=90 mg 11/17/20  Yes [provider]  Eptinezumab-jjmr (VYEPTI IV) Inject 300 mg into the vein every 3 (three) months. VYEPTI   Yes [provider]  FIBER PO Take 3 tablets by mouth in the morning and at bedtime.   Yes [provider]  levETIRAcetam  (KEPPRA ) 250 MG tablet Take 500 mg by mouth 2 (two) times daily.   Yes [provider]  levothyroxine  (SYNTHROID ) 25 MCG tablet Take 25 mcg by mouth daily before breakfast. 06/28/22  Yes [provider]  methocarbamol  (ROBAXIN ) 750 MG tablet Take every 8 hours to treat acute migraines. #30 allowed per month. Patient taking differently: Take 750 mg by mouth 3 (three) times daily. 02/20/21  Yes Onita Duos, MD  metoprolol  (LOPRESSOR ) 50 MG tablet Take 50 mg by mouth 2 (two) times daily.  12/14/14  Yes [provider]  Multiple Vitamins-Minerals (MULTIVITAMIN WITH MINERALS) tablet Take 1 tablet by mouth in the morning.   Yes [provider]  NURTEC 75 MG TBDP Take 75 mg by mouth every other day.   Yes [provider]  oxyCODONE -acetaminophen  (PERCOCET) 5-325 MG tablet Take 1 tablet by mouth every 4 (four) hours as needed for severe pain (pain score  7-10). 02/21/24 02/20/25 Yes Metzger, Cordella LABOR, MD  pantoprazole  (PROTONIX ) 40 MG tablet Take 1 tablet (40 mg total) by mouth 2 (two) times daily before a meal. 10/22/23  Yes Tobie Yetta HERO, MD  pregabalin  (LYRICA ) 25 MG capsule Take 1 capsule (25 mg total) by mouth 3 (three) times daily. 10/22/23  Yes Tobie Yetta HERO, MD  QUEtiapine  (SEROQUEL ) 200 MG tablet Take 200 mg by mouth at bedtime.  Yes [provider]  rizatriptan  (MAXALT -MLT) 10 MG disintegrating tablet Take 10 mg by mouth as needed for migraine (nausea). No more than 4 doses of any combination of triptans per week per neurology   Yes [provider]  spironolactone (ALDACTONE) 25 MG tablet Take 25 mg by mouth in the morning.   Yes [provider]  Ubrogepant  (UBRELVY ) 100 MG TABS Take 100 mg by mouth daily as needed (migraines). Max of 2 doses per week (per neurology)   Yes [provider]  venlafaxine  XR (EFFEXOR  XR) 37.5 MG 24 hr capsule Take 1 capsule (37.5 mg total) by mouth daily with breakfast. 07/10/20  Yes Gayland Lauraine PARAS, NP

## 2024-03-22 NOTE — OR Nursing (Signed)
 Assumed care of patient at 85.

## 2024-03-22 NOTE — Anesthesia Postprocedure Evaluation (Signed)
 Anesthesia Post Note  Patient: Kendra Parker  Procedure(s) Performed: ANTERIOR CERVICAL DECOMPRESSION/DISCECTOMY FUSION CERVICAL THREE-FOUR, CERVICAL FOUR- FIVE, CERVICAL FIVE-SIX     Patient location during evaluation: PACU Anesthesia Type: General Level of consciousness: awake and alert Pain management: pain level controlled Vital Signs Assessment: post-procedure vital signs reviewed and stable Respiratory status: spontaneous breathing, nonlabored ventilation, respiratory function stable and patient connected to nasal cannula oxygen Cardiovascular status: blood pressure returned to baseline and stable Postop Assessment: no apparent nausea or vomiting Anesthetic complications: no   No notable events documented.  Last Vitals:  Vitals:   03/22/24 1945 03/22/24 2000  BP: (!) 158/78 (!) 144/91  Pulse: (!) 124 (!) 122  Resp: 20 10  Temp:    SpO2: 92% 94%    Last Pain:  Vitals:   03/22/24 2000  TempSrc:   PainSc: Asleep                 Karrina Lye S

## 2024-03-22 NOTE — Transfer of Care (Signed)
 Immediate Anesthesia Transfer of Care Note  Patient: Kendra Parker  Procedure(s) Performed: ANTERIOR CERVICAL DECOMPRESSION/DISCECTOMY FUSION CERVICAL THREE-FOUR, CERVICAL FOUR- FIVE, CERVICAL FIVE-SIX  Patient Location: PACU  Anesthesia Type:General  Level of Consciousness: awake  Airway & Oxygen Therapy: Patient Spontanous Breathing and Patient connected to nasal cannula oxygen  Post-op Assessment: Report given to RN and Post -op Vital signs reviewed and stable  Post vital signs: Reviewed and stable  Last Vitals:  Vitals Value Taken Time  BP 143/75 03/22/24 19:38  Temp 37.3 C 03/22/24 19:38  Pulse 121 03/22/24 19:41  Resp 14 03/22/24 19:41  SpO2 93 % 03/22/24 19:41  Vitals shown include unfiled device data.  Last Pain:  Vitals:   03/22/24 1034  TempSrc:   PainSc: 0-No pain      Patients Stated Pain Goal: 0 (03/22/24 1032)  Complications: No notable events documented.

## 2024-03-22 NOTE — Anesthesia Procedure Notes (Signed)
 Procedure Name: Intubation Date/Time: 03/22/2024 2:40 PM  Performed by: Jaedin Trumbo J, CRNAPre-anesthesia Checklist: Patient identified, Emergency Drugs available, Suction available and Patient being monitored Patient Re-evaluated:Patient Re-evaluated prior to induction Oxygen Delivery Method: Circle System Utilized Preoxygenation: Pre-oxygenation with 100% oxygen Induction Type: IV induction Ventilation: Mask ventilation without difficulty and Oral airway inserted - appropriate to patient size Laryngoscope Size: Glidescope and 3 Grade View: Grade I Tube type: Oral Tube size: 7.0 mm Number of attempts: 1 Airway Equipment and Method: Stylet and Oral airway Placement Confirmation: ETT inserted through vocal cords under direct vision, positive ETCO2 and breath sounds checked- equal and bilateral Secured at: 21 cm Tube secured with: Tape Dental Injury: Teeth and Oropharynx as per pre-operative assessment

## 2024-03-22 NOTE — Op Note (Signed)
 03/22/2024  8:07 PM  PATIENT:  Kendra Parker  53 y.o. 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 C3/4,4/5 left,  Cervical stenosis C5/6 with radiculopathy POST-OPERATIVE DIAGNOSIS:  same  PROCEDURE:  Anterior Cervical decompression C3/4,4/5,5/6 Arthrodesis C3/4 with 7mm structural allograft, C4/5with 10mm allograft C5/6 with 12mm peek graft  Allograft morsels Anterior instrumentation(Nuvasive) C3-6  SURGEON:   Surgeon(s): Debby Dorn MATSU, MD Gillie Duncans, MD   ASSISTANTS:Thomas  ANESTHESIA:   general  EBL:  Total I/O In: 900 [I.V.:900] Out: 50 [Urine:50]  BLOOD ADMINISTERED:none  CELL SAVER GIVEN:none  COUNT:per nursing  DRAINS: Urinary Catheter (Foley)   SPECIMEN:  No Specimen  DICTATION: Kendra Parker was taken to the operating room, intubated, and placed under general anesthesia without difficulty. She was positioned supine with her head in slight extension on a horseshoe headrest. 5lbs of traction were applied to the head. The neck was prepped and draped in a sterile manner. I infiltrated 5 cc's 1/2%lidocaine /1:200,000 strength epinephrine into the planned incision starting from the midline to the medial border of the left sternocleidomastoid muscle. I opened the incision with a 10 blade and dissected sharply through soft tissue to the platysma. I dissected in the plane superior to the platysma both rostrally and caudally. I then opened the platysma in a horizontal fashion with Metzenbaum scissors, and dissected in the inferior plane rostrally and caudally. With both blunt and sharp technique I created an avascular corridor to the cervical spine. I placed a spinal needle(s) in the disc space at C3/4 . I then reflected the longus colli from C3 to C6 and placed self retaining retractors. I opened the disc space(s) at 3/4,4/5, and 5/6 with a 15 blade. I removed disc with curettes, Kerrison punches, and  the drill. Using the drill I removed osteophytes and prepared for the decompression.  I decompressed the spinal canal and the C4,5,and 6 root(s) with the drill, Kerrison punches, and the curettes. I used the microscope to aid in microdissection. I removed the posterior longitudinal ligament to fully expose and decompress the thecal sac. I exposed the roots laterally taking down the 3/4,4/5,5/6 uncovertebral joints. With the decompression complete I moved on to the arthrodesis. I used the drill to level the surfaces of C3,4,5,and 6. I removed soft tissue to prepare the disc space and the bony surfaces. I measured the space and placed a 7mm structural allograft into the disc space at 3/4, 10mm allograft at 4/5, and a 12mm Peek packed with allograft morsels.  I then placed the anterior instrumentation. I placed 2 screws in each vertebral body through the plate. I locked the screws into place. Intraoperative xray showed the graft, plate, and screws to be in good position. I irrigated the wound, achieved hemostasis, and closed the wound in layers. I approximated the platysma, and the subcuticular plane with vicryl sutures. I used Dermabond for a sterile dressing.   PLAN OF CARE: Admit for overnight observation  PATIENT DISPOSITION:  PACU - hemodynamically stable.   Delay start of Pharmacological VTE agent (>24hrs) due to surgical blood loss or risk of bleeding:  yes

## 2024-03-23 DIAGNOSIS — M4722 Other spondylosis with radiculopathy, cervical region: Secondary | ICD-10-CM | POA: Diagnosis not present

## 2024-03-23 MED ORDER — OXYCODONE HCL 5 MG PO TABS: 5.0000 mg | ORAL_TABLET | Freq: Four times a day (QID) | ORAL | 0 refills | Status: AC | PRN

## 2024-03-23 MED ORDER — TIZANIDINE HCL 4 MG PO TABS: 4.0000 mg | ORAL_TABLET | Freq: Four times a day (QID) | ORAL | 0 refills | Status: AC | PRN

## 2024-03-23 NOTE — Discharge Summary (Signed)
 Physician Discharge Summary  Patient ID: Kendra Parker MRN: [id number] DOB/AGE: Aug 22, 1970 53 y.o.  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 Post op she is ambulating, speaking voice is strong, tolerating a regular diet, and moving all extremities well. The wound is clean, dry, without signs of infection.   Treatments: surgery: Anterior Cervical decompression C3/4,4/5,5/6 Arthrodesis C3/4 with 7mm structural allograft, C4/5with 10mm allograft C5/6 with 12mm peek graft  Allograft morsels Anterior instrumentation(Nuvasive) C3-6  Discharge Exam:  General appearance: alert, cooperative, appears stated age, and mild distress  Disposition: Discharge disposition: 01-Home or Self Care      Osteoarthritis of facet joint of cervical spine  Allergies as of 03/23/2024       Reactions   Lamotrigine Rash   Only at 100 mg is intolerable-gets a rash from head to toe   Topiramate Other (See Comments)   Visual problems   Penicillins Rash        Medication List     PAUSE taking these medications    rizatriptan  10 MG disintegrating tablet Wait to take this until your doctor or other care provider tells you to start again. Until seen by neurology Commonly known as: MAXALT -MLT Take 10 mg by mouth as needed for migraine (nausea). No more than 4 doses of any combination of triptans per week per neurology       TAKE these medications    albuterol  108 (90 Base) MCG/ACT inhaler Commonly known as: VENTOLIN  HFA inhale 2 puffs  into the lungs by mouth every 4 hours as needed for wheezing   atorvastatin  20 MG tablet Commonly known as: LIPITOR Take 20 mg by mouth in the morning.   budesonide-formoterol 160-4.5 MCG/ACT inhaler Commonly known as: SYMBICORT Inhale 2 puffs into the lungs 2 (two) times daily.   carbamide peroxide 6.5 % OTIC solution Commonly known as: DEBROX Place 4 drops into the left ear 2 (two) times daily.   celecoxib 200 MG capsule Commonly known as: CELEBREX Take 200 mg by mouth at bedtime.   ciprofloxacin-dexamethasone OTIC suspension Commonly known as: CIPRODEX Place 4 drops into both ears 2 (two) times daily.   clonazePAM  0.5 MG tablet Commonly known as: KLONOPIN  Take 1 mg by mouth 2 (two) times daily.   diphenhydramine -acetaminophen  25-500 MG Tabs tablet Commonly known as: TYLENOL  PM Take 2 tablets by mouth at bedtime.   DULoxetine  60 MG capsule Commonly known as: CYMBALTA  Take 60 mg by mouth at bedtime. 60 mg + 30 mg=90 mg   DULoxetine  30 MG capsule Commonly known as: CYMBALTA  Take 30 mg by mouth at bedtime. 30 mg + 60 mg=90 mg   FIBER PO Take 3 tablets by mouth in the morning and at bedtime.   levETIRAcetam  250 MG tablet Commonly known as: KEPPRA  Take 500 mg by mouth 2 (two) times daily.   levothyroxine  25 MCG tablet Commonly known as: SYNTHROID  Take 25 mcg by mouth daily before breakfast.   methocarbamol  750 MG tablet Commonly known as: ROBAXIN  Take every 8 hours to treat acute migraines. #30 allowed per month. What changed:  how much to take how to take this  when to take this additional instructions   metoprolol  tartrate 50 MG tablet Commonly known as: LOPRESSOR  Take 50 mg by mouth 2 (two) times daily.   multivitamin with minerals tablet Take 1 tablet by mouth in the morning.   Nurtec 75 MG Tbdp Generic drug: Rimegepant Sulfate  Take 75 mg by mouth every other day.   oxyCODONE -acetaminophen  5-325 MG tablet Commonly known as: Percocet Take 1 tablet  by mouth every 4 (four) hours as needed for severe pain (pain score 7-10).   pantoprazole  40 MG tablet Commonly known as: PROTONIX  Take 1 tablet (40 mg total) by mouth 2 (two) times daily before a meal.   pregabalin  25 MG capsule Commonly known as: LYRICA  Take 1 capsule (25 mg total) by mouth 3 (three) times daily.   QUEtiapine  200 MG tablet Commonly known as: SEROQUEL  Take 200 mg by mouth at bedtime.   spironolactone 25 MG tablet Commonly known as: ALDACTONE Take 25 mg by mouth in the morning.   Ubrelvy  100 MG Tabs Generic drug: Ubrogepant  Take 100 mg by mouth daily as needed (migraines). Max of 2 doses per week (per neurology)   venlafaxine  XR 37.5 MG 24 hr capsule Commonly known as: Effexor  XR Take 1 capsule (37.5 mg total) by mouth daily with breakfast.   VITAMIN B-12 PO Take 1 capsule by mouth in the morning.   Vitamin D3 50 MCG (2000 UT) Tabs Take 2,000 Units by mouth in the morning.   VYEPTI IV Inject 300 mg into the vein every 3 (three) months. VYEPTI        Follow-up Information     Parker Duncans, MD Follow up.   Specialty: Neurosurgery Why: keep your scheduled appointment Contact information: [address] [place] [address] [phone]                 Signed: Duncans Parker 03/23/2024, 1:48 PM

## 2024-03-23 NOTE — Discharge Instructions (Addendum)
Anterior Cervical Fusion Care After Pinching of the nerves is a common cause of long-term pain. When this happens, a procedure called an anterior cervical fusion is sometimes performed. It relieves the pressure on the pinched nerve roots or spinal cord in the neck. An anterior cervical fusion means that the operation is done through the front (anterior) of your neck to fuse bones in your neck together. This procedure is done to relieve the pressure on pinched nerve roots or spinal cord. This operation is done to control the movement of your spine, which may be pressing on the nerves. This may relieve the pain. The procedure that stops the movement of the spine is called a fusion. The cut by the surgeon (incision) is usually within a skin fold line under your chin. After moving the neck muscles gently apart, the neurosurgeon uses an operating microscope and removes the injured intervertebral disk (the cushion or pad of tissue between the bones of the spine). This takes the pressure off the nerves or spinal cord. This is called decompression. The area where the disc was removed is then filled with a bone graft. The graft will fuse the vertebrae together over time. This means it causes the vertebral bodies to grow together. The bone graft may be obtained from your own bone (your hip for example), or may be obtained from a bone bank. Receiving bone from a bone bank is similar to a blood bank, only the bone comes from human donors who have recently died. This type of graft is referred to as allograft bone. The preformed bone plug is safe and will not be rejected by your body. It does not contain blood cells. In some cases, the surgeon may use hardware in your neck to help stabilize it. This means that metal plates or pins or screws may be used to:  Provide extra support to the neck.   Help the bones to grow together more easily.  A cervical fusion procedure takes a couple hours to several hours, depending on  what needs to be done. Your caregiver will be able to answer your questions for you. HOME CARE INSTRUCTIONS   It will be normal to have a sore throat and have difficulty swallowing foods for a couple weeks following surgery. See your caregiver if this seems to be getting worse rather than better.   You may resume normal diet and activities as directed or allowed. Generally, walking and stair climbing are fine. Avoid lifting more than ten pounds and do no lifting above your head.   If given a cervical collar, remove only for bathing and eating, or as directed.   Use only showers for cleaning up, with no bathing, until seen.   You may apply ice to the surgical or bone donor site for 15 to 20 minutes each hour while awake for the first couple days following surgery. Put the ice in a plastic bag and place a towel between the bag of ice and your skin.   Change dressings if necessary or as directed.   You may drive in 10 days   Take prescribed medication as directed. Only take over-the-counter or prescription medicines for pain, discomfort, or fever as directed by your caregiver.   Make an appointment to see your caregiver for suture or staple removal when instructed.   If physical therapy was prescribed, follow your caregiver's directions.  SEEK IMMEDIATE MEDICAL CARE IF:  There is redness, swelling, or increasing pain in the wound.   There is  pus coming from the wound.   An unexplained oral temperature over 102 F (38.9 C) develops.   There is a bad smell coming from the wound or dressing.   You have swelling in your calf or leg.   You develop shortness of breath or chest pain.   The wound edges break open after sutures or staples have been removed.   Your pain is not controlled with medicine.   You seem to be getting worse rather than better.  Document Released: 12/23/2003 Document Revised: 01/20/2011 Document Reviewed: 02/28/2008 [facility] Patient Information 2012 [place].

## 2024-03-23 NOTE — Progress Notes (Signed)
 PT Cancellation Note  Patient Details Name: Kendra Parker MRN: [id number] DOB: May 20, 1971   Cancelled Treatment:    Reason Eval/Treat Not Completed: PT screened, no needs identified, will sign off (OT evaluated; no PT needs)  Kendra Parker, PT, DPT Acute Rehabilitation Services Office [phone]    Kendra Parker 03/23/2024, 9:29 AM

## 2024-03-23 NOTE — Evaluation (Signed)
 Occupational Therapy Evaluation Patient Details Name: Kendra Parker MRN: [id number] DOB: [birth year] Today's Date: 03/23/2024   History of Present Illness   Pt is a 53 y/o F who presented for ACDF C3-4, C4-5, C5-6 on 03/22/24. PMHx: anxiety, COPD, obesity, HTN, HLD.     Clinical Impressions Pt received exiting bathroom upon OT arrival, agreeable for session. Spouse in room assisting pt. PTA, pt lived with her spouse, but anticipates discharging to her sister's house - 1 story house with ramped entry. Independent with ADLs and mobility, not working, + limited driving. Functionally, pt was mod I for UB/LB ADLs and supervision at most for functional transfers and mobility without AD. Reviewed precautions and handout provided.   Pt is functioning at a level that is adequate for d/c from an OT standpoint, will sign-off.     If plan is discharge home, recommend the following:   A little help with bathing/dressing/bathroom;Assistance with cooking/housework;Assist for transportation     Functional Status Assessment         Equipment Recommendations   None recommended by OT     Recommendations for Other Services         Precautions/Restrictions   Precautions Precautions: Fall;Cervical Precaution Booklet Issued: Yes (comment) Recall of Precautions/Restrictions: Intact Restrictions Weight Bearing Restrictions Per Provider Order: No     Mobility Bed Mobility Overal bed mobility: Needs Assistance Bed Mobility: Rolling, Sidelying to Sit, Sit to Supine Rolling: Supervision Sidelying to sit: Supervision   Sit to supine: Supervision   General bed mobility comments: cued through log roll technique    Transfers Overall transfer level: Needs assistance Equipment used: None Transfers: Sit to/from Stand Sit to Stand: Supervision           General transfer comment: Stood from bed and toilet with distant supervision, used grab bars in bathroom to assist.       Balance Overall balance assessment: Mild deficits observed, not formally tested                                         ADL either performed or assessed with clinical judgement   ADL Overall ADL's : Modified independent                                       General ADL Comments: donned tshirt, underwear, pants, socks, slide-on sneakers without difficulty, maintaining precautions well     Vision Baseline Vision/History: 1 Wears glasses Ability to See in Adequate Light: 0 Adequate Patient Visual Report: No change from baseline       Perception         Praxis         Pertinent Vitals/Pain Pain Assessment Pain Assessment: 0-10 Pain Score:  (30) Pain Location: neck Pain Descriptors / Indicators: Discomfort Pain Intervention(s): Monitored during session, Repositioned     Extremity/Trunk Assessment Upper Extremity Assessment Upper Extremity Assessment: Right hand dominant;LUE deficits/detail LUE Deficits / Details: pt endorsing radiating LUE pain from shoulder blade down to elbow, gross strength ~4-/5 LUE Coordination: decreased gross motor   Lower Extremity Assessment Lower Extremity Assessment: Overall WFL for tasks assessed   Cervical / Trunk Assessment Cervical / Trunk Assessment: Neck Surgery   Communication Communication Communication: No apparent difficulties   Cognition Arousal: Alert Behavior During Therapy: WFL for tasks assessed/performed Cognition:  No apparent impairments                               Following commands: Intact       Cueing  General Comments   Cueing Techniques: Verbal cues;Visual cues  spouse present   Exercises     Shoulder Instructions      Home Living Family/patient expects to be discharged to:: Private residence Living Arrangements: Spouse/significant other;Other relatives Available Help at Discharge: Family (sister) Type of Home: House Home Access: Ramped entrance      Home Layout: One level     Bathroom Shower/Tub: Producer, Television/film/video: Standard     Home Equipment:  (none)          Prior Functioning/Environment Prior Level of Function : Independent/Modified Independent             Mobility Comments: none PTA; reports she is unable to walk for long distances 2/2 pelvic floor issues, but can walk around the grocery store with a cart ADLs Comments: indep, not working, limited driving    OT Problem List: Decreased strength;Pain;Increased edema   OT Treatment/Interventions:        OT Goals(Current goals can be found in the care plan section)   Acute Rehab OT Goals Patient Stated Goal: get better   OT Frequency:       Co-evaluation              AM-PAC OT 6 Clicks Daily Activity     Outcome Measure Help from another person eating meals?: None Help from another person taking care of personal grooming?: A Little Help from another person toileting, which includes using toliet, bedpan, or urinal?: None Help from another person bathing (including washing, rinsing, drying)?: A Little Help from another person to put on and taking off regular upper body clothing?: None Help from another person to put on and taking off regular lower body clothing?: None 6 Click Score: 22   End of Session Nurse Communication: Mobility status;Patient requests pain meds;Other (comment) (vitals)  Activity Tolerance: Patient tolerated treatment well Patient left: in bed;with family/visitor present;with call bell/phone within reach  OT Visit Diagnosis: Unsteadiness on feet (R26.81);Muscle weakness (generalized) (M62.81)                Time: 9249-9176 OT Time Calculation (min): 33 min Charges:  OT General Charges $OT Visit: 1 Visit OT Evaluation $OT Eval Low Complexity: 1 Low OT Treatments $Self Care/Home Management : 8-22 mins  Jassiel Flye D., MSOT, OTR/L Acute Rehabilitation Services [phone] Secure Chat Preferred  Kendra Parker 03/23/2024, 9:19 AM

## 2024-03-23 NOTE — Progress Notes (Signed)
 Patient alert and oriented, voided, ambulate. Surgical site clean and dry no sign of infection. D/c instructions explain and given to the patient. All questions answered.

## 2024-03-26 ENCOUNTER — Encounter (HOSPITAL_COMMUNITY): Payer: Self-pay | Admitting: Neurosurgery

## 2024-06-04 ENCOUNTER — Other Ambulatory Visit: Payer: Self-pay | Admitting: Neurosurgery

## 2024-06-04 DIAGNOSIS — M47892 Other spondylosis, cervical region: Secondary | ICD-10-CM

## 2024-06-05 ENCOUNTER — Encounter: Payer: Self-pay | Admitting: Neurosurgery

## 2024-06-08 ENCOUNTER — Ambulatory Visit
Admission: RE | Admit: 2024-06-08 | Discharge: 2024-06-08 | Disposition: A | Discharge status: Home / Self Care | Source: Ambulatory Visit | Attending: Neurosurgery | Admitting: Neurosurgery

## 2024-06-08 DIAGNOSIS — M47892 Other spondylosis, cervical region: Secondary | ICD-10-CM

## 2024-06-12 ENCOUNTER — Other Ambulatory Visit: Payer: Self-pay | Admitting: Neurosurgery

## 2024-06-12 DIAGNOSIS — M47892 Other spondylosis, cervical region: Secondary | ICD-10-CM

## 2024-06-13 ENCOUNTER — Encounter: Payer: Self-pay | Admitting: Neurosurgery
